# Patient Record
Sex: Male | Born: 1955
Health system: Southern US, Community
[De-identification: ages and names within clinical notes are randomized; demographics above are authoritative.]

## PROBLEM LIST (undated history)

## (undated) DIAGNOSIS — Z95810 Presence of automatic (implantable) cardiac defibrillator: Secondary | ICD-10-CM

## (undated) DIAGNOSIS — I509 Heart failure, unspecified: Secondary | ICD-10-CM

## (undated) DIAGNOSIS — E785 Hyperlipidemia, unspecified: Secondary | ICD-10-CM

## (undated) HISTORY — PX: CARDIAC CATHETERIZATION: SHX172

---

## 2001-02-17 ENCOUNTER — Ambulatory Visit (HOSPITAL_COMMUNITY): Admission: RE | Admit: 2001-02-17 | Discharge: 2001-02-17 | Payer: Self-pay | Admitting: *Deleted

## 2004-06-09 ENCOUNTER — Ambulatory Visit (HOSPITAL_COMMUNITY): Admission: RE | Admit: 2004-06-09 | Discharge: 2004-06-09 | Payer: Self-pay | Admitting: *Deleted

## 2006-03-02 ENCOUNTER — Emergency Department (HOSPITAL_COMMUNITY): Admission: EM | Admit: 2006-03-02 | Discharge: 2006-03-02 | Payer: Self-pay | Admitting: Emergency Medicine

## 2016-06-02 ENCOUNTER — Emergency Department (HOSPITAL_BASED_OUTPATIENT_CLINIC_OR_DEPARTMENT_OTHER)
Admission: EM | Admit: 2016-06-02 | Discharge: 2016-06-02 | Disposition: A | Discharge status: Home / Self Care | Payer: 59 | Attending: Emergency Medicine | Admitting: Emergency Medicine

## 2016-06-02 ENCOUNTER — Encounter (HOSPITAL_BASED_OUTPATIENT_CLINIC_OR_DEPARTMENT_OTHER): Payer: Self-pay | Admitting: *Deleted

## 2016-06-02 ENCOUNTER — Emergency Department (HOSPITAL_BASED_OUTPATIENT_CLINIC_OR_DEPARTMENT_OTHER): Payer: 59

## 2016-06-02 DIAGNOSIS — Y9389 Activity, other specified: Secondary | ICD-10-CM | POA: Insufficient documentation

## 2016-06-02 DIAGNOSIS — W270XXA Contact with workbench tool, initial encounter: Secondary | ICD-10-CM | POA: Insufficient documentation

## 2016-06-02 DIAGNOSIS — Y999 Unspecified external cause status: Secondary | ICD-10-CM | POA: Insufficient documentation

## 2016-06-02 DIAGNOSIS — IMO0002 Reserved for concepts with insufficient information to code with codable children: Secondary | ICD-10-CM

## 2016-06-02 DIAGNOSIS — Z87891 Personal history of nicotine dependence: Secondary | ICD-10-CM | POA: Diagnosis not present

## 2016-06-02 DIAGNOSIS — Y929 Unspecified place or not applicable: Secondary | ICD-10-CM | POA: Insufficient documentation

## 2016-06-02 DIAGNOSIS — S81812A Laceration without foreign body, left lower leg, initial encounter: Secondary | ICD-10-CM | POA: Diagnosis not present

## 2016-06-02 MED ORDER — TETANUS-DIPHTH-ACELL PERTUSSIS 5-2.5-18.5 LF-MCG/0.5 IM SUSP
0.5000 mL | Freq: Once | INTRAMUSCULAR | Status: AC
  Administered 2016-06-02: 0.5 mL via INTRAMUSCULAR
  Filled 2016-06-02: qty 0.5

## 2016-06-02 MED ORDER — "THROMBI-PAD 3""X3"" EX PADS"
MEDICATED_PAD | CUTANEOUS | Status: AC
  Administered 2016-06-02: 1
  Filled 2016-06-02: qty 1

## 2016-06-02 MED ORDER — GELATIN ABSORBABLE 12-7 MM EX MISC
1.0000 | Freq: Once | CUTANEOUS | Status: AC
  Administered 2016-06-02: 1 via TOPICAL

## 2016-06-02 MED ORDER — LIDOCAINE-EPINEPHRINE 2 %-1:100000 IJ SOLN
30.0000 mL | Freq: Once | INTRAMUSCULAR | Status: AC
  Administered 2016-06-02: 30 mL via INTRADERMAL
  Filled 2016-06-02: qty 2

## 2016-06-02 MED ORDER — GELATIN ABSORBABLE 12-7 MM EX MISC
CUTANEOUS | Status: AC
  Administered 2016-06-02: 14:00:00
  Filled 2016-06-02: qty 1

## 2016-06-02 MED ORDER — CEPHALEXIN 500 MG PO CAPS: 500.0000 mg | ORAL_CAPSULE | Freq: Four times a day (QID) | ORAL | Status: DC

## 2016-06-02 NOTE — ED Notes (Signed)
Pt to triage in w/c, bleeding noted on pants, pants removed at triage, active bleeding noted from dressing to left knee. Pt reports "a glancing blow" to his left knee with an ax approx 20 minutes ago, pt unsure of td status. Leg being cleaned, pressure held at triage. Pt denies any other c/o or injuries.

## 2016-06-02 NOTE — ED Notes (Signed)
MD at bedside. 

## 2016-06-02 NOTE — Discharge Instructions (Signed)

## 2016-06-02 NOTE — ED Provider Notes (Signed)
CSN: [id number]     Arrival date & time 06/02/16  1215 History   First MD Initiated Contact with Patient 06/02/16 1255     Chief Complaint  Patient presents with  . Extremity Laceration     (Consider location/radiation/quality/duration/timing/severity/associated sxs/prior Treatment) HPI Kevontae Burgoon is a 60 y.o. male with no significant PMH who presents with laceration to left anterior shin approximately 12 PM today.  Patient was chopping wood with an ax when it slipped and he struck his shin.  No meds PTA.  Tetanus unknown.  Symptom onset sudden, constant.  Bleeding controlled with pressure.  No numbness, weakness, swelling, or color change.   History reviewed. No pertinent past medical history. History reviewed. No pertinent past surgical history. History reviewed. No pertinent family history. Social History  Substance Use Topics  . Smoking status: Former Games developer  . Smokeless tobacco: None  . Alcohol Use: None    Review of Systems All other systems negative unless otherwise stated in HPI    Allergies  Review of patient's allergies indicates no known allergies.  Home Medications   Prior to Admission medications   Medication Sig Start Date End Date Taking? Authorizing Provider  cephALEXin (KEFLEX) 500 MG capsule Take 1 capsule (500 mg total) by mouth 4 (four) times daily. 06/02/16   Raveena Hebdon, PA-C   BP 146/65 mmHg  Pulse 62  Temp(Src) 98.2 F (36.8 C) (Oral)  Resp 18  Ht  (1.803 m)  Wt 99.791 kg  BMI 30.70 kg/m2  SpO2 98% Physical Exam  Constitutional: He is oriented to person, place, and time. He appears well-developed and well-nourished.  HENT:  Head: Normocephalic and atraumatic.  Eyes: Conjunctivae are normal.  Neck: Normal range of motion.  Cardiovascular:  Pulses:      Dorsalis pedis pulses are 2+ on the right side, and 2+ on the left side.  Capillary refill less than 3 seconds distal to injury.   Pulmonary/Chest: Effort normal. No respiratory  distress.  Abdominal: He exhibits no distension.  Musculoskeletal:  3 cm deep laceration to the muscle just below the lateral anterior knee.  Slow pulsatile arterial bleeding that will stop with pressure.  FAROM of LLE.   Neurological: He is alert and oriented to person, place, and time.  Strength and sensation intact distal to injury.  Skin: Skin is warm and dry.   No foreign bodies visualized or palpated in a bloodless field.     ED Course  Procedures (including critical care time)  LACERATION REPAIR Performed by: Cheri Fowler Consent: Verbal consent obtained. Risks and benefits: risks, benefits and alternatives were discussed Patient identity confirmed: provided demographic data Time out performed prior to procedure Prepped and Draped in normal sterile fashion Wound explored Laceration Location: LLE Laceration Length: 3cm No Foreign Bodies seen or palpated Anesthesia: local infiltration Local anesthetic: lidocaine 1% with epinephrine Anesthetic total: 10 ml Irrigation method: syringe Amount of cleaning: standard Skin closure: staple, 5-0 vicryl Number of sutures or staples: 5 staples, 3 deep simple interrupted  Technique: staple Patient tolerance: Patient tolerated the procedure well with no immediate complications.  Labs Review Labs Reviewed - No data to display  Imaging Review Dg Tibia/fibula Left  06/02/2016  CLINICAL DATA:  Patient with injury to the distal left lower extremity with an ax. Initial encounter. EXAM: LEFT TIBIA AND FIBULA - 2 VIEW COMPARISON:  None. FINDINGS: Normal anatomic alignment. No evidence for acute fracture or dislocation. Soft tissue deformity overlying the proximal tibia and fibula. IMPRESSION: No  acute osseous abnormality. Soft tissue deformity overlying the proximal tibia and fibula anteriorly. Electronically Signed   By: Annia Beltrew  Davis M.D.   On: 06/02/2016 15:16   I have personally reviewed and evaluated these images and lab results as part of  my medical decision-making.   EKG Interpretation None      MDM   Final diagnoses:  Laceration  Patient presents with laceration sustained from ax blow to the left lower extremity. Tetanus was updated. Plain films negative for fracture. Neurovascularly intact. Good pulses. Bleeding appeared to be arterial. Wound irrigated.  Patient was seen by Dr. Fredderick PhenixBelfi as well. Dr. Fredderick PhenixBelfi placed 3 deep sutures to control the bleeding. Once bleeding was stopped, 5 staples were placed. No bleeding observed for 10 minutes status post staple placement. Pressure dressing applied. We will discharge home with Keflex. Return in 10-14 days for staple removal. Recommend elevation of lower extremity.  Discussed return precautions including continued bleeding, swelling, pus, fever, chills, or decreased lower extremity sensation.      Cheri FowlerKayla Amor Hyle, PA-C 06/02/16 1707  Rolan BuccoMelanie Belfi, MD 06/03/16 [phone]

## 2019-08-10 ENCOUNTER — Encounter (HOSPITAL_COMMUNITY): Payer: Self-pay

## 2019-08-10 ENCOUNTER — Encounter (HOSPITAL_COMMUNITY): Payer: Self-pay | Admitting: Emergency Medicine

## 2019-08-10 ENCOUNTER — Other Ambulatory Visit: Payer: Self-pay

## 2019-08-10 ENCOUNTER — Inpatient Hospital Stay (HOSPITAL_COMMUNITY)
Admission: EM | Admit: 2019-08-10 | Discharge: 2019-08-17 | DRG: 233 | Disposition: A | Discharge status: Home / Self Care | Payer: Medicare Other | Attending: Thoracic Surgery (Cardiothoracic Vascular Surgery) | Admitting: Thoracic Surgery (Cardiothoracic Vascular Surgery)

## 2019-08-10 ENCOUNTER — Emergency Department (HOSPITAL_COMMUNITY): Payer: Medicare Other

## 2019-08-10 ENCOUNTER — Ambulatory Visit (HOSPITAL_COMMUNITY): Admit: 2019-08-10 | Payer: 59 | Admitting: Internal Medicine

## 2019-08-10 DIAGNOSIS — E118 Type 2 diabetes mellitus with unspecified complications: Secondary | ICD-10-CM | POA: Diagnosis not present

## 2019-08-10 DIAGNOSIS — Z79899 Other long term (current) drug therapy: Secondary | ICD-10-CM

## 2019-08-10 DIAGNOSIS — Z87891 Personal history of nicotine dependence: Secondary | ICD-10-CM

## 2019-08-10 DIAGNOSIS — I110 Hypertensive heart disease with heart failure: Secondary | ICD-10-CM | POA: Diagnosis not present

## 2019-08-10 DIAGNOSIS — E785 Hyperlipidemia, unspecified: Secondary | ICD-10-CM | POA: Diagnosis not present

## 2019-08-10 DIAGNOSIS — D62 Acute posthemorrhagic anemia: Secondary | ICD-10-CM | POA: Diagnosis not present

## 2019-08-10 DIAGNOSIS — I447 Left bundle-branch block, unspecified: Secondary | ICD-10-CM | POA: Diagnosis not present

## 2019-08-10 DIAGNOSIS — E1165 Type 2 diabetes mellitus with hyperglycemia: Secondary | ICD-10-CM | POA: Diagnosis present

## 2019-08-10 DIAGNOSIS — R072 Precordial pain: Secondary | ICD-10-CM

## 2019-08-10 DIAGNOSIS — I5023 Acute on chronic systolic (congestive) heart failure: Secondary | ICD-10-CM | POA: Diagnosis not present

## 2019-08-10 DIAGNOSIS — E7849 Other hyperlipidemia: Secondary | ICD-10-CM

## 2019-08-10 DIAGNOSIS — I5041 Acute combined systolic (congestive) and diastolic (congestive) heart failure: Secondary | ICD-10-CM | POA: Diagnosis not present

## 2019-08-10 DIAGNOSIS — Z01810 Encounter for preprocedural cardiovascular examination: Secondary | ICD-10-CM | POA: Diagnosis not present

## 2019-08-10 DIAGNOSIS — J90 Pleural effusion, not elsewhere classified: Secondary | ICD-10-CM

## 2019-08-10 DIAGNOSIS — E7800 Pure hypercholesterolemia, unspecified: Secondary | ICD-10-CM | POA: Diagnosis present

## 2019-08-10 DIAGNOSIS — I214 Non-ST elevation (NSTEMI) myocardial infarction: Principal | ICD-10-CM | POA: Diagnosis present

## 2019-08-10 DIAGNOSIS — I25110 Atherosclerotic heart disease of native coronary artery with unstable angina pectoris: Secondary | ICD-10-CM | POA: Diagnosis not present

## 2019-08-10 DIAGNOSIS — Z791 Long term (current) use of non-steroidal anti-inflammatories (NSAID): Secondary | ICD-10-CM | POA: Diagnosis not present

## 2019-08-10 DIAGNOSIS — Z951 Presence of aortocoronary bypass graft: Secondary | ICD-10-CM

## 2019-08-10 DIAGNOSIS — I213 ST elevation (STEMI) myocardial infarction of unspecified site: Secondary | ICD-10-CM | POA: Diagnosis not present

## 2019-08-10 DIAGNOSIS — I255 Ischemic cardiomyopathy: Secondary | ICD-10-CM | POA: Diagnosis not present

## 2019-08-10 DIAGNOSIS — Z20828 Contact with and (suspected) exposure to other viral communicable diseases: Secondary | ICD-10-CM | POA: Diagnosis not present

## 2019-08-10 DIAGNOSIS — I25810 Atherosclerosis of coronary artery bypass graft(s) without angina pectoris: Secondary | ICD-10-CM | POA: Diagnosis present

## 2019-08-10 DIAGNOSIS — Z8249 Family history of ischemic heart disease and other diseases of the circulatory system: Secondary | ICD-10-CM

## 2019-08-10 DIAGNOSIS — I10 Essential (primary) hypertension: Secondary | ICD-10-CM | POA: Diagnosis not present

## 2019-08-10 DIAGNOSIS — R9431 Abnormal electrocardiogram [ECG] [EKG]: Secondary | ICD-10-CM

## 2019-08-10 DIAGNOSIS — J9811 Atelectasis: Secondary | ICD-10-CM | POA: Diagnosis not present

## 2019-08-10 DIAGNOSIS — R079 Chest pain, unspecified: Secondary | ICD-10-CM | POA: Diagnosis present

## 2019-08-10 DIAGNOSIS — I2510 Atherosclerotic heart disease of native coronary artery without angina pectoris: Secondary | ICD-10-CM | POA: Diagnosis present

## 2019-08-10 DIAGNOSIS — Z7984 Long term (current) use of oral hypoglycemic drugs: Secondary | ICD-10-CM | POA: Diagnosis not present

## 2019-08-10 DIAGNOSIS — E1169 Type 2 diabetes mellitus with other specified complication: Secondary | ICD-10-CM | POA: Diagnosis not present

## 2019-08-10 HISTORY — DX: Non-ST elevation (NSTEMI) myocardial infarction: I21.4

## 2019-08-10 HISTORY — DX: Hyperlipidemia, unspecified: E78.5

## 2019-08-10 LAB — CBC
HCT: 45.6 % (ref 39.0–52.0)
Hemoglobin: 15.6 g/dL (ref 13.0–17.0)
MCH: 29.7 pg (ref 26.0–34.0)
MCHC: 34.2 g/dL (ref 30.0–36.0)
MCV: 86.9 fL (ref 80.0–100.0)
Platelets: 241 10*3/uL (ref 150–400)
RBC: 5.25 MIL/uL (ref 4.22–5.81)
RDW: 12.4 % (ref 11.5–15.5)
WBC: 8.4 10*3/uL (ref 4.0–10.5)
nRBC: 0 % (ref 0.0–0.2)

## 2019-08-10 LAB — HEMOGLOBIN A1C
Hgb A1c MFr Bld: 8.1 % — ABNORMAL HIGH (ref 4.8–5.6)
Mean Plasma Glucose: 185.77 mg/dL

## 2019-08-10 LAB — BASIC METABOLIC PANEL
Anion gap: 11 (ref 5–15)
BUN: 19 mg/dL (ref 8–23)
CO2: 19 mmol/L — ABNORMAL LOW (ref 22–32)
Calcium: 8.8 mg/dL — ABNORMAL LOW (ref 8.9–10.3)
Chloride: 104 mmol/L (ref 98–111)
Creatinine, Ser: 1 mg/dL (ref 0.61–1.24)
GFR calc Af Amer: >60 mL/min (ref >60)
GFR calc non Af Amer: >60 mL/min (ref >60)
Glucose, Bld: 335 mg/dL — ABNORMAL HIGH (ref 70–99)
Potassium: 5.4 mmol/L — ABNORMAL HIGH (ref 3.5–5.1)
Sodium: 134 mmol/L — ABNORMAL LOW (ref 135–145)

## 2019-08-10 LAB — TROPONIN I (HIGH SENSITIVITY)
Troponin I (High Sensitivity): 1068 ng/L (ref <18)
Troponin I (High Sensitivity): 1161 ng/L (ref <18)
Troponin I (High Sensitivity): 1357 ng/L (ref <18)

## 2019-08-10 LAB — CBG MONITORING, ED: Glucose-Capillary: 157 mg/dL — ABNORMAL HIGH (ref 70–99)

## 2019-08-10 SURGERY — LEFT HEART CATH AND CORONARY ANGIOGRAPHY
Anesthesia: LOCAL

## 2019-08-10 MED ORDER — ASPIRIN 81 MG PO CHEW
81.0000 mg | CHEWABLE_TABLET | ORAL | Status: AC
  Administered 2019-08-11: 07:00:00 81 mg via ORAL
  Filled 2019-08-10: qty 1

## 2019-08-10 MED ORDER — CARVEDILOL 3.125 MG PO TABS
3.1250 mg | ORAL_TABLET | Freq: Two times a day (BID) | ORAL | Status: DC
  Administered 2019-08-10: 3.125 mg via ORAL
  Filled 2019-08-10 (×2): qty 1

## 2019-08-10 MED ORDER — NITROGLYCERIN 0.4 MG SL SUBL: 0.4000 mg | SUBLINGUAL_TABLET | SUBLINGUAL | Status: DC | PRN

## 2019-08-10 MED ORDER — CARVEDILOL 3.125 MG PO TABS
3.1250 mg | ORAL_TABLET | Freq: Two times a day (BID) | ORAL | Status: DC
  Filled 2019-08-10: qty 1

## 2019-08-10 MED ORDER — SODIUM CHLORIDE 0.9 % WEIGHT BASED INFUSION
1.0000 mL/kg/h | INTRAVENOUS | Status: DC
  Administered 2019-08-11: 1 mL/kg/h via INTRAVENOUS

## 2019-08-10 MED ORDER — SODIUM CHLORIDE 0.9% FLUSH
3.0000 mL | Freq: Once | INTRAVENOUS | Status: AC
  Administered 2019-08-10: 16:00:00 3 mL via INTRAVENOUS

## 2019-08-10 MED ORDER — INSULIN ASPART 100 UNIT/ML ~~LOC~~ SOLN: 0.0000 [IU] | Freq: Three times a day (TID) | SUBCUTANEOUS | Status: DC

## 2019-08-10 MED ORDER — HEPARIN (PORCINE) 25000 UT/250ML-% IV SOLN
1700.0000 [IU]/h | INTRAVENOUS | Status: DC
  Administered 2019-08-10: 1300 [IU]/h via INTRAVENOUS
  Filled 2019-08-10 (×2): qty 250

## 2019-08-10 MED ORDER — VERAPAMIL HCL 2.5 MG/ML IV SOLN
INTRAVENOUS | Status: AC
  Filled 2019-08-10: qty 2

## 2019-08-10 MED ORDER — SODIUM CHLORIDE 0.9 % IV SOLN: 250.0000 mL | INTRAVENOUS | Status: DC | PRN

## 2019-08-10 MED ORDER — SODIUM CHLORIDE 0.9% FLUSH: 3.0000 mL | INTRAVENOUS | Status: DC | PRN

## 2019-08-10 MED ORDER — SODIUM CHLORIDE 0.9 % WEIGHT BASED INFUSION
3.0000 mL/kg/h | INTRAVENOUS | Status: DC
  Administered 2019-08-11: 3 mL/kg/h via INTRAVENOUS

## 2019-08-10 MED ORDER — ONDANSETRON HCL 4 MG/2ML IJ SOLN: 4.0000 mg | Freq: Four times a day (QID) | INTRAMUSCULAR | Status: DC | PRN

## 2019-08-10 MED ORDER — ASPIRIN EC 81 MG PO TBEC: 81.0000 mg | DELAYED_RELEASE_TABLET | Freq: Every day | ORAL | Status: DC

## 2019-08-10 MED ORDER — INSULIN ASPART 100 UNIT/ML ~~LOC~~ SOLN
0.0000 [IU] | Freq: Three times a day (TID) | SUBCUTANEOUS | Status: DC
  Administered 2019-08-10: 3 [IU] via SUBCUTANEOUS

## 2019-08-10 MED ORDER — SODIUM CHLORIDE 0.9% FLUSH
3.0000 mL | Freq: Two times a day (BID) | INTRAVENOUS | Status: DC
  Administered 2019-08-10: 22:00:00 3 mL via INTRAVENOUS

## 2019-08-10 MED ORDER — ACETAMINOPHEN 325 MG PO TABS: 650.0000 mg | ORAL_TABLET | ORAL | Status: DC | PRN

## 2019-08-10 MED ORDER — ATORVASTATIN CALCIUM 80 MG PO TABS: 80.0000 mg | ORAL_TABLET | Freq: Every day | ORAL | Status: DC

## 2019-08-10 MED ORDER — HEPARIN (PORCINE) IN NACL 1000-0.9 UT/500ML-% IV SOLN
INTRAVENOUS | Status: AC
  Filled 2019-08-10: qty 1000

## 2019-08-10 MED ORDER — HEPARIN BOLUS VIA INFUSION
4000.0000 [IU] | Freq: Once | INTRAVENOUS | Status: AC
  Administered 2019-08-10: 4000 [IU] via INTRAVENOUS
  Filled 2019-08-10: qty 4000

## 2019-08-10 MED ORDER — LIDOCAINE HCL (PF) 1 % IJ SOLN
INTRAMUSCULAR | Status: AC
  Filled 2019-08-10: qty 30

## 2019-08-10 MED ORDER — ATORVASTATIN CALCIUM 80 MG PO TABS
80.0000 mg | ORAL_TABLET | Freq: Every day | ORAL | Status: DC
  Administered 2019-08-10: 80 mg via ORAL
  Filled 2019-08-10: qty 1

## 2019-08-10 NOTE — Progress Notes (Signed)
Anthony Alvarez arrived to the ED as a STEMI and was down graded. I spoke with [facility] and he said he does not attend church. His wife was on the way to the ED. Samit declined spiritual care at that time.   Wife: [name] Eunice Resident Evelene Croon M.Div. Pager # [phone]

## 2019-08-10 NOTE — ED Triage Notes (Signed)
Pt arrives to ED from Urgent Care with [facility] as a Code STEMI with complaints of on going chest pain for the last couple of months. Patients EKG showed inferior elevation and CODE STEMI was called. On arrival to ED Trauma A patient was chest pain free with no complaints. Patient received 324 Asprin and x1 nitro.

## 2019-08-10 NOTE — Progress Notes (Signed)
ANTICOAGULATION CONSULT NOTE - Initial Consult  Pharmacy Consult for Heparin Indication: chest pain/ACS  No Known Allergies  Patient Measurements: Weight: 225 lb (102.1 kg)   Vital Signs: Temp: 99.6 F (37.6 C) (08/24 1547) Temp Source: Oral (08/24 1547) BP: 136/75 (08/24 1600) Pulse Rate: 91 (08/24 1600)  Labs: Recent Labs    08/10/19 1550  HGB 15.6  HCT 45.6  PLT 241  CREATININE 1.00  TROPONINIHS 1,068*    CrCl cannot be calculated (Unknown ideal weight.).   Medical History: History reviewed. No pertinent past medical history.  Assessment: 63 year old patient with a history of hypercholesterolemia presents for evaluation of chest pain. Initial onset of pain was less than one hour ago.  Pharmacy consulted to start heparin drip.  Patient was not on any anticoagulation PTA.  Goal of Therapy:  Heparin level 0.3-0.7 units/ml Monitor platelets by anticoagulation protocol: Yes   Plan:  Give 4000 units bolus x 1 Start heparin infusion at 1300 units/hr Check anti-Xa level in 6 hours and daily while on heparin Continue to monitor H&H and platelets  Alanda Slim, PharmD, [facility] Clinical Pharmacist Please see AMION for all Pharmacists' Contact Phone Numbers 08/10/2019, 5:15 PM

## 2019-08-10 NOTE — ED Notes (Signed)
MD Ellyn Hack, Cardiologist arrived into room
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
## 2019-08-11 ENCOUNTER — Encounter (HOSPITAL_COMMUNITY): Payer: Self-pay | Admitting: Cardiology

## 2019-08-11 ENCOUNTER — Other Ambulatory Visit: Payer: Self-pay | Admitting: *Deleted

## 2019-08-11 ENCOUNTER — Encounter (HOSPITAL_COMMUNITY)
Admission: EM | Disposition: A | Discharge status: Home / Self Care | Payer: Self-pay | Source: Home / Self Care | Attending: Cardiology

## 2019-08-11 ENCOUNTER — Inpatient Hospital Stay (HOSPITAL_COMMUNITY): Payer: Medicare Other

## 2019-08-11 DIAGNOSIS — I2510 Atherosclerotic heart disease of native coronary artery without angina pectoris: Secondary | ICD-10-CM

## 2019-08-11 DIAGNOSIS — I213 ST elevation (STEMI) myocardial infarction of unspecified site: Secondary | ICD-10-CM

## 2019-08-11 DIAGNOSIS — I25110 Atherosclerotic heart disease of native coronary artery with unstable angina pectoris: Secondary | ICD-10-CM

## 2019-08-11 HISTORY — PX: TRANSTHORACIC ECHOCARDIOGRAM: SHX275

## 2019-08-11 HISTORY — PX: LEFT HEART CATH AND CORONARY ANGIOGRAPHY: CATH118249

## 2019-08-11 LAB — SARS CORONAVIRUS 2 (TAT 6-24 HRS): SARS Coronavirus 2: NEGATIVE

## 2019-08-11 LAB — BASIC METABOLIC PANEL
Anion gap: 7 (ref 5–15)
BUN: 16 mg/dL (ref 8–23)
CO2: 22 mmol/L (ref 22–32)
Calcium: 8.5 mg/dL — ABNORMAL LOW (ref 8.9–10.3)
Chloride: 107 mmol/L (ref 98–111)
Creatinine, Ser: 1.03 mg/dL (ref 0.61–1.24)
GFR calc Af Amer: >60 mL/min (ref >60)
GFR calc non Af Amer: >60 mL/min (ref >60)
Glucose, Bld: 168 mg/dL — ABNORMAL HIGH (ref 70–99)
Potassium: 4 mmol/L (ref 3.5–5.1)
Sodium: 136 mmol/L (ref 135–145)

## 2019-08-11 LAB — CBC
HCT: 44 % (ref 39.0–52.0)
Hemoglobin: 14.9 g/dL (ref 13.0–17.0)
MCH: 30 pg (ref 26.0–34.0)
MCHC: 33.9 g/dL (ref 30.0–36.0)
MCV: 88.5 fL (ref 80.0–100.0)
Platelets: 258 10*3/uL (ref 150–400)
RBC: 4.97 MIL/uL (ref 4.22–5.81)
RDW: 12.5 % (ref 11.5–15.5)
WBC: 10.4 10*3/uL (ref 4.0–10.5)
nRBC: 0 % (ref 0.0–0.2)

## 2019-08-11 LAB — HIV ANTIBODY (ROUTINE TESTING W REFLEX): HIV Screen 4th Generation wRfx: NONREACTIVE

## 2019-08-11 LAB — ECHOCARDIOGRAM COMPLETE
Height: 71 in
Weight: 3601.43 oz

## 2019-08-11 LAB — LIPID PANEL
Cholesterol: 124 mg/dL (ref 0–200)
HDL: 21 mg/dL — ABNORMAL LOW (ref >40)
LDL Cholesterol: 54 mg/dL (ref 0–99)
Total CHOL/HDL Ratio: 5.9 RATIO
Triglycerides: 246 mg/dL — ABNORMAL HIGH (ref <150)
VLDL: 49 mg/dL — ABNORMAL HIGH (ref 0–40)

## 2019-08-11 LAB — HEPARIN LEVEL (UNFRACTIONATED): Heparin Unfractionated: 0.1 IU/mL — ABNORMAL LOW (ref 0.30–0.70)

## 2019-08-11 LAB — GLUCOSE, CAPILLARY
Glucose-Capillary: 161 mg/dL — ABNORMAL HIGH (ref 70–99)
Glucose-Capillary: 132 mg/dL — ABNORMAL HIGH (ref 70–99)
Glucose-Capillary: 142 mg/dL — ABNORMAL HIGH (ref 70–99)
Glucose-Capillary: 159 mg/dL — ABNORMAL HIGH (ref 70–99)
Glucose-Capillary: 120 mg/dL — ABNORMAL HIGH (ref 70–99)

## 2019-08-11 LAB — TROPONIN I (HIGH SENSITIVITY): Troponin I (High Sensitivity): 1365 ng/L (ref <18)

## 2019-08-11 LAB — NOVEL CORONAVIRUS, NAA (HOSP ORDER, SEND-OUT TO REF LAB; TAT 18-24 HRS): SARS-CoV-2, NAA: NOT DETECTED

## 2019-08-11 SURGERY — LEFT HEART CATH AND CORONARY ANGIOGRAPHY
Anesthesia: LOCAL

## 2019-08-11 MED ORDER — SODIUM CHLORIDE 0.9 % WEIGHT BASED INFUSION: 1.0000 mL/kg/h | INTRAVENOUS | Status: AC

## 2019-08-11 MED ORDER — SODIUM CHLORIDE 0.9% FLUSH
3.0000 mL | Freq: Two times a day (BID) | INTRAVENOUS | Status: DC
  Administered 2019-08-12: 22:00:00 3 mL via INTRAVENOUS

## 2019-08-11 MED ORDER — LIVING WELL WITH DIABETES BOOK
Freq: Once | Status: DC
  Filled 2019-08-11: qty 1

## 2019-08-11 MED ORDER — IOHEXOL 350 MG/ML SOLN
INTRAVENOUS | Status: DC | PRN
  Administered 2019-08-11: 08:00:00 90 mL via INTRA_ARTERIAL

## 2019-08-11 MED ORDER — LIDOCAINE HCL (PF) 1 % IJ SOLN
INTRAMUSCULAR | Status: DC | PRN
  Administered 2019-08-11: 2 mL

## 2019-08-11 MED ORDER — HEPARIN (PORCINE) IN NACL 1000-0.9 UT/500ML-% IV SOLN
INTRAVENOUS | Status: AC
  Filled 2019-08-11: qty 1000

## 2019-08-11 MED ORDER — HEPARIN (PORCINE) IN NACL 1000-0.9 UT/500ML-% IV SOLN
INTRAVENOUS | Status: DC | PRN
  Administered 2019-08-11 (×2): 500 mL

## 2019-08-11 MED ORDER — METOPROLOL TARTRATE 12.5 MG HALF TABLET
12.5000 mg | ORAL_TABLET | Freq: Two times a day (BID) | ORAL | Status: DC
  Administered 2019-08-11 – 2019-08-12 (×4): 12.5 mg via ORAL
  Filled 2019-08-11 (×4): qty 1

## 2019-08-11 MED ORDER — VERAPAMIL HCL 2.5 MG/ML IV SOLN
INTRAVENOUS | Status: AC
  Filled 2019-08-11: qty 2

## 2019-08-11 MED ORDER — MIDAZOLAM HCL 2 MG/2ML IJ SOLN
INTRAMUSCULAR | Status: AC
  Filled 2019-08-11: qty 2

## 2019-08-11 MED ORDER — FENTANYL CITRATE (PF) 100 MCG/2ML IJ SOLN
INTRAMUSCULAR | Status: AC
  Filled 2019-08-11: qty 2

## 2019-08-11 MED ORDER — VERAPAMIL HCL 2.5 MG/ML IV SOLN
INTRAVENOUS | Status: DC | PRN
  Administered 2019-08-11: 08:00:00 10 mL via INTRA_ARTERIAL

## 2019-08-11 MED ORDER — SODIUM CHLORIDE 0.9% FLUSH: 3.0000 mL | INTRAVENOUS | Status: DC | PRN

## 2019-08-11 MED ORDER — ACETAMINOPHEN 325 MG PO TABS
650.0000 mg | ORAL_TABLET | ORAL | Status: DC | PRN
  Administered 2019-08-11 – 2019-08-12 (×2): 650 mg via ORAL
  Filled 2019-08-11 (×2): qty 2

## 2019-08-11 MED ORDER — INSULIN ASPART 100 UNIT/ML ~~LOC~~ SOLN
0.0000 [IU] | Freq: Three times a day (TID) | SUBCUTANEOUS | Status: DC
  Administered 2019-08-11: 2 [IU] via SUBCUTANEOUS
  Administered 2019-08-11: 3 [IU] via SUBCUTANEOUS
  Administered 2019-08-12: 2 [IU] via SUBCUTANEOUS

## 2019-08-11 MED ORDER — ONDANSETRON HCL 4 MG/2ML IJ SOLN: 4.0000 mg | Freq: Four times a day (QID) | INTRAMUSCULAR | Status: DC | PRN

## 2019-08-11 MED ORDER — HEPARIN SODIUM (PORCINE) 1000 UNIT/ML IJ SOLN
INTRAMUSCULAR | Status: DC | PRN
  Administered 2019-08-11: 5000 [IU] via INTRAVENOUS

## 2019-08-11 MED ORDER — ATORVASTATIN CALCIUM 80 MG PO TABS
80.0000 mg | ORAL_TABLET | Freq: Every day | ORAL | Status: DC
  Administered 2019-08-11 – 2019-08-16 (×5): 80 mg via ORAL
  Filled 2019-08-11 (×5): qty 1

## 2019-08-11 MED ORDER — ASPIRIN 81 MG PO CHEW
81.0000 mg | CHEWABLE_TABLET | Freq: Every day | ORAL | Status: DC
  Administered 2019-08-12: 81 mg via ORAL
  Filled 2019-08-11: qty 1

## 2019-08-11 MED ORDER — HEPARIN BOLUS VIA INFUSION
3000.0000 [IU] | Freq: Once | INTRAVENOUS | Status: AC
  Administered 2019-08-11: 3000 [IU] via INTRAVENOUS
  Filled 2019-08-11: qty 3000

## 2019-08-11 MED ORDER — LIDOCAINE HCL (PF) 1 % IJ SOLN
INTRAMUSCULAR | Status: AC
  Filled 2019-08-11: qty 30

## 2019-08-11 MED ORDER — HEPARIN (PORCINE) 25000 UT/250ML-% IV SOLN
2000.0000 [IU]/h | INTRAVENOUS | Status: DC
  Administered 2019-08-11: 1800 [IU]/h via INTRAVENOUS
  Administered 2019-08-12 (×2): 2000 [IU]/h via INTRAVENOUS
  Filled 2019-08-11 (×4): qty 250

## 2019-08-11 MED ORDER — FENTANYL CITRATE (PF) 100 MCG/2ML IJ SOLN
INTRAMUSCULAR | Status: DC | PRN
  Administered 2019-08-11 (×2): 25 ug via INTRAVENOUS

## 2019-08-11 MED ORDER — FAMOTIDINE 20 MG PO TABS
20.0000 mg | ORAL_TABLET | Freq: Once | ORAL | Status: AC
  Administered 2019-08-11: 20 mg via ORAL
  Filled 2019-08-11: qty 1

## 2019-08-11 MED ORDER — HEPARIN SODIUM (PORCINE) 1000 UNIT/ML IJ SOLN
INTRAMUSCULAR | Status: AC
  Filled 2019-08-11: qty 1

## 2019-08-11 MED ORDER — INSULIN ASPART 100 UNIT/ML ~~LOC~~ SOLN: 0.0000 [IU] | Freq: Three times a day (TID) | SUBCUTANEOUS | Status: DC

## 2019-08-11 MED ORDER — MIDAZOLAM HCL 2 MG/2ML IJ SOLN
INTRAMUSCULAR | Status: DC | PRN
  Administered 2019-08-11 (×2): 1 mg via INTRAVENOUS

## 2019-08-11 MED ORDER — SODIUM CHLORIDE 0.9 % IV SOLN: 250.0000 mL | INTRAVENOUS | Status: DC | PRN

## 2019-08-11 SURGICAL SUPPLY — 10 items
CATH 5FR JL3.5 JR4 ANG PIG MP (CATHETERS) ×2 IMPLANT
DEVICE RAD COMP TR BAND LRG (VASCULAR PRODUCTS) ×2 IMPLANT
GUIDEWIRE INQWIRE 1.5J.035X260 (WIRE) ×1 IMPLANT
INQWIRE 1.5J .035X260CM (WIRE) ×2
KIT HEART LEFT (KITS) ×2 IMPLANT
PACK CARDIAC CATHETERIZATION (CUSTOM PROCEDURE TRAY) ×2 IMPLANT
SHEATH RAIN RADIAL 21G 6FR (SHEATH) ×2 IMPLANT
SYR MEDRAD MARK 7 150ML (SYRINGE) ×2 IMPLANT
TRANSDUCER W/STOPCOCK (MISCELLANEOUS) ×2 IMPLANT
TUBING CIL FLEX 10 FLL-RA (TUBING) ×2 IMPLANT

## 2019-08-11 NOTE — Progress Notes (Signed)
TCTS consulted for CABG evaluation. °

## 2019-08-11 NOTE — ED Notes (Signed)
Patient denies pain and is resting comfortably.  

## 2019-08-11 NOTE — Consult Note (Signed)
[address]       [place] [id number]             [phone]        [facility] Medical Record #[id number] Date of Birth: [birth year]  Referring: No ref. provider found Primary Care: Daisy Florooss, Charles Alan, MD Primary Cardiologist:No primary care provider on file.  Chief Complaint:    Chief Complaint  Patient presents with  . Code STEMI    History of Present Illness:     63 yo male admitted from the ED with chest pain.  He underwent a LHC which shows 2V CAD.  CTS was consulted to assist with management.  He began noticing symptoms in January of this year.  He has had progressive dyspnea with exertion over the last several months.  His first episode of chest pain was about 3-4 weeks ago.     Past Medical and Surgical History: Previous Chest Surgery: no Previous Chest Radiation: no Diabetes Mellitus: yes.  HbA1C 8.1 Creatinine: 1.03  Past Medical History:  Diagnosis Date  . HLD (hyperlipidemia)     Past Surgical History:  Procedure Laterality Date  . LEFT HEART CATH AND CORONARY ANGIOGRAPHY N/A 08/11/2019   Procedure: LEFT HEART CATH AND CORONARY ANGIOGRAPHY;  Surgeon: SwazilandJordan, [name], MD;  [facility];  Service: Cardiovascular;  Laterality: N/A;    Social History: Support: lives with wife.  Active lifestyle  Social History   Tobacco Use  Smoking Status Former Smoker    Social History   Substance and Sexual Activity  Alcohol Use None     No Known Allergies  Medications: Asprin: yes Statin: yes Beta Blocker: yes Ace Inhibitor: no Anti-Coagulation: no  Current Facility-Administered Medications  Medication Dose Route Frequency Provider Last Rate Last Dose  . 0.9 %  sodium chloride infusion  250 mL Intravenous PRN SwazilandJordan, [name], MD      . 0.9% sodium chloride infusion  1 mL/kg/hr Intravenous Continuous SwazilandJordan, [name], MD      . acetaminophen (TYLENOL) tablet 650 mg  650 mg Oral Q4H PRN SwazilandJordan, [name],  MD      . aspirin chewable tablet 81 mg  81 mg Oral Daily SwazilandJordan, [name], MD      . atorvastatin (LIPITOR) tablet 80 mg  80 mg Oral q1800 SwazilandJordan, [name], MD      . heparin ADULT infusion 100 units/mL (25000 units/23650mL sodium chloride 0.45%)  1,800 Units/hr Intravenous Continuous Marykay LexHarding, David W, MD      . living well with diabetes book MISC   Does not apply Once Marykay LexHarding, David W, MD      . metoprolol tartrate (LOPRESSOR) tablet 12.5 mg  12.5 mg Oral BID SwazilandJordan, [name], MD      . ondansetron [facility](ZOFRAN) injection 4 mg  4 mg Intravenous Q6H PRN SwazilandJordan, [name], MD      . sodium chloride flush (NS) 0.9 % injection 3 mL  3 mL Intravenous Q12H SwazilandJordan, [name], MD      . sodium chloride flush (NS) 0.9 % injection 3 mL  3 mL Intravenous PRN SwazilandJordan, [name], MD        Medications Prior to Admission  Medication Sig Dispense Refill Last Dose  . acetaminophen (TYLENOL) 500 MG tablet Take 500-1,000 mg by mouth every 8 (eight) hours as needed (for pain).    08/10/2019 at am  . ibuprofen (ADVIL) 200 MG tablet  Take 200-400 mg by mouth every 8 (eight) hours as needed (for pain).    08/09/2019 at Unknown time    History reviewed. No pertinent family history.   Review of Systems:   Review of Systems  Constitutional: Positive for malaise/fatigue. Negative for chills, fever and weight loss.  HENT: Negative.   Eyes: Negative.   Respiratory: Positive for shortness of breath. Negative for cough and hemoptysis.   Cardiovascular: Positive for chest pain. Negative for palpitations, orthopnea, claudication, leg swelling and PND.  Gastrointestinal: Positive for diarrhea and heartburn.  Musculoskeletal: Positive for myalgias.  Skin: Negative.   Neurological: Negative.       Physical Exam: BP 126/75 (BP Location: Left Arm)   Pulse 61   Temp 98.5 F (36.9 C) (Oral)   Resp 10   Ht 5\' 11"  (1.803 m)   Wt 102.1 kg   SpO2 98%   BMI 31.39 kg/m  Physical Exam  Constitutional: He is oriented to person,  place, and time and well-developed, well-nourished, and in no distress. No distress.  HENT:  Head: Normocephalic and atraumatic.  Eyes: Conjunctivae and EOM are normal. No scleral icterus.  Neck: Normal range of motion. No tracheal deviation present.  Cardiovascular: Normal rate and regular rhythm.  No murmur heard. Pulmonary/Chest: Effort normal and breath sounds normal. No respiratory distress.  Abdominal: Soft. He exhibits no distension.  Musculoskeletal: Normal range of motion.        General: No edema.  Neurological: He is alert and oriented to person, place, and time.  Skin: Skin is warm and dry. He is not diaphoretic.      Diagnostic Studies & Laboratory data:    Left Heart Catherization:  Diagnostic Dominance: Right   CTO LAD, with LL colaterals Mid RCA and PLV lesion  Echo: pending   I have independently reviewed the above radiologic studies and discussed with the patient   Recent Lab Findings: Lab Results  Component Value Date   WBC 10.4 08/11/2019   HGB 14.9 08/11/2019   HCT 44.0 08/11/2019   PLT 258 08/11/2019   GLUCOSE 168 (H) 08/11/2019   CHOL 124 08/11/2019   TRIG 246 (H) 08/11/2019   HDL 21 (L) 08/11/2019   LDLCALC 54 08/11/2019   NA 136 08/11/2019   K 4.0 08/11/2019   CL 107 08/11/2019   CREATININE 1.03 08/11/2019   BUN 16 08/11/2019   CO2 22 08/11/2019   HGBA1C 8.1 (H) 08/10/2019      Assessment / Plan:   63 yo male with 2V CAD NSTEMI.  EF 40%.  No significant valvular disease  Will plan for surgery on 8/27.  CABG 2-3.  Risks, benefits, and alternatives discussed.       I  spent 30 minutes counseling the patient face to face.   [name] 08/11/2019 12:02 PM

## 2019-08-11 NOTE — ED Notes (Signed)
Pt placed in hospital bed

## 2019-08-11 NOTE — Interval H&P Note (Signed)
History and Physical Interval Note:  08/11/2019 7:56 AM  Anthony Alvarez  has presented today for surgery, with the diagnosis of n stemi.  The various methods of treatment have been discussed with the patient and family. After consideration of risks, benefits and other options for treatment, the patient has consented to  Procedure(s): LEFT HEART CATH AND CORONARY ANGIOGRAPHY (N/A) as a surgical intervention.  The patient's history has been reviewed, patient examined, no change in status, stable for surgery.  I have reviewed the patient's chart and labs.  Questions were answered to the patient's satisfaction.   Cath Lab Visit (complete for each Cath Lab visit)  Clinical Evaluation Leading to the Procedure:   ACS: Yes.    Non-ACS:    Anginal Classification: CCS IV  Anti-ischemic medical therapy: No Therapy  Non-Invasive Test Results: No non-invasive testing performed  Prior CABG: No previous CABG        Saiya Crist [name] MD,FACC 08/11/2019 7:57 AM

## 2019-08-11 NOTE — Progress Notes (Signed)
  Echocardiogram 2D Echocardiogram has been performed.  Anthony Alvarez M 08/11/2019, 12:46 PM

## 2019-08-11 NOTE — Progress Notes (Signed)
ANTICOAGULATION CONSULT NOTE - Follow Up Consult  Pharmacy Consult for heparin Indication: NSTEMI  Labs: Recent Labs    08/10/19 1550 08/10/19 1750 08/10/19 2221 08/11/19 0016 08/11/19 0421  HGB 15.6  --   --   --  14.9  HCT 45.6  --   --   --  44.0  PLT 241  --   --   --  258  HEPARINUNFRC  --   --   --  0.10*  --   CREATININE 1.00  --   --   --  1.03  TROPONINIHS 1,068* 1,161* 1,357* 1,365*  --     Assessment: 63yo male s/p cath to resume heparin 8 hours post sheath removal CABG consult planned  Goal of Therapy:  Heparin level 0.3-0.7 units/ml   Plan:  Heparin at 1800 units / hr starting at 16:30 pm 8 hour heparin  Daily heparin level, CBC  Thank you Anette Guarneri, PharmD [phone] 08/11/2019,9:16 AM

## 2019-08-11 NOTE — Progress Notes (Signed)
ANTICOAGULATION CONSULT NOTE - Follow Up Consult  Pharmacy Consult for heparin Indication: NSTEMI  Labs: Recent Labs    08/10/19 1550 08/10/19 1750 08/10/19 2221 08/11/19 0016  HGB 15.6  --   --   --   HCT 45.6  --   --   --   PLT 241  --   --   --   HEPARINUNFRC  --   --   --  0.10*  CREATININE 1.00  --   --   --   TROPONINIHS 1,068* 1,161* 1,357*  --     Assessment: 63yo male subtherapeutic on heparin with initial dosing for NSTEMI; no gtt issues or signs of bleeding per RN.  Goal of Therapy:  Heparin level 0.3-0.7 units/ml   Plan:  Will rebolus with heparin 3000 units and increase heparin gtt by 4 units/kg/hr to 1700 units/hr and check level in 6 hours.    Wynona Neat, PharmD, BCPS  08/11/2019,12:56 AM

## 2019-08-11 NOTE — ED Notes (Signed)
Paged Dr Ellyn Hack @ 5:35 am

## 2019-08-11 NOTE — ED Notes (Signed)
Pt asked for something for his "heart burn".  Pt states this is the same heart burn he has several times a week.  Nothing ordered for indigestion.  Paged provider to request orders.

## 2019-08-11 NOTE — Progress Notes (Addendum)
Inpatient Diabetes Program Recommendations  AACE/ADA: New Consensus Statement on Inpatient Glycemic Control (2015)  Target Ranges:  Prepandial:   less than 140 mg/dL      Peak postprandial:   less than 180 mg/dL (1-2 hours)      Critically ill patients:  140 - 180 mg/dL   Lab Results  Component Value Date   GLUCAP 161 (H) 08/11/2019   HGBA1C 8.1 (H) 08/10/2019    Review of Glycemic Control  Diabetes history: None, New Diagnosis this admission  Current orders for Inpatient glycemic control: None  Inpatient Diabetes Program Recommendations:    A1c 8.1% meets criteria for New Diabetes Diagnosis. Will speak with patient today. Will likely benefit from SGLT 2 on discharge will need CM for benefits check on what is covered by insurance.  Will speak with patient today.  Consider Novolog 0-9 units tid + Novolog 0-5 units qhs for glucose control prior to surgery.  Addendum 1200 pm  Spoke with patient and wife at bedside regarding New DM diagnosis and A1c level of 8.1%. Discussed A1C results and explained what an A1C is. Discussed basic pathophysiology of DM Type 2, basic home care, importance of checking CBGs and maintaining good CBG control to prevent long-term and short-term complications. Briefly discussed impact of nutrition, exercise, stress, sickness, and medications on diabetes control.   Asked patient to check his glucose 2 times per day (fasting and alternating second check) and to keep a log book of glucose readings and insulin taken. Explained how the doctor he follows up with can use the log book to continue to make medication adjustments if needed.   RNs to provide ongoing basic DM education at bedside with this patient and engage patient to actively check blood glucose after surgery.   Will follow patient and see again closer to discharge.  Thanks,  Tama Headings RN, MSN, BC-ADM Inpatient Diabetes Coordinator Team Pager [phone] (8a-5p)

## 2019-08-12 ENCOUNTER — Inpatient Hospital Stay (HOSPITAL_COMMUNITY): Payer: Medicare Other

## 2019-08-12 DIAGNOSIS — I25110 Atherosclerotic heart disease of native coronary artery with unstable angina pectoris: Secondary | ICD-10-CM

## 2019-08-12 DIAGNOSIS — I255 Ischemic cardiomyopathy: Secondary | ICD-10-CM

## 2019-08-12 DIAGNOSIS — E118 Type 2 diabetes mellitus with unspecified complications: Secondary | ICD-10-CM

## 2019-08-12 DIAGNOSIS — I25810 Atherosclerosis of coronary artery bypass graft(s) without angina pectoris: Secondary | ICD-10-CM

## 2019-08-12 DIAGNOSIS — I5041 Acute combined systolic (congestive) and diastolic (congestive) heart failure: Secondary | ICD-10-CM

## 2019-08-12 DIAGNOSIS — Z01810 Encounter for preprocedural cardiovascular examination: Secondary | ICD-10-CM

## 2019-08-12 DIAGNOSIS — E785 Hyperlipidemia, unspecified: Secondary | ICD-10-CM

## 2019-08-12 DIAGNOSIS — E1169 Type 2 diabetes mellitus with other specified complication: Secondary | ICD-10-CM

## 2019-08-12 DIAGNOSIS — I2510 Atherosclerotic heart disease of native coronary artery without angina pectoris: Secondary | ICD-10-CM | POA: Diagnosis present

## 2019-08-12 HISTORY — DX: Atherosclerosis of coronary artery bypass graft(s) without angina pectoris: I25.810

## 2019-08-12 HISTORY — DX: Atherosclerotic heart disease of native coronary artery with unstable angina pectoris: I25.110

## 2019-08-12 LAB — PULMONARY FUNCTION TEST
DL/VA % pred: 87 %
DL/VA: 3.6 ml/min/mmHg/L
DLCO cor % pred: 73 %
DLCO cor: 19.78 ml/min/mmHg
DLCO unc % pred: 76 %
DLCO unc: 20.36 ml/min/mmHg
FEF 25-75 Post: 4.57 L/sec
FEF 25-75 Pre: 3.09 L/sec
FEF2575-%Change-Post: 48 %
FEF2575-%Pred-Post: 167 %
FEF2575-%Pred-Pre: 113 %
FEV1-%Change-Post: 4 %
FEV1-%Pred-Post: 95 %
FEV1-%Pred-Pre: 90 %
FEV1-Post: 3.27 L
FEV1-Pre: 3.12 L
FEV1FVC-%Change-Post: 6 %
FEV1FVC-%Pred-Pre: 110 %
FEV6-%Change-Post: -1 %
FEV6-%Pred-Post: 84 %
FEV6-%Pred-Pre: 86 %
FEV6-Post: 3.71 L
FEV6-Pre: 3.79 L
FEV6FVC-%Pred-Post: 105 %
FEV6FVC-%Pred-Pre: 105 %
FVC-%Change-Post: -1 %
FVC-%Pred-Post: 80 %
FVC-%Pred-Pre: 82 %
FVC-Post: 3.71 L
FVC-Pre: 3.79 L
Post FEV1/FVC ratio: 88 %
Post FEV6/FVC ratio: 100 %
Pre FEV1/FVC ratio: 82 %
Pre FEV6/FVC Ratio: 100 %
RV % pred: 74 %
RV: 1.74 L
TLC % pred: 82 %
TLC: 5.81 L

## 2019-08-12 LAB — HEPARIN LEVEL (UNFRACTIONATED)
Heparin Unfractionated: 0.26 IU/mL — ABNORMAL LOW (ref 0.30–0.70)
Heparin Unfractionated: 0.51 IU/mL (ref 0.30–0.70)

## 2019-08-12 LAB — CBC
HCT: 44.8 % (ref 39.0–52.0)
Hemoglobin: 15.7 g/dL (ref 13.0–17.0)
MCH: 30.3 pg (ref 26.0–34.0)
MCHC: 35 g/dL (ref 30.0–36.0)
MCV: 86.3 fL (ref 80.0–100.0)
Platelets: 229 10*3/uL (ref 150–400)
RBC: 5.19 MIL/uL (ref 4.22–5.81)
RDW: 12.5 % (ref 11.5–15.5)
WBC: 9.2 10*3/uL (ref 4.0–10.5)
nRBC: 0 % (ref 0.0–0.2)

## 2019-08-12 LAB — ABO/RH: ABO/RH(D): B POS

## 2019-08-12 LAB — PREPARE RBC (CROSSMATCH)

## 2019-08-12 LAB — GLUCOSE, CAPILLARY
Glucose-Capillary: 119 mg/dL — ABNORMAL HIGH (ref 70–99)
Glucose-Capillary: 137 mg/dL — ABNORMAL HIGH (ref 70–99)
Glucose-Capillary: 114 mg/dL — ABNORMAL HIGH (ref 70–99)
Glucose-Capillary: 159 mg/dL — ABNORMAL HIGH (ref 70–99)

## 2019-08-12 MED ORDER — NOREPINEPHRINE 4 MG/250ML-% IV SOLN
0.0000 ug/min | INTRAVENOUS | Status: DC
  Filled 2019-08-12: qty 250

## 2019-08-12 MED ORDER — METOPROLOL TARTRATE 12.5 MG HALF TABLET
12.5000 mg | ORAL_TABLET | Freq: Once | ORAL | Status: AC
  Administered 2019-08-13: 06:00:00 12.5 mg via ORAL
  Filled 2019-08-12: qty 1

## 2019-08-12 MED ORDER — TRANEXAMIC ACID (OHS) PUMP PRIME SOLUTION
2.0000 mg/kg | INTRAVENOUS | Status: DC
  Filled 2019-08-12: qty 2.02

## 2019-08-12 MED ORDER — ALBUTEROL SULFATE (2.5 MG/3ML) 0.083% IN NEBU
2.5000 mg | INHALATION_SOLUTION | Freq: Once | RESPIRATORY_TRACT | Status: AC
  Administered 2019-08-12: 2.5 mg via RESPIRATORY_TRACT

## 2019-08-12 MED ORDER — VANCOMYCIN HCL 10 G IV SOLR
1500.0000 mg | INTRAVENOUS | Status: AC
  Administered 2019-08-13: 1500 mg via INTRAVENOUS
  Filled 2019-08-12: qty 1500

## 2019-08-12 MED ORDER — CHLORHEXIDINE GLUCONATE 0.12 % MT SOLN
15.0000 mL | Freq: Once | OROMUCOSAL | Status: AC
  Administered 2019-08-13: 06:00:00 15 mL via OROMUCOSAL
  Filled 2019-08-12: qty 15

## 2019-08-12 MED ORDER — TRANEXAMIC ACID (OHS) BOLUS VIA INFUSION
15.0000 mg/kg | INTRAVENOUS | Status: DC
  Filled 2019-08-12: qty 1514

## 2019-08-12 MED ORDER — MILRINONE LACTATE IN DEXTROSE 20-5 MG/100ML-% IV SOLN
0.3000 ug/kg/min | INTRAVENOUS | Status: DC
  Filled 2019-08-12: qty 100

## 2019-08-12 MED ORDER — POTASSIUM CHLORIDE 2 MEQ/ML IV SOLN
80.0000 meq | INTRAVENOUS | Status: DC
  Filled 2019-08-12: qty 40

## 2019-08-12 MED ORDER — CHLORHEXIDINE GLUCONATE CLOTH 2 % EX PADS
6.0000 | MEDICATED_PAD | Freq: Once | CUTANEOUS | Status: AC
  Administered 2019-08-12: 6 via TOPICAL

## 2019-08-12 MED ORDER — TEMAZEPAM 15 MG PO CAPS
15.0000 mg | ORAL_CAPSULE | Freq: Once | ORAL | Status: AC | PRN
  Administered 2019-08-12: 22:00:00 15 mg via ORAL
  Filled 2019-08-12: qty 1

## 2019-08-12 MED ORDER — SODIUM CHLORIDE 0.9 % IV SOLN
1.5000 g | INTRAVENOUS | Status: AC
  Administered 2019-08-13: 08:00:00 1.5 g via INTRAVENOUS
  Administered 2019-08-13: 12:00:00 .75 g via INTRAVENOUS
  Filled 2019-08-12: qty 1.5

## 2019-08-12 MED ORDER — EPINEPHRINE HCL 5 MG/250ML IV SOLN IN NS
0.0000 ug/min | INTRAVENOUS | Status: DC
  Filled 2019-08-12: qty 250

## 2019-08-12 MED ORDER — DOPAMINE-DEXTROSE 3.2-5 MG/ML-% IV SOLN
0.0000 ug/kg/min | INTRAVENOUS | Status: DC
  Filled 2019-08-12: qty 250

## 2019-08-12 MED ORDER — CHLORHEXIDINE GLUCONATE CLOTH 2 % EX PADS
6.0000 | MEDICATED_PAD | Freq: Once | CUTANEOUS | Status: AC
  Administered 2019-08-13: 6 via TOPICAL

## 2019-08-12 MED ORDER — TRANEXAMIC ACID 1000 MG/10ML IV SOLN
1.5000 mg/kg/h | INTRAVENOUS | Status: DC
  Filled 2019-08-12: qty 25

## 2019-08-12 MED ORDER — MAGNESIUM SULFATE 50 % IJ SOLN
40.0000 meq | INTRAMUSCULAR | Status: DC
  Filled 2019-08-12: qty 9.85

## 2019-08-12 MED ORDER — BISACODYL 5 MG PO TBEC
5.0000 mg | DELAYED_RELEASE_TABLET | Freq: Once | ORAL | Status: AC
  Administered 2019-08-12: 5 mg via ORAL
  Filled 2019-08-12: qty 1

## 2019-08-12 MED ORDER — DEXMEDETOMIDINE HCL IN NACL 400 MCG/100ML IV SOLN
0.1000 ug/kg/h | INTRAVENOUS | Status: DC
  Filled 2019-08-12: qty 100

## 2019-08-12 MED ORDER — SODIUM CHLORIDE 0.9 % IV SOLN
750.0000 mg | INTRAVENOUS | Status: DC
  Filled 2019-08-12: qty 750

## 2019-08-12 MED ORDER — NITROGLYCERIN IN D5W 200-5 MCG/ML-% IV SOLN
2.0000 ug/min | INTRAVENOUS | Status: AC
  Administered 2019-08-13: 5 ug/min via INTRAVENOUS
  Filled 2019-08-12: qty 250

## 2019-08-12 MED ORDER — PLASMA-LYTE 148 IV SOLN
INTRAVENOUS | Status: AC
  Administered 2019-08-13: 09:00:00 500 mL
  Filled 2019-08-12: qty 2.5

## 2019-08-12 MED ORDER — PHENYLEPHRINE HCL-NACL 20-0.9 MG/250ML-% IV SOLN
30.0000 ug/min | INTRAVENOUS | Status: DC
  Filled 2019-08-12: qty 250

## 2019-08-12 MED ORDER — INSULIN REGULAR(HUMAN) IN NACL 100-0.9 UT/100ML-% IV SOLN
INTRAVENOUS | Status: DC
  Filled 2019-08-12: qty 100

## 2019-08-12 MED ORDER — SODIUM CHLORIDE 0.9 % IV SOLN
INTRAVENOUS | Status: DC
  Filled 2019-08-12: qty 30

## 2019-08-12 NOTE — Progress Notes (Signed)
Bilateral pre CABG Dopplers completed. Preliminary results in CHrt review CV Proc. [place],RVS 08/12/2019, 5:18 PM

## 2019-08-12 NOTE — Progress Notes (Signed)
CARDIAC REHAB PHASE I   Preop education completed with pt and wife. Pt given IS, demonstrates 2250 with ease. Reviewed importance of ambulation and sternal precautions post op. Pt given heart surgery booklet, in-the-tube sheet, and OHS care guide. Pts wife anxious about upcoming surgery. Questions answered, support provided. Will continue to follow pt throughout his stay.  [id number] Rufina Falco, RN BSN 08/12/2019 2:20 PM

## 2019-08-12 NOTE — Progress Notes (Signed)
ANTICOAGULATION CONSULT NOTE - Follow Up Consult  Pharmacy Consult for heparin Indication: CAD awaiting CABG  Labs: Recent Labs    08/10/19 1550 08/10/19 1750 08/10/19 2221 08/11/19 0016 08/11/19 0421 08/12/19 0037  HGB 15.6  --   --   --  14.9 15.7  HCT 45.6  --   --   --  44.0 44.8  PLT 241  --   --   --  258 229  HEPARINUNFRC  --   --   --  0.10*  --  0.26*  CREATININE 1.00  --   --   --  1.03  --   TROPONINIHS 1,068* 1,161* 1,357* 1,365*  --   --     Assessment: 63yo male subtherapeutic on heparin after resumed post-cath; no gtt issues or signs of bleeding per RN.  Goal of Therapy:  Heparin level 0.3-0.7 units/ml   Plan:  Will increase heparin gtt by 2 units/kg/hr to 2000 units/hr and check level in 6 hours.    Wynona Neat, PharmD, BCPS  08/12/2019,1:52 AM

## 2019-08-12 NOTE — Plan of Care (Signed)
  Problem: Education: Goal: Understanding of cardiac disease, CV risk reduction, and recovery process will improve Outcome: Progressing   Problem: Cardiac: Goal: Ability to achieve and maintain adequate cardiovascular perfusion will improve Outcome: Progressing   Problem: Activity: Goal: Ability to tolerate increased activity will improve Outcome: Progressing   Problem: Health Behavior/Discharge Planning: Goal: Ability to safely manage health-related needs after discharge will improve Outcome: Progressing   Problem: Education: Goal: Ability to describe self-care measures that may prevent or decrease complications (Diabetes Survival Skills Education) will improve Outcome: Progressing   Problem: Cardiac: Goal: Ability to maintain an adequate cardiac output will improve Outcome: Progressing
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
## 2019-08-12 NOTE — Progress Notes (Signed)
ANTICOAGULATION CONSULT NOTE - Follow Up Consult  Pharmacy Consult:  Heparin Indication: NSTEMI  No Known Allergies  Patient Measurements: Height: 5\' 11"  (180.3 cm) Weight: 222 lb 8 oz (100.9 kg) IBW/kg (Calculated) : 75.3 Heparin Dosing Weight: 97 kg  Vital Signs: Temp: 97.9 F (36.6 C) (08/26 0640) Temp Source: Oral (08/26 0640) BP: 118/73 (08/26 0640) Pulse Rate: 62 (08/26 0640)  Labs: Recent Labs    08/10/19 1550 08/10/19 1750 08/10/19 2221 08/11/19 0016 08/11/19 0421 08/12/19 0037 08/12/19 0727  HGB 15.6  --   --   --  14.9 15.7  --   HCT 45.6  --   --   --  44.0 44.8  --   PLT 241  --   --   --  258 229  --   HEPARINUNFRC  --   --   --  0.10*  --  0.26* 0.51  CREATININE 1.00  --   --   --  1.03  --   --   TROPONINIHS 1,068* 1,161* 1,357* 1,365*  --   --   --     Estimated Creatinine Clearance: 86.5 mL/min (by C-G formula based on SCr of 1.03 mg/dL).    Assessment: 7 YOM with NSTEMI and cath shows 2vdz.  Pharmacy consulted to dose IV heparin until CABG on 08/13/19.  Heparin level is therapeutic; no bleeding reported.  Goal of Therapy:  Heparin level 0.3-0.7 units/ml Monitor platelets by anticoagulation protocol: Yes   Plan:  Continue heparin gtt at 2000 units/hr Daily heparin level and CBC F/u post CABG  Amaree Loisel D. Mina Marble, PharmD, BCPS, [place] 08/12/2019, 11:12 AM

## 2019-08-13 ENCOUNTER — Inpatient Hospital Stay (HOSPITAL_COMMUNITY): Payer: Medicare Other | Admitting: Certified Registered"

## 2019-08-13 ENCOUNTER — Inpatient Hospital Stay (HOSPITAL_COMMUNITY): Payer: Medicare Other

## 2019-08-13 ENCOUNTER — Inpatient Hospital Stay (HOSPITAL_COMMUNITY)
Admission: EM | Disposition: A | Discharge status: Home / Self Care | Payer: Self-pay | Source: Home / Self Care | Attending: Cardiology

## 2019-08-13 DIAGNOSIS — Z951 Presence of aortocoronary bypass graft: Secondary | ICD-10-CM

## 2019-08-13 DIAGNOSIS — I25110 Atherosclerotic heart disease of native coronary artery with unstable angina pectoris: Secondary | ICD-10-CM

## 2019-08-13 HISTORY — DX: Presence of aortocoronary bypass graft: Z95.1

## 2019-08-13 HISTORY — PX: TEE WITHOUT CARDIOVERSION: SHX5443

## 2019-08-13 HISTORY — PX: CORONARY ARTERY BYPASS GRAFT: SHX141

## 2019-08-13 LAB — POCT I-STAT 7, (LYTES, BLD GAS, ICA,H+H)
Acid-base deficit: 2 mmol/L (ref 0.0–2.0)
Acid-base deficit: 6 mmol/L — ABNORMAL HIGH (ref 0.0–2.0)
Acid-base deficit: 5 mmol/L — ABNORMAL HIGH (ref 0.0–2.0)
Acid-base deficit: 6 mmol/L — ABNORMAL HIGH (ref 0.0–2.0)
Acid-base deficit: 5 mmol/L — ABNORMAL HIGH (ref 0.0–2.0)
Bicarbonate: 23.7 mmol/L (ref 20.0–28.0)
Bicarbonate: 20.5 mmol/L (ref 20.0–28.0)
Bicarbonate: 21 mmol/L (ref 20.0–28.0)
Bicarbonate: 17.2 mmol/L — ABNORMAL LOW (ref 20.0–28.0)
Bicarbonate: 19.4 mmol/L — ABNORMAL LOW (ref 20.0–28.0)
Calcium, Ion: 1.23 mmol/L (ref 1.15–1.40)
Calcium, Ion: 1.26 mmol/L (ref 1.15–1.40)
Calcium, Ion: 1.23 mmol/L (ref 1.15–1.40)
Calcium, Ion: 1.18 mmol/L (ref 1.15–1.40)
Calcium, Ion: 1.2 mmol/L (ref 1.15–1.40)
HCT: 39 % (ref 39.0–52.0)
HCT: 37 % — ABNORMAL LOW (ref 39.0–52.0)
HCT: 35 % — ABNORMAL LOW (ref 39.0–52.0)
HCT: 38 % — ABNORMAL LOW (ref 39.0–52.0)
HCT: 37 % — ABNORMAL LOW (ref 39.0–52.0)
Hemoglobin: 13.3 g/dL (ref 13.0–17.0)
Hemoglobin: 12.6 g/dL — ABNORMAL LOW (ref 13.0–17.0)
Hemoglobin: 11.9 g/dL — ABNORMAL LOW (ref 13.0–17.0)
Hemoglobin: 12.9 g/dL — ABNORMAL LOW (ref 13.0–17.0)
Hemoglobin: 12.6 g/dL — ABNORMAL LOW (ref 13.0–17.0)
O2 Saturation: 100 %
O2 Saturation: 99 %
O2 Saturation: 98 %
O2 Saturation: 97 %
O2 Saturation: 94 %
Patient temperature: 35.7
Patient temperature: 38.1
Patient temperature: 38
Potassium: 4.1 mmol/L (ref 3.5–5.1)
Potassium: 3.6 mmol/L (ref 3.5–5.1)
Potassium: 3.1 mmol/L — ABNORMAL LOW (ref 3.5–5.1)
Potassium: 4.2 mmol/L (ref 3.5–5.1)
Potassium: 3.7 mmol/L (ref 3.5–5.1)
Sodium: 139 mmol/L (ref 135–145)
Sodium: 141 mmol/L (ref 135–145)
Sodium: 142 mmol/L (ref 135–145)
Sodium: 139 mmol/L (ref 135–145)
Sodium: 140 mmol/L (ref 135–145)
TCO2: 25 mmol/L (ref 22–32)
TCO2: 22 mmol/L (ref 22–32)
TCO2: 22 mmol/L (ref 22–32)
TCO2: 18 mmol/L — ABNORMAL LOW (ref 22–32)
TCO2: 20 mmol/L — ABNORMAL LOW (ref 22–32)
pCO2 arterial: 42.3 mmHg (ref 32.0–48.0)
pCO2 arterial: 41.6 mmHg (ref 32.0–48.0)
pCO2 arterial: 39.9 mmHg (ref 32.0–48.0)
pCO2 arterial: 27.9 mmHg — ABNORMAL LOW (ref 32.0–48.0)
pCO2 arterial: 34.7 mmHg (ref 32.0–48.0)
pH, Arterial: 7.356 (ref 7.350–7.450)
pH, Arterial: 7.3 — ABNORMAL LOW (ref 7.350–7.450)
pH, Arterial: 7.323 — ABNORMAL LOW (ref 7.350–7.450)
pH, Arterial: 7.403 (ref 7.350–7.450)
pH, Arterial: 7.361 (ref 7.350–7.450)
pO2, Arterial: 220 mmHg — ABNORMAL HIGH (ref 83.0–108.0)
pO2, Arterial: 182 mmHg — ABNORMAL HIGH (ref 83.0–108.0)
pO2, Arterial: 103 mmHg (ref 83.0–108.0)
pO2, Arterial: 92 mmHg (ref 83.0–108.0)
pO2, Arterial: 75 mmHg — ABNORMAL LOW (ref 83.0–108.0)

## 2019-08-13 LAB — BASIC METABOLIC PANEL
Anion gap: 10 (ref 5–15)
Anion gap: 9 (ref 5–15)
BUN: 15 mg/dL (ref 8–23)
BUN: 14 mg/dL (ref 8–23)
CO2: 23 mmol/L (ref 22–32)
CO2: 20 mmol/L — ABNORMAL LOW (ref 22–32)
Calcium: 8.8 mg/dL — ABNORMAL LOW (ref 8.9–10.3)
Calcium: 8.3 mg/dL — ABNORMAL LOW (ref 8.9–10.3)
Chloride: 105 mmol/L (ref 98–111)
Chloride: 108 mmol/L (ref 98–111)
Creatinine, Ser: 0.96 mg/dL (ref 0.61–1.24)
Creatinine, Ser: 0.86 mg/dL (ref 0.61–1.24)
GFR calc Af Amer: >60 mL/min (ref >60)
GFR calc Af Amer: >60 mL/min (ref >60)
GFR calc non Af Amer: >60 mL/min (ref >60)
GFR calc non Af Amer: >60 mL/min (ref >60)
Glucose, Bld: 142 mg/dL — ABNORMAL HIGH (ref 70–99)
Glucose, Bld: 122 mg/dL — ABNORMAL HIGH (ref 70–99)
Potassium: 3.8 mmol/L (ref 3.5–5.1)
Potassium: 4 mmol/L (ref 3.5–5.1)
Sodium: 138 mmol/L (ref 135–145)
Sodium: 137 mmol/L (ref 135–145)

## 2019-08-13 LAB — BLOOD GAS, ARTERIAL
Acid-base deficit: 2.5 mmol/L — ABNORMAL HIGH (ref 0.0–2.0)
Bicarbonate: 21.2 mmol/L (ref 20.0–28.0)
Drawn by: 270271
FIO2: 21
O2 Saturation: 96.3 %
Patient temperature: 98.6
pCO2 arterial: 32.4 mmHg (ref 32.0–48.0)
pH, Arterial: 7.431 (ref 7.350–7.450)
pO2, Arterial: 79.9 mmHg — ABNORMAL LOW (ref 83.0–108.0)

## 2019-08-13 LAB — POCT I-STAT, CHEM 8
BUN: 14 mg/dL (ref 8–23)
Calcium, Ion: 1.2 mmol/L (ref 1.15–1.40)
Chloride: 107 mmol/L (ref 98–111)
Creatinine, Ser: 0.7 mg/dL (ref 0.61–1.24)
Glucose, Bld: 119 mg/dL — ABNORMAL HIGH (ref 70–99)
HCT: 38 % — ABNORMAL LOW (ref 39.0–52.0)
Hemoglobin: 12.9 g/dL — ABNORMAL LOW (ref 13.0–17.0)
Potassium: 4.1 mmol/L (ref 3.5–5.1)
Sodium: 139 mmol/L (ref 135–145)
TCO2: 19 mmol/L — ABNORMAL LOW (ref 22–32)

## 2019-08-13 LAB — GLUCOSE, CAPILLARY
Glucose-Capillary: 109 mg/dL — ABNORMAL HIGH (ref 70–99)
Glucose-Capillary: 114 mg/dL — ABNORMAL HIGH (ref 70–99)
Glucose-Capillary: 115 mg/dL — ABNORMAL HIGH (ref 70–99)
Glucose-Capillary: 122 mg/dL — ABNORMAL HIGH (ref 70–99)
Glucose-Capillary: 135 mg/dL — ABNORMAL HIGH (ref 70–99)
Glucose-Capillary: 142 mg/dL — ABNORMAL HIGH (ref 70–99)
Glucose-Capillary: 144 mg/dL — ABNORMAL HIGH (ref 70–99)
Glucose-Capillary: 171 mg/dL — ABNORMAL HIGH (ref 70–99)
Glucose-Capillary: 190 mg/dL — ABNORMAL HIGH (ref 70–99)
Glucose-Capillary: 67 mg/dL — ABNORMAL LOW (ref 70–99)
Glucose-Capillary: 98 mg/dL (ref 70–99)

## 2019-08-13 LAB — POCT I-STAT 4, (NA,K, GLUC, HGB,HCT)
Glucose, Bld: 141 mg/dL — ABNORMAL HIGH (ref 70–99)
Glucose, Bld: 150 mg/dL — ABNORMAL HIGH (ref 70–99)
Glucose, Bld: 145 mg/dL — ABNORMAL HIGH (ref 70–99)
Glucose, Bld: 132 mg/dL — ABNORMAL HIGH (ref 70–99)
Glucose, Bld: 149 mg/dL — ABNORMAL HIGH (ref 70–99)
HCT: 40 % (ref 39.0–52.0)
HCT: 38 % — ABNORMAL LOW (ref 39.0–52.0)
HCT: 39 % (ref 39.0–52.0)
HCT: 36 % — ABNORMAL LOW (ref 39.0–52.0)
HCT: 38 % — ABNORMAL LOW (ref 39.0–52.0)
Hemoglobin: 13.6 g/dL (ref 13.0–17.0)
Hemoglobin: 12.9 g/dL — ABNORMAL LOW (ref 13.0–17.0)
Hemoglobin: 13.3 g/dL (ref 13.0–17.0)
Hemoglobin: 12.2 g/dL — ABNORMAL LOW (ref 13.0–17.0)
Hemoglobin: 12.9 g/dL — ABNORMAL LOW (ref 13.0–17.0)
Potassium: 3.6 mmol/L (ref 3.5–5.1)
Potassium: 4.1 mmol/L (ref 3.5–5.1)
Potassium: 3.9 mmol/L (ref 3.5–5.1)
Potassium: 3.6 mmol/L (ref 3.5–5.1)
Potassium: 4 mmol/L (ref 3.5–5.1)
Sodium: 140 mmol/L (ref 135–145)
Sodium: 138 mmol/L (ref 135–145)
Sodium: 140 mmol/L (ref 135–145)
Sodium: 139 mmol/L (ref 135–145)
Sodium: 140 mmol/L (ref 135–145)

## 2019-08-13 LAB — CBC
HCT: 42.7 % (ref 39.0–52.0)
HCT: 37.3 % — ABNORMAL LOW (ref 39.0–52.0)
HCT: 38 % — ABNORMAL LOW (ref 39.0–52.0)
Hemoglobin: 14.4 g/dL (ref 13.0–17.0)
Hemoglobin: 12.6 g/dL — ABNORMAL LOW (ref 13.0–17.0)
Hemoglobin: 13.1 g/dL (ref 13.0–17.0)
MCH: 29.3 pg (ref 26.0–34.0)
MCH: 30 pg (ref 26.0–34.0)
MCH: 29.8 pg (ref 26.0–34.0)
MCHC: 33.7 g/dL (ref 30.0–36.0)
MCHC: 33.8 g/dL (ref 30.0–36.0)
MCHC: 34.5 g/dL (ref 30.0–36.0)
MCV: 86.8 fL (ref 80.0–100.0)
MCV: 88.8 fL (ref 80.0–100.0)
MCV: 86.4 fL (ref 80.0–100.0)
Platelets: 258 10*3/uL (ref 150–400)
Platelets: 232 10*3/uL (ref 150–400)
Platelets: 240 10*3/uL (ref 150–400)
RBC: 4.92 MIL/uL (ref 4.22–5.81)
RBC: 4.2 MIL/uL — ABNORMAL LOW (ref 4.22–5.81)
RBC: 4.4 MIL/uL (ref 4.22–5.81)
RDW: 12.6 % (ref 11.5–15.5)
RDW: 12.8 % (ref 11.5–15.5)
RDW: 12.6 % (ref 11.5–15.5)
WBC: 9 10*3/uL (ref 4.0–10.5)
WBC: 20.9 10*3/uL — ABNORMAL HIGH (ref 4.0–10.5)
WBC: 17.5 10*3/uL — ABNORMAL HIGH (ref 4.0–10.5)
nRBC: 0 % (ref 0.0–0.2)
nRBC: 0 % (ref 0.0–0.2)
nRBC: 0 % (ref 0.0–0.2)

## 2019-08-13 LAB — MAGNESIUM: Magnesium: 2.7 mg/dL — ABNORMAL HIGH (ref 1.7–2.4)

## 2019-08-13 LAB — PROTIME-INR
INR: 1.3 — ABNORMAL HIGH (ref 0.8–1.2)
Prothrombin Time: 16.1 seconds — ABNORMAL HIGH (ref 11.4–15.2)

## 2019-08-13 LAB — APTT: aPTT: 38 seconds — ABNORMAL HIGH (ref 24–36)

## 2019-08-13 LAB — SURGICAL PCR SCREEN
MRSA, PCR: NEGATIVE
Staphylococcus aureus: POSITIVE — AB

## 2019-08-13 SURGERY — OFF PUMP CORONARY ARTERY BYPASS GRAFTING (CABG)
Anesthesia: General | Site: Chest

## 2019-08-13 MED ORDER — SODIUM BICARBONATE 8.4 % IV SOLN
50.0000 meq | Freq: Once | INTRAVENOUS | Status: AC
  Administered 2019-08-13: 50 meq via INTRAVENOUS

## 2019-08-13 MED ORDER — DOCUSATE SODIUM 100 MG PO CAPS
200.0000 mg | ORAL_CAPSULE | Freq: Every day | ORAL | Status: DC
  Administered 2019-08-14 – 2019-08-17 (×4): 200 mg via ORAL
  Filled 2019-08-13 (×4): qty 2

## 2019-08-13 MED ORDER — ORAL CARE MOUTH RINSE
15.0000 mL | OROMUCOSAL | Status: DC
  Administered 2019-08-13: 16:00:00 15 mL via OROMUCOSAL

## 2019-08-13 MED ORDER — FENTANYL CITRATE (PF) 250 MCG/5ML IJ SOLN
INTRAMUSCULAR | Status: AC
  Filled 2019-08-13: qty 5

## 2019-08-13 MED ORDER — BISACODYL 10 MG RE SUPP: 10.0000 mg | Freq: Every day | RECTAL | Status: DC

## 2019-08-13 MED ORDER — PROPOFOL 10 MG/ML IV BOLUS
INTRAVENOUS | Status: DC | PRN
  Administered 2019-08-13: 60 mg via INTRAVENOUS
  Administered 2019-08-13: 40 mg via INTRAVENOUS

## 2019-08-13 MED ORDER — ACETAMINOPHEN 500 MG PO TABS
1000.0000 mg | ORAL_TABLET | Freq: Four times a day (QID) | ORAL | Status: DC
  Administered 2019-08-13 – 2019-08-17 (×13): 1000 mg via ORAL
  Filled 2019-08-13 (×13): qty 2

## 2019-08-13 MED ORDER — LACTATED RINGERS IV SOLN: INTRAVENOUS | Status: DC

## 2019-08-13 MED ORDER — FENTANYL CITRATE (PF) 250 MCG/5ML IJ SOLN
INTRAMUSCULAR | Status: AC
  Filled 2019-08-13: qty 20

## 2019-08-13 MED ORDER — PROTAMINE SULFATE 10 MG/ML IV SOLN
INTRAVENOUS | Status: AC
  Filled 2019-08-13: qty 10

## 2019-08-13 MED ORDER — POTASSIUM CHLORIDE 10 MEQ/50ML IV SOLN
10.0000 meq | INTRAVENOUS | Status: AC
  Administered 2019-08-13 (×3): 10 meq via INTRAVENOUS

## 2019-08-13 MED ORDER — LACTATED RINGERS IV SOLN
INTRAVENOUS | Status: DC
  Administered 2019-08-13: 13:00:00 via INTRAVENOUS

## 2019-08-13 MED ORDER — PHENYLEPHRINE HCL-NACL 20-0.9 MG/250ML-% IV SOLN: 0.0000 ug/min | INTRAVENOUS | Status: DC

## 2019-08-13 MED ORDER — SODIUM CHLORIDE 0.9 % IV SOLN
INTRAVENOUS | Status: DC | PRN
  Administered 2019-08-13: 1.6 [IU]/h via INTRAVENOUS

## 2019-08-13 MED ORDER — LACTATED RINGERS IV SOLN: 500.0000 mL | Freq: Once | INTRAVENOUS | Status: DC | PRN

## 2019-08-13 MED ORDER — 0.9 % SODIUM CHLORIDE (POUR BTL) OPTIME
TOPICAL | Status: DC | PRN
  Administered 2019-08-13: 09:00:00 5000 mL

## 2019-08-13 MED ORDER — CHLORHEXIDINE GLUCONATE CLOTH 2 % EX PADS
6.0000 | MEDICATED_PAD | Freq: Every day | CUTANEOUS | Status: DC
  Administered 2019-08-14 – 2019-08-15 (×2): 6 via TOPICAL

## 2019-08-13 MED ORDER — FAMOTIDINE IN NACL 20-0.9 MG/50ML-% IV SOLN
20.0000 mg | Freq: Two times a day (BID) | INTRAVENOUS | Status: AC
  Administered 2019-08-13: 20 mg via INTRAVENOUS

## 2019-08-13 MED ORDER — SODIUM CHLORIDE 0.9% FLUSH
10.0000 mL | Freq: Two times a day (BID) | INTRAVENOUS | Status: DC
  Administered 2019-08-13 – 2019-08-15 (×4): 10 mL

## 2019-08-13 MED ORDER — MIDAZOLAM HCL (PF) 10 MG/2ML IJ SOLN
INTRAMUSCULAR | Status: AC
  Filled 2019-08-13: qty 2

## 2019-08-13 MED ORDER — FENTANYL CITRATE (PF) 250 MCG/5ML IJ SOLN
INTRAMUSCULAR | Status: DC | PRN
  Administered 2019-08-13: 50 ug via INTRAVENOUS
  Administered 2019-08-13: 150 ug via INTRAVENOUS
  Administered 2019-08-13: 100 ug via INTRAVENOUS
  Administered 2019-08-13 (×2): 250 ug via INTRAVENOUS
  Administered 2019-08-13: 50 ug via INTRAVENOUS
  Administered 2019-08-13: 200 ug via INTRAVENOUS
  Administered 2019-08-13 (×2): 100 ug via INTRAVENOUS

## 2019-08-13 MED ORDER — NITROGLYCERIN IN D5W 200-5 MCG/ML-% IV SOLN: 0.0000 ug/min | INTRAVENOUS | Status: DC

## 2019-08-13 MED ORDER — SODIUM CHLORIDE 0.9 % IV SOLN: 250.0000 mL | INTRAVENOUS | Status: DC

## 2019-08-13 MED ORDER — CHLORHEXIDINE GLUCONATE 0.12 % MT SOLN
15.0000 mL | OROMUCOSAL | Status: AC
  Administered 2019-08-13: 14:00:00 15 mL via OROMUCOSAL

## 2019-08-13 MED ORDER — TRAMADOL HCL 50 MG PO TABS
50.0000 mg | ORAL_TABLET | ORAL | Status: DC | PRN
  Administered 2019-08-14 (×2): 50 mg via ORAL
  Administered 2019-08-17: 100 mg via ORAL
  Filled 2019-08-13 (×2): qty 1
  Filled 2019-08-13: qty 2

## 2019-08-13 MED ORDER — SODIUM CHLORIDE 0.9 % IV SOLN: INTRAVENOUS | Status: DC

## 2019-08-13 MED ORDER — ONDANSETRON HCL 4 MG/2ML IJ SOLN: 4.0000 mg | Freq: Four times a day (QID) | INTRAMUSCULAR | Status: DC | PRN

## 2019-08-13 MED ORDER — HEPARIN SODIUM (PORCINE) 1000 UNIT/ML IJ SOLN
INTRAMUSCULAR | Status: DC | PRN
  Administered 2019-08-13: 3000 [IU] via INTRAVENOUS
  Administered 2019-08-13: 10000 [IU] via INTRAVENOUS

## 2019-08-13 MED ORDER — DEXTROSE 50 % IV SOLN
INTRAVENOUS | Status: AC
  Administered 2019-08-13: 15:00:00 13 mL via INTRAVENOUS
  Filled 2019-08-13: qty 50

## 2019-08-13 MED ORDER — BISACODYL 5 MG PO TBEC
10.0000 mg | DELAYED_RELEASE_TABLET | Freq: Every day | ORAL | Status: DC
  Administered 2019-08-14 – 2019-08-17 (×4): 10 mg via ORAL
  Filled 2019-08-13 (×4): qty 2

## 2019-08-13 MED ORDER — MUPIROCIN 2 % EX OINT
1.0000 "application " | TOPICAL_OINTMENT | Freq: Two times a day (BID) | CUTANEOUS | Status: DC
  Administered 2019-08-13 – 2019-08-17 (×9): 1 via NASAL
  Filled 2019-08-13 (×2): qty 22

## 2019-08-13 MED ORDER — PROTAMINE SULFATE 10 MG/ML IV SOLN
INTRAVENOUS | Status: DC | PRN
  Administered 2019-08-13: 100 mg via INTRAVENOUS

## 2019-08-13 MED ORDER — DEXTROSE 50 % IV SOLN
13.0000 mL | Freq: Once | INTRAVENOUS | Status: AC
  Administered 2019-08-13: 15:00:00 13 mL via INTRAVENOUS

## 2019-08-13 MED ORDER — MAGNESIUM SULFATE 4 GM/100ML IV SOLN
4.0000 g | Freq: Once | INTRAVENOUS | Status: AC
  Administered 2019-08-13: 15:00:00 4 g via INTRAVENOUS
  Filled 2019-08-13: qty 100

## 2019-08-13 MED ORDER — PANTOPRAZOLE SODIUM 40 MG PO TBEC
40.0000 mg | DELAYED_RELEASE_TABLET | Freq: Every day | ORAL | Status: DC
  Administered 2019-08-15 – 2019-08-17 (×3): 40 mg via ORAL
  Filled 2019-08-13 (×3): qty 1

## 2019-08-13 MED ORDER — SODIUM CHLORIDE (PF) 0.9 % IJ SOLN
OROMUCOSAL | Status: DC | PRN
  Administered 2019-08-13 (×3): 4 mL via TOPICAL

## 2019-08-13 MED ORDER — MORPHINE SULFATE (PF) 2 MG/ML IV SOLN
1.0000 mg | INTRAVENOUS | Status: DC | PRN
  Administered 2019-08-13: 2 mg via INTRAVENOUS
  Administered 2019-08-13: 4 mg via INTRAVENOUS
  Administered 2019-08-13 – 2019-08-14 (×6): 2 mg via INTRAVENOUS
  Filled 2019-08-13 (×6): qty 1
  Filled 2019-08-13: qty 2
  Filled 2019-08-13: qty 1
  Filled 2019-08-13: qty 2
  Filled 2019-08-13: qty 1

## 2019-08-13 MED ORDER — ALBUMIN HUMAN 5 % IV SOLN
250.0000 mL | INTRAVENOUS | Status: AC | PRN
  Administered 2019-08-13 (×3): 12.5 g via INTRAVENOUS
  Filled 2019-08-13: qty 250

## 2019-08-13 MED ORDER — SODIUM CHLORIDE 0.9% FLUSH: 3.0000 mL | Freq: Two times a day (BID) | INTRAVENOUS | Status: DC

## 2019-08-13 MED ORDER — SODIUM CHLORIDE 0.9% FLUSH: 10.0000 mL | INTRAVENOUS | Status: DC | PRN

## 2019-08-13 MED ORDER — PROPOFOL 10 MG/ML IV BOLUS
INTRAVENOUS | Status: AC
  Filled 2019-08-13: qty 20

## 2019-08-13 MED ORDER — ACETAMINOPHEN 650 MG RE SUPP: 650.0000 mg | Freq: Once | RECTAL | Status: AC

## 2019-08-13 MED ORDER — DEXMEDETOMIDINE HCL IN NACL 200 MCG/50ML IV SOLN
INTRAVENOUS | Status: DC | PRN
  Administered 2019-08-13: 08:00:00 .2 ug/kg/h via INTRAVENOUS

## 2019-08-13 MED ORDER — INSULIN REGULAR BOLUS VIA INFUSION
0.0000 [IU] | Freq: Three times a day (TID) | INTRAVENOUS | Status: DC
  Filled 2019-08-13: qty 10

## 2019-08-13 MED ORDER — VANCOMYCIN HCL IN DEXTROSE 1-5 GM/200ML-% IV SOLN
1000.0000 mg | Freq: Once | INTRAVENOUS | Status: AC
  Administered 2019-08-13: 20:00:00 1000 mg via INTRAVENOUS
  Filled 2019-08-13: qty 200

## 2019-08-13 MED ORDER — ROCURONIUM BROMIDE 100 MG/10ML IV SOLN
INTRAVENOUS | Status: DC | PRN
  Administered 2019-08-13 (×2): 50 mg via INTRAVENOUS
  Administered 2019-08-13: 100 mg via INTRAVENOUS
  Administered 2019-08-13: 50 mg via INTRAVENOUS

## 2019-08-13 MED ORDER — LACTATED RINGERS IV SOLN
INTRAVENOUS | Status: DC | PRN
  Administered 2019-08-13 (×4): via INTRAVENOUS

## 2019-08-13 MED ORDER — CHLORHEXIDINE GLUCONATE CLOTH 2 % EX PADS: 6.0000 | MEDICATED_PAD | Freq: Every day | CUTANEOUS | Status: DC

## 2019-08-13 MED ORDER — PHENYLEPHRINE 40 MCG/ML (10ML) SYRINGE FOR IV PUSH (FOR BLOOD PRESSURE SUPPORT)
PREFILLED_SYRINGE | INTRAVENOUS | Status: AC
  Filled 2019-08-13: qty 20

## 2019-08-13 MED ORDER — OXYCODONE HCL 5 MG PO TABS
5.0000 mg | ORAL_TABLET | ORAL | Status: DC | PRN
  Administered 2019-08-13 – 2019-08-16 (×8): 10 mg via ORAL
  Filled 2019-08-13 (×8): qty 2

## 2019-08-13 MED ORDER — HEPARIN SODIUM (PORCINE) 1000 UNIT/ML IJ SOLN
INTRAMUSCULAR | Status: AC
  Filled 2019-08-13: qty 1

## 2019-08-13 MED ORDER — SODIUM CHLORIDE 0.9% FLUSH: 3.0000 mL | INTRAVENOUS | Status: DC | PRN

## 2019-08-13 MED ORDER — ASPIRIN EC 325 MG PO TBEC
325.0000 mg | DELAYED_RELEASE_TABLET | Freq: Every day | ORAL | Status: DC
  Administered 2019-08-14 – 2019-08-17 (×4): 325 mg via ORAL
  Filled 2019-08-13 (×4): qty 1

## 2019-08-13 MED ORDER — ROCURONIUM BROMIDE 10 MG/ML (PF) SYRINGE
PREFILLED_SYRINGE | INTRAVENOUS | Status: AC
  Filled 2019-08-13: qty 30

## 2019-08-13 MED ORDER — ACETAMINOPHEN 160 MG/5ML PO SOLN: 1000.0000 mg | Freq: Four times a day (QID) | ORAL | Status: DC

## 2019-08-13 MED ORDER — LIDOCAINE 2% (20 MG/ML) 5 ML SYRINGE
INTRAMUSCULAR | Status: AC
  Filled 2019-08-13: qty 5

## 2019-08-13 MED ORDER — MIDAZOLAM HCL 5 MG/5ML IJ SOLN
INTRAMUSCULAR | Status: DC | PRN
  Administered 2019-08-13: 2 mg via INTRAVENOUS
  Administered 2019-08-13 (×3): 1 mg via INTRAVENOUS
  Administered 2019-08-13: 2 mg via INTRAVENOUS
  Administered 2019-08-13: 1 mg via INTRAVENOUS

## 2019-08-13 MED ORDER — LIDOCAINE HCL (CARDIAC) PF 100 MG/5ML IV SOSY
PREFILLED_SYRINGE | INTRAVENOUS | Status: DC | PRN
  Administered 2019-08-13: 80 mg via INTRAVENOUS

## 2019-08-13 MED ORDER — EPHEDRINE SULFATE 50 MG/ML IJ SOLN
INTRAMUSCULAR | Status: DC | PRN
  Administered 2019-08-13 (×2): 5 mg via INTRAVENOUS

## 2019-08-13 MED ORDER — METOPROLOL TARTRATE 12.5 MG HALF TABLET
12.5000 mg | ORAL_TABLET | Freq: Two times a day (BID) | ORAL | Status: DC
  Filled 2019-08-13: qty 1

## 2019-08-13 MED ORDER — METOPROLOL TARTRATE 5 MG/5ML IV SOLN
2.5000 mg | INTRAVENOUS | Status: DC | PRN
  Administered 2019-08-13 – 2019-08-14 (×3): 5 mg via INTRAVENOUS
  Filled 2019-08-13 (×4): qty 5

## 2019-08-13 MED ORDER — METOPROLOL TARTRATE 25 MG/10 ML ORAL SUSPENSION: 12.5000 mg | Freq: Two times a day (BID) | ORAL | Status: DC

## 2019-08-13 MED ORDER — ASPIRIN 81 MG PO CHEW: 324.0000 mg | CHEWABLE_TABLET | Freq: Every day | ORAL | Status: DC

## 2019-08-13 MED ORDER — DEXMEDETOMIDINE HCL IN NACL 200 MCG/50ML IV SOLN: 0.0000 ug/kg/h | INTRAVENOUS | Status: DC

## 2019-08-13 MED ORDER — PHENYLEPHRINE HCL (PRESSORS) 10 MG/ML IV SOLN
INTRAVENOUS | Status: DC | PRN
  Administered 2019-08-13: 80 ug via INTRAVENOUS
  Administered 2019-08-13: 40 ug via INTRAVENOUS
  Administered 2019-08-13: 120 ug via INTRAVENOUS
  Administered 2019-08-13: 80 ug via INTRAVENOUS
  Administered 2019-08-13: 40 ug via INTRAVENOUS

## 2019-08-13 MED ORDER — SODIUM CHLORIDE 0.9 % IV SOLN
1.5000 g | Freq: Two times a day (BID) | INTRAVENOUS | Status: AC
  Administered 2019-08-13 – 2019-08-15 (×4): 1.5 g via INTRAVENOUS
  Filled 2019-08-13 (×4): qty 1.5

## 2019-08-13 MED ORDER — SUCCINYLCHOLINE CHLORIDE 200 MG/10ML IV SOSY
PREFILLED_SYRINGE | INTRAVENOUS | Status: AC
  Filled 2019-08-13: qty 10

## 2019-08-13 MED ORDER — MUPIROCIN 2 % EX OINT
TOPICAL_OINTMENT | CUTANEOUS | Status: AC
  Administered 2019-08-13: 1 via NASAL
  Filled 2019-08-13: qty 22

## 2019-08-13 MED ORDER — INSULIN REGULAR(HUMAN) IN NACL 100-0.9 UT/100ML-% IV SOLN: INTRAVENOUS | Status: DC

## 2019-08-13 MED ORDER — METOCLOPRAMIDE HCL 5 MG/ML IJ SOLN
10.0000 mg | Freq: Four times a day (QID) | INTRAMUSCULAR | Status: AC
  Administered 2019-08-13 – 2019-08-14 (×4): 10 mg via INTRAVENOUS
  Filled 2019-08-13 (×3): qty 2

## 2019-08-13 MED ORDER — ACETAMINOPHEN 160 MG/5ML PO SOLN
650.0000 mg | Freq: Once | ORAL | Status: AC
  Administered 2019-08-13: 650 mg

## 2019-08-13 MED ORDER — SODIUM CHLORIDE 0.9 % IV SOLN
INTRAVENOUS | Status: DC | PRN
  Administered 2019-08-13: 08:00:00 30 ug/min via INTRAVENOUS

## 2019-08-13 MED ORDER — CHLORHEXIDINE GLUCONATE 0.12% ORAL RINSE (MEDLINE KIT)
15.0000 mL | Freq: Two times a day (BID) | OROMUCOSAL | Status: DC
  Administered 2019-08-13 – 2019-08-15 (×3): 15 mL via OROMUCOSAL

## 2019-08-13 MED ORDER — MIDAZOLAM HCL 2 MG/2ML IJ SOLN: 2.0000 mg | INTRAMUSCULAR | Status: DC | PRN

## 2019-08-13 MED ORDER — SODIUM CHLORIDE 0.45 % IV SOLN
INTRAVENOUS | Status: DC | PRN
  Administered 2019-08-13: 13:00:00 via INTRAVENOUS

## 2019-08-13 SURGICAL SUPPLY — 112 items
BAG DECANTER FOR FLEXI CONT (MISCELLANEOUS) ×6 IMPLANT
BASKET HEART  (ORDER IN 25'S) (MISCELLANEOUS)
BASKET HEART (ORDER IN 25'S) (MISCELLANEOUS)
BASKET HEART (ORDER IN 25S) (MISCELLANEOUS) ×2 IMPLANT
BLADE CLIPPER SURG (BLADE) ×4 IMPLANT
BLADE STERNUM SYSTEM 6 (BLADE) ×4 IMPLANT
BLADE SURG 11 STRL SS (BLADE) ×2 IMPLANT
BLOWER MISTER CAL-MED (MISCELLANEOUS) ×4 IMPLANT
BNDG ELASTIC 4X5.8 VLCR STR LF (GAUZE/BANDAGES/DRESSINGS) ×4 IMPLANT
BNDG ELASTIC 6X5.8 VLCR STR LF (GAUZE/BANDAGES/DRESSINGS) ×4 IMPLANT
BNDG GAUZE ELAST 4 BULKY (GAUZE/BANDAGES/DRESSINGS) ×4 IMPLANT
CABLE SURGICAL S-101-97-12 (CABLE) ×2 IMPLANT
CANISTER SUCT 3000ML PPV (MISCELLANEOUS) ×6 IMPLANT
CANNULA EZ GLIDE AORTIC 21FR (CANNULA) ×4 IMPLANT
CANNULA VESSEL 3MM BLUNT TIP (CANNULA) ×2 IMPLANT
CATH CPB KIT HENDRICKSON (MISCELLANEOUS) ×2 IMPLANT
CLIP RETRACTION 3.0MM CORONARY (MISCELLANEOUS) ×2 IMPLANT
CLIP VESOCCLUDE MED 24/CT (CLIP) IMPLANT
CLIP VESOCCLUDE SM WIDE 24/CT (CLIP) IMPLANT
CONNECTOR BLAKE 2:1 CARIO BLK (MISCELLANEOUS) ×2 IMPLANT
COVER WAND RF STERILE (DRAPES) ×2 IMPLANT
DEFOGGER ANTIFOG KIT (MISCELLANEOUS) ×2 IMPLANT
DERMABOND ADVANCED (GAUZE/BANDAGES/DRESSINGS) ×2
DERMABOND ADVANCED .7 DNX12 (GAUZE/BANDAGES/DRESSINGS) IMPLANT
DRAIN CHANNEL 19F RND (DRAIN) ×6 IMPLANT
DRAIN CONNECTOR BLAKE 1:1 (MISCELLANEOUS) ×2 IMPLANT
DRAPE CARDIOVASCULAR INCISE (DRAPES) ×2
DRAPE INCISE IOBAN 66X45 STRL (DRAPES) ×2 IMPLANT
DRAPE SLUSH/WARMER DISC (DRAPES) ×4 IMPLANT
DRAPE SRG 135X102X78XABS (DRAPES) ×2 IMPLANT
DRSG COVADERM 4X14 (GAUZE/BANDAGES/DRESSINGS) ×4 IMPLANT
ELECT REM PT RETURN 9FT ADLT (ELECTROSURGICAL) ×8
ELECTRODE REM PT RTRN 9FT ADLT (ELECTROSURGICAL) ×4 IMPLANT
EVACUATOR SILICONE 100CC (DRAIN) ×4 IMPLANT
FELT TEFLON 1X6 (MISCELLANEOUS) ×6 IMPLANT
GAUZE SPONGE 4X4 12PLY STRL (GAUZE/BANDAGES/DRESSINGS) ×8 IMPLANT
GAUZE SPONGE 4X4 16PLY XRAY LF (GAUZE/BANDAGES/DRESSINGS) ×4 IMPLANT
GLOVE BIO SURGEON STRL SZ 6 (GLOVE) ×6 IMPLANT
GLOVE BIO SURGEON STRL SZ 6.5 (GLOVE) ×4 IMPLANT
GLOVE BIO SURGEON STRL SZ7 (GLOVE) ×8 IMPLANT
GLOVE BIO SURGEONS STRL SZ 6.5 (GLOVE) ×4
GLOVE BIOGEL PI IND STRL 6 (GLOVE) IMPLANT
GLOVE BIOGEL PI IND STRL 6.5 (GLOVE) IMPLANT
GLOVE BIOGEL PI IND STRL 7.5 (GLOVE) ×4 IMPLANT
GLOVE BIOGEL PI IND STRL 9 (GLOVE) IMPLANT
GLOVE BIOGEL PI INDICATOR 6 (GLOVE) ×6
GLOVE BIOGEL PI INDICATOR 6.5 (GLOVE) ×10
GLOVE BIOGEL PI INDICATOR 7.5 (GLOVE) ×2
GLOVE BIOGEL PI INDICATOR 9 (GLOVE) ×2
GOWN STRL REUS W/ TWL LRG LVL3 (GOWN DISPOSABLE) ×8 IMPLANT
GOWN STRL REUS W/ TWL XL LVL3 (GOWN DISPOSABLE) ×4 IMPLANT
GOWN STRL REUS W/TWL LRG LVL3 (GOWN DISPOSABLE) ×14
GOWN STRL REUS W/TWL XL LVL3 (GOWN DISPOSABLE) ×4
HEMOSTAT POWDER SURGIFOAM 1G (HEMOSTASIS) ×12 IMPLANT
KIT BASIN OR (CUSTOM PROCEDURE TRAY) ×4 IMPLANT
KIT SUCTION CATH 14FR (SUCTIONS) ×6 IMPLANT
KIT TURNOVER KIT B (KITS) ×4 IMPLANT
KIT VASOVIEW HEMOPRO 2 VH 4000 (KITS) ×4 IMPLANT
LEAD PACING MYOCARDI (MISCELLANEOUS) ×2 IMPLANT
MARKER GRAFT CORONARY BYPASS (MISCELLANEOUS) ×10 IMPLANT
NS IRRIG 1000ML POUR BTL (IV SOLUTION) ×20 IMPLANT
OFFPUMP STABILIZER SUV (MISCELLANEOUS) ×4 IMPLANT
PACK E OPEN HEART (SUTURE) ×4 IMPLANT
PACK OPEN HEART (CUSTOM PROCEDURE TRAY) ×4 IMPLANT
PAD ARMBOARD 7.5X6 YLW CONV (MISCELLANEOUS) ×4 IMPLANT
PAD ELECT DEFIB RADIOL ZOLL (MISCELLANEOUS) ×4 IMPLANT
PENCIL BUTTON HOLSTER BLD 10FT (ELECTRODE) ×4 IMPLANT
POSITIONER ACROBAT-I OFFPUMP (MISCELLANEOUS) ×4 IMPLANT
POSITIONER HEAD DONUT 9IN (MISCELLANEOUS) ×4 IMPLANT
PUNCH AORTIC ROTATE 4.0MM (MISCELLANEOUS) ×2 IMPLANT
PUNCH AORTIC ROTATE 4.5MM 8IN (MISCELLANEOUS) IMPLANT
PUNCH AORTIC ROTATE 5MM 8IN (MISCELLANEOUS) IMPLANT
SHUNT FLO COIL 1.50MM (MISCELLANEOUS) ×2 IMPLANT
SPONGE LAP 18X18 RF (DISPOSABLE) IMPLANT
SPONGE LAP 4X18 RFD (DISPOSABLE) IMPLANT
SUT BONE WAX W31G (SUTURE) ×4 IMPLANT
SUT ETHIBOND X763 2 0 SH 1 (SUTURE) ×4 IMPLANT
SUT MNCRL AB 3-0 PS2 18 (SUTURE) ×8 IMPLANT
SUT MNCRL AB 4-0 PS2 18 (SUTURE) ×2 IMPLANT
SUT PDS AB 1 CTX 36 (SUTURE) ×8 IMPLANT
SUT PROLENE 2 0 SH DA (SUTURE) IMPLANT
SUT PROLENE 3 0 SH DA (SUTURE) ×4 IMPLANT
SUT PROLENE 3 0 SH1 36 (SUTURE) ×2 IMPLANT
SUT PROLENE 4 0 RB 1 (SUTURE)
SUT PROLENE 4 0 SH DA (SUTURE) IMPLANT
SUT PROLENE 4-0 RB1 .5 CRCL 36 (SUTURE) IMPLANT
SUT PROLENE 5 0 C 1 36 (SUTURE) ×16 IMPLANT
SUT PROLENE 6 0 C 1 30 (SUTURE) IMPLANT
SUT PROLENE 7 0 BV 1 (SUTURE) ×2 IMPLANT
SUT PROLENE 7 0 BV1 MDA (SUTURE) ×2 IMPLANT
SUT PROLENE 8 0 BV175 6 (SUTURE) IMPLANT
SUT PROLENE BLUE 7 0 (SUTURE) ×4 IMPLANT
SUT PROLENE POLY MONO (SUTURE) IMPLANT
SUT SILK  1 MH (SUTURE) ×2
SUT SILK 1 MH (SUTURE) IMPLANT
SUT STEEL 6MS V (SUTURE) ×8 IMPLANT
SUT VIC AB 2-0 CT1 27 (SUTURE) ×2
SUT VIC AB 2-0 CT1 TAPERPNT 27 (SUTURE) IMPLANT
SUT VIC AB 2-0 CTX 27 (SUTURE) ×4 IMPLANT
SYSTEM HEARTSTRING SEAL 3.8 (VASCULAR PRODUCTS) ×2 IMPLANT
SYSTEM HEARTSTRING SEAL 3.8MM (VASCULAR PRODUCTS)
SYSTEM HEARTSTRING SEAL 4.3 (VASCULAR PRODUCTS) ×2 IMPLANT
SYSTEM HEARTSTRING SEAL 4.3MM (VASCULAR PRODUCTS)
SYSTEM SAHARA CHEST DRAIN ATS (WOUND CARE) ×4 IMPLANT
TAPE CLOTH SURG 4X10 WHT LF (GAUZE/BANDAGES/DRESSINGS) ×4 IMPLANT
TAPES RETRACTO (MISCELLANEOUS) ×6 IMPLANT
TOWEL GREEN STERILE (TOWEL DISPOSABLE) ×4 IMPLANT
TOWEL GREEN STERILE FF (TOWEL DISPOSABLE) ×2 IMPLANT
TRAY FOLEY SLVR 16FR TEMP STAT (SET/KITS/TRAYS/PACK) ×4 IMPLANT
TUBING LAP HI FLOW INSUFFLATIO (TUBING) ×4 IMPLANT
UNDERPAD 30X30 (UNDERPADS AND DIAPERS) ×4 IMPLANT
WATER STERILE IRR 1000ML POUR (IV SOLUTION) ×8 IMPLANT

## 2019-08-13 NOTE — Progress Notes (Signed)
Pt watched both surgical education videos. All questions answered. Education given on diabetes as well as incentive spirometer. Hoover Brunette, RN

## 2019-08-13 NOTE — Discharge Summary (Signed)
Physician Discharge Summary  Patient ID: Anthony Alvarez MRN: [id number] DOB/AGE: January 23, 1956 63 y.o.  Admit date: 08/10/2019 Discharge date: 08/17/2019  Admission Diagnoses:  Patient Active Problem List   Diagnosis Date Noted  . Multivessel CAD - 100% LAD (with Diag), 99% mRCA (subtotal occlusio) 08/12/2019  . NSTEMI (non-ST elevated myocardial infarction) ([place]) 08/10/2019   Discharge Diagnoses:   Patient Active Problem List   Diagnosis Date Noted  . S/P Off Pump CABG x 3 08/13/2019  . Multivessel CAD - 100% LAD (with Diag), 99% mRCA (subtotal occlusio) 08/12/2019  . NSTEMI (non-ST elevated myocardial infarction) ([place]) 08/10/2019   Discharged Condition: good  History of Present Illness:  Anthony Alvarez is a 63 yo white male with history of DM and Hyperlipidemia.  He presented to the Emergency Department with complaints of chest pain.  He states he experiencing intermittent substernal chest pain with episodes lasting 30 minutes.  This has been occurring for 4-6 weeks.  This was associated with shortness of breath, and diaphoresis.  He was also able to feel pain in his shoulder.  These episodes primarily occurred with activity.  The patient presented to his PCP for evaluation.  He developed chest pain that morning while taking his trash out.  EKG in the doctors off showed a left bundle branch block with possible inferior block.  EMS was called and after review of previous EKGs, the bundle branch block was new.  They initiated Code STEMI enroute to the emergency department.  He was pain free in the ED after administration of ASA and sub-linqual NTG.  He was evaluated by Cardiology who canceled code STEMI.  Troponin was elevated.  He was admitted for further care.  Hospital Course:   The patient was taken to the catheterization lab on 08/11/2019.  This showed critical 2 vessel CAD and a reduced EF of 35-45%.  It was felt coronary bypass grafting would be indicated and TCTS consult was obtained.  He  was evaluated by Dr. Kipp Brood who was in agreement the patient would benefit from coronary bypass grafting.  The risks and benefits of the procedure were explained to the patient and he was agreeable to proceed.  He was taken to the operating room on 08/13/2019.  He underwent off pump coronary bypass grafting x 3 utilizing LIMA to LAD, RSVG to PDA, and RSVG to PLVB.  He also underwent endoscopic harvest of greater saphenous vein from his right leg.  He tolerated the procedure without difficulty and was taken to the SICU in stable condition.  The patient was extubated the evening of surgery without difficulty. He remained afebrile and hemodynamically stable. Anthony Alvarez, a line, chest tubes, and foley were removed early in the post operative course. Coreg was started and titrated accordingly. He was volume over loaded and diuresed. He had ABL anemia. He did not require a post op transfusion. Last H and H was 12.7 and 36.5. He was weaned off the insulin drip.  He was started on Metformin, which was titrated accordingly. The patient's glucose remained well controlled.The patient's HGA1C pre op was 8.1 . He will need close medical follow up after discharge for further management of diabetes and surveillance of HGA1C. The patient was felt surgically stable for transfer from the ICU to PCTU for further convalescence on 08/29. He was started on Losartan for better BP control. He continues to progress with cardiac rehab. He was ambulating on room air. He has been tolerating a diet and has had a bowel movement. The  patient is felt surgically stable for discharge today.      Significant Diagnostic Studies: angiography:    RV Branch lesion is 95% stenosed.  RPAV lesion is 95% stenosed.  Mid RCA lesion is 95% stenosed.  Prox LAD to Mid LAD lesion is 100% stenosed.  There is moderate left ventricular systolic dysfunction.  LV end diastolic pressure is moderately elevated.  The left ventricular ejection fraction  is 35-45% by visual estimate.   1. Critical 2 vessel obstructive CAD    - CTO of the proximal LAD. LAD fills late by left to left collaterals    - 95% thrombotic lesion in the mid RCA with distal embolization into the PL branch 2. Moderate LV dysfunction. EF 40% with severe inferior wall HK 3. Moderately elevated LVEDP 24 mm Hg.  Treatments: surgery:   Discharge Exam: Blood pressure 133/66, pulse 80, temperature 98.2 F (36.8 C), temperature source Oral, resp. rate (!) 22, height '5\' 11"'$  (1.803 m), weight 103.1 kg, SpO2 96 %.   Cardiovascular: RRR Pulmonary: Slightly diminished bibasilar breath sounds Abdomen: Soft, non tender, bowel sounds present. Extremities: Mild bilateral lower extremity edema. Wound: Clean and dry.  No erythema or signs of infection.  Disposition: Discharge disposition: 01-Home or Self Care     Stable and discharged to home  Discharge Medications:  Discharge Instructions    Amb Referral to Cardiac Rehabilitation   Complete by: As directed    [email or web address]   Diagnosis: CABG   CABG X ___: 3   After initial evaluation and assessments completed: Virtual Based Care may be provided alone or in conjunction with Phase 2 Cardiac Rehab based on patient barriers.: Yes     Allergies as of 08/17/2019   No Known Allergies     Medication List    STOP taking these medications   ibuprofen 200 MG tablet Commonly known as: ADVIL     TAKE these medications   acetaminophen 500 MG tablet Commonly known as: TYLENOL Take 500-1,000 mg by mouth every 8 (eight) hours as needed (for pain).   aspirin 325 MG EC tablet Take 1 tablet (325 mg total) by mouth daily.   atorvastatin 80 MG tablet Commonly known as: LIPITOR Take 1 tablet (80 mg total) by mouth daily at 6 PM.   blood glucose meter kit and supplies Kit Dispense based on patient and insurance preference. Use up to four times daily as directed. (FOR ICD-9 250.00, 250.01).   carvedilol 6.25 MG  tablet Commonly known as: COREG Take 1 tablet (6.25 mg total) by mouth 2 (two) times daily with a meal.   furosemide 40 MG tablet Commonly known as: LASIX Take 1 tablet (40 mg total) by mouth daily. For one week then stop.   glipiZIDE 5 MG tablet Commonly known as: GLUCOTROL Take 1 tablet (5 mg total) by mouth 2 (two) times daily.   losartan 25 MG tablet Commonly known as: COZAAR Take 1 tablet (25 mg total) by mouth daily. Start taking on: August 18, 2019   metFORMIN 850 MG tablet Commonly known as: GLUCOPHAGE Take 1 tablet (850 mg total) by mouth 2 (two) times daily with a meal.   oxyCODONE 5 MG immediate release tablet Commonly known as: Oxy IR/ROXICODONE Take 1 tablet (5 mg total) by mouth every 4 (four) hours as needed for severe pain.   potassium chloride SA 20 MEQ tablet Commonly known as: K-DUR Take 1 tablet (20 mEq total) by mouth daily. For one week then stop  The patient has been discharged on:   1.Beta Blocker:  Yes [ x  ]                              No   [   ]                              If No, reason:  2.Ace Inhibitor/ARB: Yes [  x ]                                     No  [    ]                                     If No, reason:  3.Statin:   Yes [ x  ]                  No  [   ]                  If No, reason:  4.Ecasa:  Yes  [  x ]                  No   [   ]                  If No, reason:  Follow-up Information    [name], MD. Go on 08/21/2019.   Specialty: Thoracic Surgery Why: Appointment time is at 2:00 pm Contact information: [place] [address] [phone]        Lendon Colonel, NP. Go on 08/31/2019.   Specialties: Nurse Practitioner, Radiology, Cardiology Why: Appointment time is at 9:45 am Contact information: [place] [address] [phone]        Lawerance Cruel, MD. Call.   Specialty: Family Medicine Why: for a follow up appointment  regarding further diabetes management and surveillance of HGA1C 8.1 Contact information: [place] [address] [phone]           Signed: Nani Skillern PA-C 08/17/2019, 11:00 AM

## 2019-08-13 NOTE — Discharge Instructions (Signed)

## 2019-08-13 NOTE — Anesthesia Procedure Notes (Signed)
Central Venous Catheter Insertion Performed by: Suzette Battiest, MD, anesthesiologist Start/End8/27/2020 7:10 AM, 08/13/2019 7:25 AM Patient location: Pre-op. Preanesthetic checklist: patient identified, IV checked, site marked, risks and benefits discussed, surgical consent, monitors and equipment checked, pre-op evaluation, timeout performed and anesthesia consent Position: Trendelenburg Lidocaine 1% used for infiltration and patient sedated Hand hygiene performed , maximum sterile barriers used  and Seldinger technique used Catheter size: 8.5 Fr Total catheter length 10. Central line and PA cath was placed.MAC introducer Swan type:thermodilution PA Cath depth:50 Procedure performed using ultrasound guided technique. Ultrasound Notes:anatomy identified, needle tip was noted to be adjacent to the nerve/plexus identified, no ultrasound evidence of intravascular and/or intraneural injection and image(s) printed for medical record Attempts: 2 Following insertion, line sutured, dressing applied and Biopatch. Post procedure assessment: blood return through all ports, free fluid flow and no air  Patient tolerated the procedure well with no immediate complications.

## 2019-08-13 NOTE — Progress Notes (Signed)
Patient ID: Anthony Alvarez, male   DOB: 12/30/1955, 63 y.o.   MRN: [id number]  TCTS Evening Rounds:   Hemodynamically stable  CI = 2.5  Has started to wake up on vent.  Urine output good  CT output low  CBC    Component Value Date/Time   WBC 20.9 (H) 08/13/2019 1343   RBC 4.20 (L) 08/13/2019 1343   HGB 12.6 (L) 08/13/2019 1343   HCT 37.3 (L) 08/13/2019 1343   PLT 232 08/13/2019 1343   MCV 88.8 08/13/2019 1343   MCH 30.0 08/13/2019 1343   MCHC 33.8 08/13/2019 1343   RDW 12.8 08/13/2019 1343     BMET    Component Value Date/Time   NA 141 08/13/2019 1223   K 3.6 08/13/2019 1223   CL 105 08/13/2019 0502   CO2 23 08/13/2019 0502   GLUCOSE 132 (H) 08/13/2019 1217   BUN 15 08/13/2019 0502   CREATININE 0.96 08/13/2019 0502   CALCIUM 8.8 (L) 08/13/2019 0502   GFRNONAA >60 08/13/2019 0502   GFRAA >60 08/13/2019 0502     A/P:  Stable postop course. Continue current plans. Wean vent to extubate when ready.

## 2019-08-13 NOTE — Progress Notes (Signed)
     [address]       [place] [address]             [phone]       No events overnight  Vitals:   08/12/19 2057 08/13/19 0443  BP: (!) 143/68 115/61  Pulse: 68 (!) 57  Resp: 20 18  Temp: 98.1 F (36.7 C) 98.2 F (36.8 C)  SpO2: 98% 100%   Alert NAD Sinus brady EWOB  Labs reviewed  CAD OR today for CABG 2-3

## 2019-08-13 NOTE — Anesthesia Procedure Notes (Signed)
Procedure Name: Intubation Date/Time: 08/13/2019 8:09 AM Performed by: Gaylene Brooks, CRNA Pre-anesthesia Checklist: Patient identified, Emergency Drugs available, Suction available and Patient being monitored Patient Re-evaluated:Patient Re-evaluated prior to induction Oxygen Delivery Method: Circle System Utilized Preoxygenation: Pre-oxygenation with 100% oxygen Induction Type: IV induction Ventilation: Mask ventilation without difficulty Laryngoscope Size: Miller and 2 Grade View: Grade II Tube type: Oral Tube size: 8.0 mm Number of attempts: 1 Airway Equipment and Method: Stylet and Oral airway Placement Confirmation: ETT inserted through vocal cords under direct vision,  positive ETCO2 and breath sounds checked- equal and bilateral Secured at: 22 cm Tube secured with: Tape Dental Injury: Teeth and Oropharynx as per pre-operative assessment  Comments: DL with Miller 2. Grade 2 view. ETT advanced but bounced off into esophagus. Quickly discovered and removed. DL again with similar view. ETT advanced through cords. +ETCO2, BBS=.  OGT placed to decompress stomach.

## 2019-08-13 NOTE — Anesthesia Preprocedure Evaluation (Signed)
Anesthesia Evaluation  Patient identified by MRN, date of birth, ID band Patient awake    Reviewed: Allergy & Precautions, NPO status , Patient's Chart, lab work & pertinent test results  History of Anesthesia Complications Negative for: history of anesthetic complications  Airway Mallampati: III  TM Distance: >3 FB Neck ROM: Full    Dental  (+) Dental Advisory Given   Pulmonary neg COPD, neg recent URI, former smoker,    breath sounds clear to auscultation       Cardiovascular + angina + CAD and + Past MI   Rhythm:Regular     Neuro/Psych negative neurological ROS  negative psych ROS   GI/Hepatic negative GI ROS, Neg liver ROS,   Endo/Other  negative endocrine ROS  Renal/GU negative Renal ROS     Musculoskeletal negative musculoskeletal ROS (+)   Abdominal   Peds  Hematology negative hematology ROS (+)   Anesthesia Other Findings   Reproductive/Obstetrics                             Anesthesia Physical Anesthesia Plan  ASA: IV  Anesthesia Plan: General   Post-op Pain Management:    Induction: Intravenous  PONV Risk Score and Plan: 2 and Ondansetron and Dexamethasone  Airway Management Planned: Oral ETT  Additional Equipment: Arterial line, CVP, PA Cath, TEE and Ultrasound Guidance Line Placement  Intra-op Plan:   Post-operative Plan: Possible Post-op intubation/ventilation  Informed Consent: I have reviewed the patients History and Physical, chart, labs and discussed the procedure including the risks, benefits and alternatives for the proposed anesthesia with the patient or authorized representative who has indicated his/her understanding and acceptance.     Dental advisory given  Plan Discussed with: CRNA and Surgeon  Anesthesia Plan Comments:         Anesthesia Quick Evaluation

## 2019-08-13 NOTE — Anesthesia Procedure Notes (Signed)
Arterial Line Insertion Start/End8/27/2020 6:55 AM, 08/13/2019 7:05 AM Performed by: Gaylene Brooks, CRNA  Patient location: Pre-op. Preanesthetic checklist: patient identified, IV checked, site marked, risks and benefits discussed, surgical consent, monitors and equipment checked, pre-op evaluation, timeout performed and anesthesia consent Lidocaine 1% used for infiltration and patient sedated Left, radial was placed Catheter size: 20 G Hand hygiene performed  and maximum sterile barriers used   Attempts: 1 Procedure performed without using ultrasound guided technique. Following insertion, dressing applied and Biopatch. Post procedure assessment: normal  Patient tolerated the procedure well with no immediate complications.

## 2019-08-13 NOTE — Procedures (Signed)
Extubation Procedure Note  Patient Details:   Name: Anthony Alvarez DOB: December 14, 1956 MRN: [id number]   Airway Documentation:    Vent end date: 08/13/19 Vent end time: 1730   Evaluation  O2 sats: stable throughout Complications: No apparent complications Patient did tolerate procedure well. Bilateral Breath Sounds: Clear, Diminished   Yes.  Pt extubated per physician order. Pt NIF -30 and vital capacity 1013ml. Pt with good cough, no stridor and able to speak name. Pt placed on 2L nasal cannula and IS started. RT will continue to monitor.   Sharla Kidney 08/13/2019, 5:45 PM

## 2019-08-13 NOTE — Anesthesia Procedure Notes (Signed)
Central Venous Catheter Insertion Performed by: Suzette Battiest, MD, anesthesiologist Start/End8/27/2020 7:10 AM, 08/13/2019 7:25 AM Patient location: Pre-op. Preanesthetic checklist: patient identified, IV checked, site marked, risks and benefits discussed, surgical consent, monitors and equipment checked, pre-op evaluation, timeout performed and anesthesia consent Hand hygiene performed  and maximum sterile barriers used  PA cath was placed.Swan type:thermodilution PA Cath depth:50 Procedure performed without using ultrasound guided technique. Attempts: 1 Patient tolerated the procedure well with no immediate complications.

## 2019-08-13 NOTE — Transfer of Care (Signed)
Immediate Anesthesia Transfer of Care Note  Patient: Anthony Alvarez  Procedure(s) Performed: OFF PUMP CORONARY ARTERY BYPASS GRAFTING (CABG) x 2 WITH ENDOSCOPIC HARVESTING OF RIGHT GREATER SAPHENOUS VEIN (N/A Chest) TRANSESOPHAGEAL ECHOCARDIOGRAM (TEE) (N/A )  Patient Location: ICU  Anesthesia Type:General  Level of Consciousness: sedated and Patient remains intubated per anesthesia plan  Airway & Oxygen Therapy: Patient remains intubated per anesthesia plan and Patient placed on Ventilator (see vital sign flow sheet for setting)  Post-op Assessment: Report given to RN and Post -op Vital signs reviewed and stable  Post vital signs: Reviewed and stable  Last Vitals:  Vitals Value Taken Time  BP    Temp    Pulse    Resp    SpO2      Last Pain:  Vitals:   08/13/19 0443  TempSrc: Oral  PainSc:       Patients Stated Pain Goal: 0 (66/06/30 1601)  Complications: No apparent anesthesia complications

## 2019-08-13 NOTE — Brief Op Note (Signed)
08/10/2019 - 08/13/2019  1:44 PM  PATIENT:  Anthony Alvarez  63 y.o. male  PRE-OPERATIVE DIAGNOSIS:  CAD  POST-OPERATIVE DIAGNOSIS:  CAD  PROCEDURE:  Procedure(s):  OFF PUMP CORONARY ARTERY BYPASS GRAFTING x 3 -LIMA to LAD -RSVG to PDA -RSVG to PLVB  ENDOSCOPIC HARVEST GREATER SAPHENOUS VEIN -Right Leg  TRANSESOPHAGEAL ECHOCARDIOGRAM (TEE) (N/A)  SURGEON:  Surgeon(s) and Role:    * Lightfoot, Lucile Crater, MD - Primary  PHYSICIAN ASSISTANT: Ellwood Handler PA-C  ANESTHESIA:   general  EBL:  350 mL   BLOOD ADMINISTERED: CELLSAVER  DRAINS: Left Pleural Chest Tube, Mediastinal Chest Drains   LOCAL MEDICATIONS USED:  NONE  SPECIMEN:  No Specimen  DISPOSITION OF SPECIMEN:  N/A  COUNTS:  YES  TOURNIQUET:  * No tourniquets in log *  DICTATION: .Dragon Dictation  PLAN OF CARE: Admit to inpatient   PATIENT DISPOSITION:  ICU - intubated and hemodynamically stable.   Delay start of Pharmacological VTE agent (>24hrs) due to surgical blood loss or risk of bleeding: yes

## 2019-08-13 NOTE — Op Note (Signed)
[address]       [place], [address]             [phone]                                         08/13/2019    Patient:  Anthony Alvarez Pre-Op Dx: 2V CAD, NSTEMI   Post-op Dx:  same Procedure: Off pump CABG X 3.  LIMA LAD, RSVG PDA, PLV   Endoscopic greater saphenous vein harvest on the right Intra-operative Transesophageal Echocardiogram  Surgeon and Role:      * Dilara Navarrete, Lucile Crater, MD - Primary    * E. Barrett, PA-C - assisting   Anesthesia  general EBL:  500 ml Blood Administration: none  Drains: 20 F blake drain:  R, L, mediastinal  Wires: none Counts: correct   Indications: 63 yo male admitted from the ED with chest pain.  He underwent a LHC which shows 2V CAD.  CTS was consulted to assist with management.  He began noticing symptoms in January of this year.  He has had progressive dyspnea with exertion over the last several months.  His first episode of chest pain was about 3-4 weeks ago.  Findings: Intra-myocardial LAD.  Very small.  Good LIMA conduit.  Good vein.  Good PDA target, calcified PLV artery, descent target.  Improved function post bypass.    Operative Technique: After the risks, benefits and alternatives were thoroughly discussed, the patient was brought to the operative theatre.  All invasive lines were placed in pre-op holding.  Anesthesia was induced, and the patient was prepped and draped in normal sterile fashion.  An appropriate surgical pause was performed, and pre-operative antibiotics were dosed accordingly.  We began with simultaneous incisions were made along the right leg for harvesting of the greater saphenous vein and the chest for the sternotomy.  In regards to the sternotomy, this was carried down with bovie cautery, and the sternum was divided with a reciprocating saw.  Meticulous hemostasis was obtained.  The left internal thoracic artery was exposed and harvested in in pedicled fashion.  The patient was  systemically heparinized, and the artery was divided distally, and placed in a papaverine sponge.    The sternal elevator was removed, and a retractor was placed.  The pericardium was divided in the midline and fashioned into a cradle with pericardial stitches.   We exposed the anterior wall of the heart.  The LAD was intramyocardial, and small.  After checking in multiple locations, we found a small target distally.  The heart was stabilized, and an arteriotomy was created.  We fashioned an end to side anastomosis between it and the LITA.  We next exposed the posterior wall and identified a good target on PDA, and PLV.  An end to side anastomosis between it and reverse saphenous veins.  Meticulous hemostasis was obtained.    A partial occludding clamp was then placed on the ascending aorta, and we created an end to side anastomosis between it and the proximal vein grafts.  The proximal sites were marked with rings.  Hemostasis was obtained, and the heparin was reversed with protamine.  Chest tubes and wires were placed, and the sternum was re-approximated with with sternal wires.  The soft tissue and skin were re-approximated wth absorbable suture.    The patient tolerated the procedure without  any immediate complications, and was transferred to the ICU in guarded condition.  Anthony Alvarez Anthony Alvarez

## 2019-08-14 ENCOUNTER — Inpatient Hospital Stay (HOSPITAL_COMMUNITY): Payer: Medicare Other

## 2019-08-14 ENCOUNTER — Encounter (HOSPITAL_COMMUNITY): Payer: Self-pay | Admitting: Thoracic Surgery (Cardiothoracic Vascular Surgery)

## 2019-08-14 DIAGNOSIS — Z951 Presence of aortocoronary bypass graft: Secondary | ICD-10-CM

## 2019-08-14 DIAGNOSIS — I10 Essential (primary) hypertension: Secondary | ICD-10-CM

## 2019-08-14 LAB — GLUCOSE, CAPILLARY
Glucose-Capillary: 106 mg/dL — ABNORMAL HIGH (ref 70–99)
Glucose-Capillary: 107 mg/dL — ABNORMAL HIGH (ref 70–99)
Glucose-Capillary: 109 mg/dL — ABNORMAL HIGH (ref 70–99)
Glucose-Capillary: 112 mg/dL — ABNORMAL HIGH (ref 70–99)
Glucose-Capillary: 120 mg/dL — ABNORMAL HIGH (ref 70–99)
Glucose-Capillary: 144 mg/dL — ABNORMAL HIGH (ref 70–99)
Glucose-Capillary: 148 mg/dL — ABNORMAL HIGH (ref 70–99)
Glucose-Capillary: 151 mg/dL — ABNORMAL HIGH (ref 70–99)
Glucose-Capillary: 158 mg/dL — ABNORMAL HIGH (ref 70–99)
Glucose-Capillary: 161 mg/dL — ABNORMAL HIGH (ref 70–99)
Glucose-Capillary: 166 mg/dL — ABNORMAL HIGH (ref 70–99)
Glucose-Capillary: 180 mg/dL — ABNORMAL HIGH (ref 70–99)
Glucose-Capillary: 95 mg/dL (ref 70–99)
Glucose-Capillary: 96 mg/dL (ref 70–99)
Glucose-Capillary: 98 mg/dL (ref 70–99)

## 2019-08-14 LAB — CBC
HCT: 37.4 % — ABNORMAL LOW (ref 39.0–52.0)
HCT: 37.8 % — ABNORMAL LOW (ref 39.0–52.0)
Hemoglobin: 12.5 g/dL — ABNORMAL LOW (ref 13.0–17.0)
Hemoglobin: 12.7 g/dL — ABNORMAL LOW (ref 13.0–17.0)
MCH: 29.6 pg (ref 26.0–34.0)
MCH: 29.7 pg (ref 26.0–34.0)
MCHC: 33.4 g/dL (ref 30.0–36.0)
MCHC: 33.6 g/dL (ref 30.0–36.0)
MCV: 88.4 fL (ref 80.0–100.0)
MCV: 88.5 fL (ref 80.0–100.0)
Platelets: 255 10*3/uL (ref 150–400)
Platelets: 212 10*3/uL (ref 150–400)
RBC: 4.23 MIL/uL (ref 4.22–5.81)
RBC: 4.27 MIL/uL (ref 4.22–5.81)
RDW: 12.8 % (ref 11.5–15.5)
RDW: 12.9 % (ref 11.5–15.5)
WBC: 16.6 10*3/uL — ABNORMAL HIGH (ref 4.0–10.5)
WBC: 17.3 10*3/uL — ABNORMAL HIGH (ref 4.0–10.5)
nRBC: 0 % (ref 0.0–0.2)
nRBC: 0 % (ref 0.0–0.2)

## 2019-08-14 LAB — BASIC METABOLIC PANEL
Anion gap: 8 (ref 5–15)
Anion gap: 11 (ref 5–15)
BUN: 12 mg/dL (ref 8–23)
BUN: 12 mg/dL (ref 8–23)
CO2: 20 mmol/L — ABNORMAL LOW (ref 22–32)
CO2: 19 mmol/L — ABNORMAL LOW (ref 22–32)
Calcium: 8.2 mg/dL — ABNORMAL LOW (ref 8.9–10.3)
Calcium: 8.3 mg/dL — ABNORMAL LOW (ref 8.9–10.3)
Chloride: 108 mmol/L (ref 98–111)
Chloride: 103 mmol/L (ref 98–111)
Creatinine, Ser: 0.95 mg/dL (ref 0.61–1.24)
Creatinine, Ser: 0.9 mg/dL (ref 0.61–1.24)
GFR calc Af Amer: >60 mL/min (ref >60)
GFR calc Af Amer: >60 mL/min (ref >60)
GFR calc non Af Amer: >60 mL/min (ref >60)
GFR calc non Af Amer: >60 mL/min (ref >60)
Glucose, Bld: 109 mg/dL — ABNORMAL HIGH (ref 70–99)
Glucose, Bld: 150 mg/dL — ABNORMAL HIGH (ref 70–99)
Potassium: 4.2 mmol/L (ref 3.5–5.1)
Potassium: 3.6 mmol/L (ref 3.5–5.1)
Sodium: 136 mmol/L (ref 135–145)
Sodium: 133 mmol/L — ABNORMAL LOW (ref 135–145)

## 2019-08-14 LAB — POCT I-STAT 4, (NA,K, GLUC, HGB,HCT)
Glucose, Bld: 101 mg/dL — ABNORMAL HIGH (ref 70–99)
HCT: 35 % — ABNORMAL LOW (ref 39.0–52.0)
Hemoglobin: 11.9 g/dL — ABNORMAL LOW (ref 13.0–17.0)
Potassium: 3.1 mmol/L — ABNORMAL LOW (ref 3.5–5.1)
Sodium: 141 mmol/L (ref 135–145)

## 2019-08-14 LAB — MAGNESIUM
Magnesium: 2.4 mg/dL (ref 1.7–2.4)
Magnesium: 2.2 mg/dL (ref 1.7–2.4)

## 2019-08-14 MED ORDER — CARVEDILOL 3.125 MG PO TABS
3.1250 mg | ORAL_TABLET | Freq: Two times a day (BID) | ORAL | Status: DC
  Administered 2019-08-14 – 2019-08-15 (×2): 3.125 mg via ORAL
  Filled 2019-08-14 (×2): qty 1

## 2019-08-14 MED ORDER — POTASSIUM CHLORIDE 10 MEQ/50ML IV SOLN
10.0000 meq | INTRAVENOUS | Status: AC
  Administered 2019-08-14 (×3): 10 meq via INTRAVENOUS
  Filled 2019-08-14 (×3): qty 50

## 2019-08-14 MED ORDER — POTASSIUM CHLORIDE 10 MEQ/100ML IV SOLN: 10.0000 meq | INTRAVENOUS | Status: DC

## 2019-08-14 MED ORDER — POTASSIUM CHLORIDE 10 MEQ/50ML IV SOLN: 10.0000 meq | INTRAVENOUS | Status: DC

## 2019-08-14 MED ORDER — LOSARTAN POTASSIUM 25 MG PO TABS
25.0000 mg | ORAL_TABLET | Freq: Every day | ORAL | Status: DC
  Administered 2019-08-14 – 2019-08-17 (×4): 25 mg via ORAL
  Filled 2019-08-14 (×4): qty 1

## 2019-08-14 MED ORDER — INSULIN ASPART 100 UNIT/ML ~~LOC~~ SOLN
0.0000 [IU] | SUBCUTANEOUS | Status: DC
  Administered 2019-08-14 (×2): 2 [IU] via SUBCUTANEOUS
  Administered 2019-08-15: 8 [IU] via SUBCUTANEOUS
  Administered 2019-08-15: 05:00:00 2 [IU] via SUBCUTANEOUS

## 2019-08-14 MED FILL — Potassium Chloride Inj 2 mEq/ML: INTRAVENOUS | Qty: 40 | Status: AC

## 2019-08-14 MED FILL — Dexmedetomidine HCl in NaCl 0.9% IV Soln 400 MCG/100ML: INTRAVENOUS | Qty: 100 | Status: AC

## 2019-08-14 MED FILL — Heparin Sodium (Porcine) Inj 1000 Unit/ML: INTRAMUSCULAR | Qty: 30 | Status: AC

## 2019-08-14 MED FILL — Magnesium Sulfate Inj 50%: INTRAMUSCULAR | Qty: 10 | Status: AC

## 2019-08-14 NOTE — Progress Notes (Signed)
Completing rounds and offered prayer.

## 2019-08-14 NOTE — Progress Notes (Signed)
Progress Note  Patient Name: Anthony Alvarez Date of Encounter: 08/14/2019  Primary Cardiologist: Glenetta Hew, MD   Subjective   Postop day #1:   Off pump CABG X 3. LIMA LAD, RSVG PDA, PLV    Endoscopic greater saphenous vein harvest on the right   Intra-operative Transesophageal Echocardiogram   Somewhat groggy, but arousable.  Musculoskeletal chest wall pain, no anginal pain.  No dyspnea.  Inpatient Medications    Scheduled Meds: . acetaminophen  1,000 mg Oral Q6H   Or  . acetaminophen (TYLENOL) oral liquid 160 mg/5 mL  1,000 mg Per Tube Q6H  . aspirin EC  325 mg Oral Daily   Or  . aspirin  324 mg Per Tube Daily  . atorvastatin  80 mg Oral q1800  . bisacodyl  10 mg Oral Daily   Or  . bisacodyl  10 mg Rectal Daily  . carvedilol  3.125 mg Oral BID WC  . chlorhexidine gluconate (MEDLINE KIT)  15 mL Mouth Rinse BID  . Chlorhexidine Gluconate Cloth  6 each Topical Daily  . docusate sodium  200 mg Oral Daily  . insulin regular  0-10 Units Intravenous TID WC  . losartan  25 mg Oral Daily  . metoCLOPramide (REGLAN) injection  10 mg Intravenous Q6H  . mupirocin ointment  1 application Nasal BID  . [START ON 08/15/2019] pantoprazole  40 mg Oral Daily  . sodium chloride flush  10-40 mL Intracatheter Q12H   Continuous Infusions: . sodium chloride 20 mL/hr at 08/14/19 0700  . sodium chloride    . sodium chloride 20 mL/hr at 08/13/19 2300  . albumin human 12.5 g (08/13/19 1815)  . cefUROXime (ZINACEF)  IV Stopped (08/14/19 0444)  . dexmedetomidine (PRECEDEX) IV infusion Stopped (08/13/19 1525)  . famotidine (PEPCID) IV Stopped (08/13/19 1358)  . insulin 1.2 mL/hr at 08/14/19 0700  . lactated ringers    . lactated ringers    . lactated ringers 20 mL/hr at 08/14/19 0700  . nitroGLYCERIN 50 mcg/min (08/14/19 0700)  . phenylephrine (NEO-SYNEPHRINE) Adult infusion Stopped (08/13/19 1409)   PRN Meds: sodium chloride, albumin human, lactated ringers, metoprolol tartrate,  midazolam, morphine injection, ondansetron (ZOFRAN) IV, oxyCODONE, sodium chloride flush, traMADol   Vital Signs    Vitals:   08/14/19 0615 08/14/19 0630 08/14/19 0645 08/14/19 0800  BP:    (!) 95/55  Pulse: 87 84 80 80  Resp:      Temp: (!) 101.1 F (38.4 C) (!) 100.9 F (38.3 C) (!) 100.6 F (38.1 C) 99.5 F (37.5 C)  TempSrc:      SpO2: 91% 91% 92% 93%  Weight:      Height:        Intake/Output Summary (Last 24 hours) at 08/14/2019 0919 Last data filed at 08/14/2019 0700 Gross per 24 hour  Intake 4927.94 ml  Output 1995 ml  Net 2932.94 ml   Last 3 Weights 08/14/2019 08/13/2019 08/12/2019  Weight (lbs) 228 lb 9.9 oz 220 lb 9.6 oz 222 lb 8 oz  Weight (kg) 103.7 kg 100.064 kg 100.925 kg      Telemetry    SR at 70s-80s - Personally Reviewed  ECG    Sinus rhythm heart rate 70 bpm.  IVCD noted with poor R wave progression suggestive of prior anterior MI.  Also Q waves suggestive of evolutionary inferior STEMI.  Personally Reviewed  Physical Exam   GEN: No acute distress.  Somewhat groggy but arousable. Neck: No JVD visible.  Right IJ central  line in place. Cardiac: RRR, no murmurs, or gallops.  Distant S1-S2.  Soft 2 part rub postop Respiratory: Clear to auscultation bilaterally, with diminished breath sounds in both bases. GI:  Soft, but mildly protuberant.  Not really distended, but somewhat tympanitic MS: No edema; No deformity. Neuro:  Nonfocal  Psych: Normal mood and affect   Labs    High Sensitivity Troponin:   Recent Labs  Lab 08/10/19 1550 08/10/19 1750 08/10/19 2221 08/11/19 0016  TROPONINIHS 1,068* 1,161* 1,357* 1,365*      Chemistry Recent Labs  Lab 08/13/19 0502  08/13/19 1947 08/13/19 1959 08/14/19 0407  NA 138   < > 137 139 136  K 3.8   < > 4.0 4.1 4.2  CL 105  --  108 107 108  CO2 23  --  20*  --  20*  GLUCOSE 142*   < > 122* 119* 109*  BUN 15  --  '14 14 12  '$ CREATININE 0.96  --  0.86 0.70 0.95  CALCIUM 8.8*  --  8.3*  --  8.2*   GFRNONAA >60  --  >60  --  >60  GFRAA >60  --  >60  --  >60  ANIONGAP 10  --  9  --  8   < > = values in this interval not displayed.     Hematology Recent Labs  Lab 08/13/19 1343  08/13/19 1943 08/13/19 1959 08/14/19 0407  WBC 20.9*  --  17.5*  --  16.6*  RBC 4.20*  --  4.40  --  4.23  HGB 12.6*   < > 13.1 12.9* 12.5*  HCT 37.3*   < > 38.0* 38.0* 37.4*  MCV 88.8  --  86.4  --  88.4  MCH 30.0  --  29.8  --  29.6  MCHC 33.8  --  34.5  --  33.4  RDW 12.8  --  12.6  --  12.8  PLT 232  --  240  --  255   < > = values in this interval not displayed.    Radiology    Chest x-ray today: NG tube out, bilateral chest tubes stable.  Swan-Ganz in right main pulmonary artery.  Sternal wires noted.  No pulmonary venous congestion.  Bilateral atelectasis.  Cardiac Studies    Echo 08/11/2019: EF 35%.  GRII DD.  Mid to apical septal akinesis as well as true apical akinesis.  Mid to apical inferior akinesis and akinesis of the apical lateral wall.  Normal RV.  Mild aortic root dilation (42 mm).  Otherwise normal valves.   LEFT HEART CATH AND CORONARY ANGIOGRAPHY 08/11/2019     Mid RCA lesion is 95% stenosed.RV Branch lesion is 95% stenosed.RPAV lesion is 95% stenosed.  Prox LAD to Mid LAD lesion is 100% stenosed.  There is moderate left ventricular systolic dysfunction.  EF 35-45% by visual estimate.  LV end diastolic pressure is moderately elevated.  1. Critical 2 vessel obstructive CAD    - CTO of the proximal LAD. LAD fills late by left to left collaterals    - 95% thrombotic lesion in the mid RCA with distal embolization into the PL branch 2. Moderate LV dysfunction. EF 40% with severe inferior wall HK 3. Moderately elevated LVEDP 24 mm Hg.  Plan: resume IV heparin. Beta blocker, statin, ASA. Recommend CT surgery consultation for consideration of CABG.    Off-pump CABG x3 08/13/2019: . LIMA LAD, RSVG PDA, PLV    Patient Profile  63 y.o. male with hx of  HLD admitted  with NSTEMI and found to have 2 V CAD. -->  Current presentation probably more consistent with post infarct angina/ACS with likely index event 1 week preceding his admission which is probably more consistent with when his RCA occlusion..  Now somewhat recanalized. Underwent three-vessel CABG on 08/13/2019  Assessment & Plan    1. NSTEMI -- Cath showed multivessel CAD as above.  Peak troponin was at 1400.  Underwent CABG x3 08/13/2019 - Continue ASA, statin, BB -> would convert from Lopressor to carvedilol given reduced EF.  Start carvedilol 3.125 mg twice daily today along with 25 mg losartan --Consider restarting Plavix prior to discharge (as presentation was ACS) -> in this case would reduce aspirin to 81 mg daily.  2. ICM -- Echo showed LVEF of 35%, grade 2 DD and WM abnormality.euvolemic on exam.  No active signs of heart failure, but with new diagnosis would have to consider to be new/acute combined systolic and diastolic heart failure  - Appears euvolemic -diuresis per surgery - Continue BB -convert to carvedilol -3.125 mg twice daily today and titrate as blood pressure and heart rate tolerate - Add losartan 25 mg a day, depending on blood pressure, may convert to [place] prior to discharge  3. New dx of DM - SSI while here.  -Consider SPGL2 inhibitor on discharge  4. HLD: - 08/11/2019: Cholesterol 124; HDL 21; LDL Cholesterol 54; Triglycerides 246; VLDL 49  - Continue high intensity statin - LFTs and Lipid panel in 6 weeks   5.  Essential hypertension: Blood pressures quite high.  Currently on nitroglycerin drip.  Will convert beta-blocker to carvedilol 3.125 mg twice daily and titrate as tolerated, also start low-dose losartan 25 mg daily.  Consider either titrating up or converting to [facility] prior to discharge.  Cardiology will continue to follow along given his cardiomyopathy.   For questions or updates, please contact [place] Please consult [email or web address] for contact info  under        Signed, Glenetta Hew, MD , M.S. Interventional Cardiologist  08/14/2019, 9:19 AM

## 2019-08-14 NOTE — Anesthesia Postprocedure Evaluation (Signed)
Anesthesia Post Note  Patient: Anthony Alvarez  Procedure(s) Performed: OFF PUMP CORONARY ARTERY BYPASS GRAFTING (CABG) x 2 WITH ENDOSCOPIC HARVESTING OF RIGHT GREATER SAPHENOUS VEIN (N/A Chest) TRANSESOPHAGEAL ECHOCARDIOGRAM (TEE) (N/A )     Patient location during evaluation: SICU Anesthesia Type: General Level of consciousness: sedated Pain management: pain level controlled Vital Signs Assessment: post-procedure vital signs reviewed and stable Respiratory status: patient remains intubated per anesthesia plan Cardiovascular status: stable Postop Assessment: no apparent nausea or vomiting Anesthetic complications: no    Last Vitals:  Vitals:   08/14/19 0630 08/14/19 0645  BP:    Pulse: 84 80  Resp:    Temp: (!) 38.3 C (!) 38.1 C  SpO2: 91% 92%    Last Pain:  Vitals:   08/14/19 0550  TempSrc:   PainSc: 2                  Keilen Kahl

## 2019-08-14 NOTE — Progress Notes (Signed)
      [address]       [place] [address]             [phone]                 1 Day Post-Op Procedure(s) (LRB): OFF PUMP CORONARY ARTERY BYPASS GRAFTING (CABG) x 2 WITH ENDOSCOPIC HARVESTING OF RIGHT GREATER SAPHENOUS VEIN (N/A) TRANSESOPHAGEAL ECHOCARDIOGRAM (TEE) (N/A)   Events: Doing well.  Extubated yesterday evening _______________________________________________________________ Vitals: BP 109/65   Pulse 79   Temp 99.5 F (37.5 C)   Resp (!) 32   Ht 5\' 11"  (1.803 m)   Wt 103.7 kg   SpO2 90%   BMI 31.89 kg/m   - Neuro: alert NAD  - Cardiovascular: sinus  Drips: none.  PAP: (19-57)/(9-30) 29/13 CO:  [5.1 L/min-5.9 L/min] 5.1 L/min CI:  [2.3 L/min/m2-2.7 L/min/m2] 2.3 L/min/m2  - Pulm: EWOB.  SS CT output  ABG    Component Value Date/Time   PHART 7.361 08/13/2019 1848   PCO2ART 34.7 08/13/2019 1848   PO2ART 75.0 (L) 08/13/2019 1848   HCO3 19.4 (L) 08/13/2019 1848   TCO2 19 (L) 08/13/2019 1959   ACIDBASEDEF 5.0 (H) 08/13/2019 1848   O2SAT 94.0 08/13/2019 1848    - Abd: soft, NG - Extremity: trace edema  .Intake/Output      08/27 0701 - 08/28 0700 08/28 0701 - 08/29 0700   P.O. 120 120   I.V. (mL/kg) 3937.9 (38) 265.2 (2.6)   Blood 175    IV Piggyback 695    Total Intake(mL/kg) 4927.9 (47.5) 385.2 (3.7)   Urine (mL/kg/hr) 1390 (0.6) 275 (0.3)   Blood 350    Chest Tube 530 20   Total Output 2270 295   Net +2657.9 +90.2           _______________________________________________________________ Labs: CBC Latest Ref Rng & Units 08/14/2019 08/13/2019 08/13/2019  WBC 4.0 - 10.5 K/uL 16.6(H) - 17.5(H)  Hemoglobin 13.0 - 17.0 g/dL 12.5(L) 12.9(L) 13.1  Hematocrit 39.0 - 52.0 % 37.4(L) 38.0(L) 38.0(L)  Platelets 150 - 400 K/uL 255 - 240   CMP Latest Ref Rng & Units 08/14/2019 08/13/2019 08/13/2019  Glucose 70 - 99 mg/dL 109(H) 119(H) 122(H)  BUN 8 - 23 mg/dL 12 14 14   Creatinine 0.61 - 1.24 mg/dL 0.95 0.70 0.86  Sodium 135 - 145  mmol/L 136 139 137  Potassium 3.5 - 5.1 mmol/L 4.2 4.1 4.0  Chloride 98 - 111 mmol/L 108 107 108  CO2 22 - 32 mmol/L 20(L) - 20(L)  Calcium 8.9 - 10.3 mg/dL 8.2(L) - 8.3(L)    CXR: clear  _______________________________________________________________  Assessment and Plan: POD 1 s/p CABG 3.    Neuro: pain controlled CV: on asp, statin, BB.  Started on losartan for BP.  Pre-op EF 35%.  Heart failure on board Pulm: pulm toilet, ambulation Renal: will diurese tomorro GI: advance diet Heme: stable ID: [email or web address] Endo: SSI Dispo: floor tomorrow  Melodie Bouillon, MD 08/14/2019 3:11 PM

## 2019-08-14 NOTE — Progress Notes (Signed)
      [address]       [place] [address]             [phone]      POD # 1 off pump CABG  Comfortable BP 109/62 (BP Location: Right Arm)   Pulse 83   Temp 98.2 F (36.8 C) (Oral)   Resp (!) 32   Ht 5\' 11"  (1.803 m)   Wt 103.7 kg   SpO2 95%   BMI 31.89 kg/m   Intake/Output Summary (Last 24 hours) at 08/14/2019 1732 Last data filed at 08/14/2019 1459 Gross per 24 hour  Intake 2138.1 ml  Output 1275 ml  Net 863.1 ml   CBG well controlled  Remo Lipps C. Roxan Hockey, MD [facility] [phone]

## 2019-08-15 ENCOUNTER — Inpatient Hospital Stay (HOSPITAL_COMMUNITY): Payer: Medicare Other

## 2019-08-15 DIAGNOSIS — I5023 Acute on chronic systolic (congestive) heart failure: Secondary | ICD-10-CM

## 2019-08-15 LAB — CBC
HCT: 37 % — ABNORMAL LOW (ref 39.0–52.0)
Hemoglobin: 12.5 g/dL — ABNORMAL LOW (ref 13.0–17.0)
MCH: 29.9 pg (ref 26.0–34.0)
MCHC: 33.8 g/dL (ref 30.0–36.0)
MCV: 88.5 fL (ref 80.0–100.0)
Platelets: 204 10*3/uL (ref 150–400)
RBC: 4.18 MIL/uL — ABNORMAL LOW (ref 4.22–5.81)
RDW: 12.9 % (ref 11.5–15.5)
WBC: 15 10*3/uL — ABNORMAL HIGH (ref 4.0–10.5)
nRBC: 0 % (ref 0.0–0.2)

## 2019-08-15 LAB — BASIC METABOLIC PANEL
Anion gap: 9 (ref 5–15)
BUN: 11 mg/dL (ref 8–23)
CO2: 20 mmol/L — ABNORMAL LOW (ref 22–32)
Calcium: 8.2 mg/dL — ABNORMAL LOW (ref 8.9–10.3)
Chloride: 105 mmol/L (ref 98–111)
Creatinine, Ser: 0.67 mg/dL (ref 0.61–1.24)
GFR calc Af Amer: >60 mL/min (ref >60)
GFR calc non Af Amer: >60 mL/min (ref >60)
Glucose, Bld: 164 mg/dL — ABNORMAL HIGH (ref 70–99)
Potassium: 3.9 mmol/L (ref 3.5–5.1)
Sodium: 134 mmol/L — ABNORMAL LOW (ref 135–145)

## 2019-08-15 LAB — GLUCOSE, CAPILLARY
Glucose-Capillary: 159 mg/dL — ABNORMAL HIGH (ref 70–99)
Glucose-Capillary: 202 mg/dL — ABNORMAL HIGH (ref 70–99)
Glucose-Capillary: 205 mg/dL — ABNORMAL HIGH (ref 70–99)
Glucose-Capillary: 188 mg/dL — ABNORMAL HIGH (ref 70–99)
Glucose-Capillary: 201 mg/dL — ABNORMAL HIGH (ref 70–99)

## 2019-08-15 MED ORDER — INSULIN ASPART 100 UNIT/ML ~~LOC~~ SOLN
0.0000 [IU] | Freq: Three times a day (TID) | SUBCUTANEOUS | Status: DC
  Administered 2019-08-15: 17:00:00 3 [IU] via SUBCUTANEOUS
  Administered 2019-08-15 – 2019-08-16 (×2): 5 [IU] via SUBCUTANEOUS
  Administered 2019-08-16: 17:00:00 3 [IU] via SUBCUTANEOUS
  Administered 2019-08-16 – 2019-08-17 (×2): 2 [IU] via SUBCUTANEOUS

## 2019-08-15 MED ORDER — MOVING RIGHT ALONG BOOK
Freq: Once | Status: AC
  Administered 2019-08-15: 12:00:00
  Filled 2019-08-15: qty 1

## 2019-08-15 MED ORDER — SODIUM CHLORIDE 0.9% FLUSH: 3.0000 mL | INTRAVENOUS | Status: DC | PRN

## 2019-08-15 MED ORDER — ZOLPIDEM TARTRATE 5 MG PO TABS: 5.0000 mg | ORAL_TABLET | Freq: Every evening | ORAL | Status: DC | PRN

## 2019-08-15 MED ORDER — METFORMIN HCL 500 MG PO TABS
500.0000 mg | ORAL_TABLET | Freq: Two times a day (BID) | ORAL | Status: DC
  Administered 2019-08-15 – 2019-08-16 (×2): 500 mg via ORAL
  Filled 2019-08-15 (×2): qty 1

## 2019-08-15 MED ORDER — SODIUM CHLORIDE 0.9% FLUSH
3.0000 mL | Freq: Two times a day (BID) | INTRAVENOUS | Status: DC
  Administered 2019-08-16 – 2019-08-17 (×3): 3 mL via INTRAVENOUS

## 2019-08-15 MED ORDER — ALUM & MAG HYDROXIDE-SIMETH 200-200-20 MG/5ML PO SUSP: 15.0000 mL | Freq: Four times a day (QID) | ORAL | Status: DC | PRN

## 2019-08-15 MED ORDER — MAGNESIUM HYDROXIDE 400 MG/5ML PO SUSP
30.0000 mL | Freq: Every day | ORAL | Status: DC | PRN
  Administered 2019-08-15: 30 mL via ORAL
  Filled 2019-08-15: qty 30

## 2019-08-15 MED ORDER — SODIUM CHLORIDE 0.9 % IV SOLN: 250.0000 mL | INTRAVENOUS | Status: DC | PRN

## 2019-08-15 MED ORDER — CARVEDILOL 6.25 MG PO TABS
6.2500 mg | ORAL_TABLET | Freq: Two times a day (BID) | ORAL | Status: DC
  Administered 2019-08-15 – 2019-08-17 (×4): 6.25 mg via ORAL
  Filled 2019-08-15 (×4): qty 1

## 2019-08-15 MED ORDER — INSULIN ASPART 100 UNIT/ML ~~LOC~~ SOLN
0.0000 [IU] | Freq: Every day | SUBCUTANEOUS | Status: DC
  Administered 2019-08-15: 23:00:00 2 [IU] via SUBCUTANEOUS

## 2019-08-15 NOTE — Progress Notes (Signed)
Spoke with RN re d/c CVC order.  RN aware. 

## 2019-08-15 NOTE — Progress Notes (Signed)
Progress Note   Subjective   Doing well today.  Denies CP.  Breathing is "a little better".  No new concerns  Inpatient Medications    Scheduled Meds: . acetaminophen  1,000 mg Oral Q6H   Or  . acetaminophen (TYLENOL) oral liquid 160 mg/5 mL  1,000 mg Per Tube Q6H  . aspirin EC  325 mg Oral Daily   Or  . aspirin  324 mg Per Tube Daily  . atorvastatin  80 mg Oral q1800  . bisacodyl  10 mg Oral Daily   Or  . bisacodyl  10 mg Rectal Daily  . carvedilol  6.25 mg Oral BID WC  . chlorhexidine gluconate (MEDLINE KIT)  15 mL Mouth Rinse BID  . docusate sodium  200 mg Oral Daily  . insulin aspart  0-15 Units Subcutaneous TID WC  . insulin aspart  0-5 Units Subcutaneous QHS  . losartan  25 mg Oral Daily  . metFORMIN  500 mg Oral BID WC  . moving right along book   Does not apply Once  . mupirocin ointment  1 application Nasal BID  . pantoprazole  40 mg Oral Daily  . sodium chloride flush  3 mL Intravenous Q12H   Continuous Infusions: . sodium chloride     PRN Meds: sodium chloride, alum & mag hydroxide-simeth, magnesium hydroxide, metoprolol tartrate, ondansetron (ZOFRAN) IV, oxyCODONE, sodium chloride flush, traMADol, zolpidem   Vital Signs    Vitals:   08/15/19 0800 08/15/19 0900 08/15/19 1000 08/15/19 1100  BP: 122/71 113/68 123/74 114/70  Pulse: (!) 107 (!) 105 (!) 111 (!) 105  Resp:      Temp: 98.3 F (36.8 C)     TempSrc: Oral     SpO2: 94% 93% 93% 93%  Weight:      Height:        Intake/Output Summary (Last 24 hours) at 08/15/2019 1204 Last data filed at 08/15/2019 0600 Gross per 24 hour  Intake 816.29 ml  Output 560 ml  Net 256.29 ml   Filed Weights   08/13/19 0443 08/14/19 0600 08/15/19 0500  Weight: 100.1 kg 103.7 kg 103.6 kg    Telemetry    sinus - Personally Reviewed  Physical Exam   GEN- The patient is ill appearing, alert and oriented x 3 today.   Head- normocephalic, atraumatic Eyes-  Sclera clear, conjunctiva pink Ears- hearing intact  Oropharynx- clear Neck- supple, Lungs-  normal work of breathing Heart- Regular rate and rhythm  GI- soft, NT, ND, + BS Extremities- + dependant MS- no significant deformity or atrophy Skin- no rash or lesion Psych- euthymic mood, full affect Neuro- strength and sensation are intact   Labs    Chemistry Recent Labs  Lab 08/14/19 0407 08/14/19 1748 08/15/19 0448  NA 136 133* 134*  K 4.2 3.6 3.9  CL 108 103 105  CO2 20* 19* 20*  GLUCOSE 109* 150* 164*  BUN '12 12 11  '$ CREATININE 0.95 0.90 0.67  CALCIUM 8.2* 8.3* 8.2*  GFRNONAA >60 >60 >60  GFRAA >60 >60 >60  ANIONGAP '8 11 9     '$ Hematology Recent Labs  Lab 08/14/19 0407 08/14/19 1748 08/15/19 0448  WBC 16.6* 17.3* 15.0*  RBC 4.23 4.27 4.18*  HGB 12.5* 12.7* 12.5*  HCT 37.4* 37.8* 37.0*  MCV 88.4 88.5 88.5  MCH 29.6 29.7 29.9  MCHC 33.4 33.6 33.8  RDW 12.8 12.9 12.9  PLT 255 212 204     Patient Profile  63 y.o. male with hx of  HLDadmitted with NSTEMI and found to have 2 V CAD. Underwent three-vessel CABG on 08/13/2019   Assessment & Plan    1.  Ischemic CM/ acute on chronic systolic dysfunction S/p revascularization this admit On beta blocker/ ARB Consider entresto prior to discharge Would follow Is and Os closely Keep negative if able  2. NSTEMI/ multivessel CAD S/p CABG this admission On high intensity statin Per surgical team  3. New dx of DM - SSI while here.  -Consider SPGL2 inhibitor on discharge  4. HTN Stable No change required today   Thompson Grayer MD, [facility] 08/15/2019 12:04 PM

## 2019-08-15 NOTE — Progress Notes (Signed)
2 Days Post-Op Procedure(s) (LRB): OFF PUMP CORONARY ARTERY BYPASS GRAFTING (CABG) x 2 WITH ENDOSCOPIC HARVESTING OF RIGHT GREATER SAPHENOUS VEIN (N/A) TRANSESOPHAGEAL ECHOCARDIOGRAM (TEE) (N/A) Subjective: Some incisional pain  Objective: Vital signs in last 24 hours: Temp:  [97.6 F (36.4 C)-98.6 F (37 C)] 98.3 F (36.8 C) (08/29 0800) Pulse Rate:  [79-107] 107 (08/29 0800) Cardiac Rhythm: Sinus tachycardia;Normal sinus rhythm (08/29 0800) BP: (107-146)/(56-75) 122/71 (08/29 0800) SpO2:  [89 %-96 %] 94 % (08/29 0800) Weight:  [103.6 kg] 103.6 kg (08/29 0500)  Hemodynamic parameters for last 24 hours:    Intake/Output from previous day: 08/28 0701 - 08/29 0700 In: 1027.2 [P.O.:360; I.V.:317.2; IV Piggyback:350] Out: 770 [Urine:750; Chest Tube:20] Intake/Output this shift: No intake/output data recorded.  General appearance: alert, cooperative and no distress Neurologic: intact Heart: regular rate and rhythm Lungs: clear to auscultation bilaterally Abdomen: normal findings: soft, non-tender  Lab Results: Recent Labs    08/14/19 1748 08/15/19 0448  WBC 17.3* 15.0*  HGB 12.7* 12.5*  HCT 37.8* 37.0*  PLT 212 204   BMET:  Recent Labs    08/14/19 1748 08/15/19 0448  NA 133* 134*  K 3.6 3.9  CL 103 105  CO2 19* 20*  GLUCOSE 150* 164*  BUN 12 11  CREATININE 0.90 0.67  CALCIUM 8.3* 8.2*    PT/INR:  Recent Labs    08/13/19 1343  LABPROT 16.1*  INR 1.3*   ABG    Component Value Date/Time   PHART 7.361 08/13/2019 1848   HCO3 19.4 (L) 08/13/2019 1848   TCO2 19 (L) 08/13/2019 1959   ACIDBASEDEF 5.0 (H) 08/13/2019 1848   O2SAT 94.0 08/13/2019 1848   CBG (last 3)  Recent Labs    08/14/19 2332 08/15/19 0422 08/15/19 0758  GLUCAP 151* 159* 202*    Assessment/Plan: S/P Procedure(s) (LRB): OFF PUMP CORONARY ARTERY BYPASS GRAFTING (CABG) x 2 WITH ENDOSCOPIC HARVESTING OF RIGHT GREATER SAPHENOUS VEIN (N/A) TRANSESOPHAGEAL ECHOCARDIOGRAM (TEE)  (N/A) Plan for transfer to step-down: see transfer orders  Doing well CV- mild ST- will increase coreg RESP- continue IS for basilar atelectasis RENAL- creatinine and lytes OK ENDO- CBG elevated, will start metformin PO Continue cardiac rehab transfer to 4E   LOS: 5 days    Melrose Nakayama 08/15/2019

## 2019-08-16 ENCOUNTER — Inpatient Hospital Stay (HOSPITAL_COMMUNITY): Payer: Medicare Other

## 2019-08-16 LAB — TYPE AND SCREEN
ABO/RH(D): B POS
Antibody Screen: NEGATIVE
Unit division: 0
Unit division: 0

## 2019-08-16 LAB — BPAM RBC
Blood Product Expiration Date: 202009162359
Blood Product Expiration Date: 202009162359
Unit Type and Rh: 7300
Unit Type and Rh: 7300

## 2019-08-16 LAB — BASIC METABOLIC PANEL
Anion gap: 10 (ref 5–15)
BUN: 15 mg/dL (ref 8–23)
CO2: 24 mmol/L (ref 22–32)
Calcium: 8.3 mg/dL — ABNORMAL LOW (ref 8.9–10.3)
Chloride: 102 mmol/L (ref 98–111)
Creatinine, Ser: 0.92 mg/dL (ref 0.61–1.24)
GFR calc Af Amer: >60 mL/min (ref >60)
GFR calc non Af Amer: >60 mL/min (ref >60)
Glucose, Bld: 159 mg/dL — ABNORMAL HIGH (ref 70–99)
Potassium: 3.8 mmol/L (ref 3.5–5.1)
Sodium: 136 mmol/L (ref 135–145)

## 2019-08-16 LAB — CBC
HCT: 36.5 % — ABNORMAL LOW (ref 39.0–52.0)
Hemoglobin: 12.7 g/dL — ABNORMAL LOW (ref 13.0–17.0)
MCH: 30 pg (ref 26.0–34.0)
MCHC: 34.8 g/dL (ref 30.0–36.0)
MCV: 86.3 fL (ref 80.0–100.0)
Platelets: 211 10*3/uL (ref 150–400)
RBC: 4.23 MIL/uL (ref 4.22–5.81)
RDW: 12.6 % (ref 11.5–15.5)
WBC: 15.2 10*3/uL — ABNORMAL HIGH (ref 4.0–10.5)
nRBC: 0 % (ref 0.0–0.2)

## 2019-08-16 LAB — GLUCOSE, CAPILLARY
Glucose-Capillary: 145 mg/dL — ABNORMAL HIGH (ref 70–99)
Glucose-Capillary: 211 mg/dL — ABNORMAL HIGH (ref 70–99)
Glucose-Capillary: 171 mg/dL — ABNORMAL HIGH (ref 70–99)
Glucose-Capillary: 143 mg/dL — ABNORMAL HIGH (ref 70–99)

## 2019-08-16 MED ORDER — METFORMIN HCL 850 MG PO TABS
850.0000 mg | ORAL_TABLET | Freq: Two times a day (BID) | ORAL | Status: DC
  Administered 2019-08-16 – 2019-08-17 (×2): 850 mg via ORAL
  Filled 2019-08-16 (×3): qty 1

## 2019-08-16 NOTE — Progress Notes (Signed)
Progress Note   Subjective   Doing well today, the patient denies CP.  SOB continues to improve.  No new concerns  Inpatient Medications    Scheduled Meds: . acetaminophen  1,000 mg Oral Q6H   Or  . acetaminophen (TYLENOL) oral liquid 160 mg/5 mL  1,000 mg Per Tube Q6H  . aspirin EC  325 mg Oral Daily   Or  . aspirin  324 mg Per Tube Daily  . atorvastatin  80 mg Oral q1800  . bisacodyl  10 mg Oral Daily   Or  . bisacodyl  10 mg Rectal Daily  . carvedilol  6.25 mg Oral BID WC  . chlorhexidine gluconate (MEDLINE KIT)  15 mL Mouth Rinse BID  . docusate sodium  200 mg Oral Daily  . insulin aspart  0-15 Units Subcutaneous TID WC  . insulin aspart  0-5 Units Subcutaneous QHS  . losartan  25 mg Oral Daily  . metFORMIN  850 mg Oral BID WC  . mupirocin ointment  1 application Nasal BID  . pantoprazole  40 mg Oral Daily  . sodium chloride flush  3 mL Intravenous Q12H   Continuous Infusions: . sodium chloride     PRN Meds: sodium chloride, alum & mag hydroxide-simeth, magnesium hydroxide, metoprolol tartrate, ondansetron (ZOFRAN) IV, oxyCODONE, sodium chloride flush, traMADol, zolpidem   Vital Signs    Vitals:   08/15/19 2305 08/16/19 0318 08/16/19 0818 08/16/19 1156  BP: 126/76 108/62 131/65 121/62  Pulse: 100 87 87 90  Resp: (!) 26 (!) 21 (!) 21 (!) 25  Temp: 98 F (36.7 C) 97.9 F (36.6 C) 98.4 F (36.9 C) 99.3 F (37.4 C)  TempSrc: Oral Oral Oral Oral  SpO2: 95% 94% 96% 95%  Weight:  103.4 kg    Height:        Intake/Output Summary (Last 24 hours) at 08/16/2019 1325 Last data filed at 08/15/2019 2031 Gross per 24 hour  Intake 480 ml  Output 300 ml  Net 180 ml   Filed Weights   08/14/19 0600 08/15/19 0500 08/16/19 0318  Weight: 103.7 kg 103.6 kg 103.4 kg    Telemetry    Sinus with frequent ectopy - Personally Reviewed  Physical Exam   GEN- The patient is well appearing, alert and oriented x 3 today.   Head- normocephalic, atraumatic Eyes-  Sclera  clear, conjunctiva pink Ears- hearing intact Oropharynx- clear Neck- supple, Lungs-  normal work of breathing Heart- Regular rate and rhythm  GI- soft, NT, ND, + BS Extremities- no clubbing, cyanosis, + dependant edema  MS- no significant deformity or atrophy Skin- no rash or lesion Psych- euthymic mood, full affect Neuro- strength and sensation are intact   Labs    Chemistry Recent Labs  Lab 08/14/19 1748 08/15/19 0448 08/16/19 0314  NA 133* 134* 136  K 3.6 3.9 3.8  CL 103 105 102  CO2 19* 20* 24  GLUCOSE 150* 164* 159*  BUN '12 11 15  '$ CREATININE 0.90 0.67 0.92  CALCIUM 8.3* 8.2* 8.3*  GFRNONAA >60 >60 >60  GFRAA >60 >60 >60  ANIONGAP '11 9 10     '$ Hematology Recent Labs  Lab 08/14/19 1748 08/15/19 0448 08/16/19 0314  WBC 17.3* 15.0* 15.2*  RBC 4.27 4.18* 4.23  HGB 12.7* 12.5* 12.7*  HCT 37.8* 37.0* 36.5*  MCV 88.5 88.5 86.3  MCH 29.7 29.9 30.0  MCHC 33.6 33.8 34.8  RDW 12.9 12.9 12.6  PLT 212 204 211  Patient Profile   63 y.o.malewith hx of HLDadmitted with NSTEMI and found to have 2 V CAD. Underwent three-vessel CABG on 08/13/2019   Assessment & Plan    1.  Ischemic CM/ acute on chronic systolic dysfunction Continue medical management Consider entresto prior to discharge Remains volume overloaded, would keep slightly negative.  Hopefully will continue to improve as he becomes more ambulatory  2. NSTEMI/ CAD S/p CABG this admit On high intensity statin  3. HTN Stable No change required today  4. New dx of DM SSI while here. Consider SPGL2 inhibitor on discharge  Thompson Grayer MD, [facility] 08/16/2019 1:25 PM

## 2019-08-16 NOTE — Progress Notes (Signed)
Pt ambulated x 470 feet around the unit will continue to monitor

## 2019-08-16 NOTE — Progress Notes (Signed)
Pt ambulated around unit x 470 feet independently will continue to monitor  

## 2019-08-16 NOTE — Progress Notes (Signed)
3 Days Post-Op Procedure(s) (LRB): OFF PUMP CORONARY ARTERY BYPASS GRAFTING (CABG) x 2 WITH ENDOSCOPIC HARVESTING OF RIGHT GREATER SAPHENOUS VEIN (N/A) TRANSESOPHAGEAL ECHOCARDIOGRAM (TEE) (N/A) Subjective: No complaints  Objective: Vital signs in last 24 hours: Temp:  [97.6 F (36.4 C)-98.4 F (36.9 C)] 98.4 F (36.9 C) (08/30 0818) Pulse Rate:  [83-105] 87 (08/30 0818) Cardiac Rhythm: Normal sinus rhythm (08/30 0700) Resp:  [19-26] 21 (08/30 0818) BP: (108-142)/(62-98) 131/65 (08/30 0818) SpO2:  [92 %-96 %] 96 % (08/30 0818) Weight:  [103.4 kg] 103.4 kg (08/30 0318)  Hemodynamic parameters for last 24 hours:    Intake/Output from previous day: 08/29 0701 - 08/30 0700 In: 960 [P.O.:960] Out: 400 [Urine:400] Intake/Output this shift: No intake/output data recorded.  General appearance: alert, cooperative and no distress Neurologic: intact Heart: regular rate and rhythm Lungs: diminished breath sounds bibasilar Wound: clean and dry  Lab Results: Recent Labs    08/15/19 0448 08/16/19 0314  WBC 15.0* 15.2*  HGB 12.5* 12.7*  HCT 37.0* 36.5*  PLT 204 211   BMET:  Recent Labs    08/15/19 0448 08/16/19 0314  NA 134* 136  K 3.9 3.8  CL 105 102  CO2 20* 24  GLUCOSE 164* 159*  BUN 11 15  CREATININE 0.67 0.92  CALCIUM 8.2* 8.3*    PT/INR:  Recent Labs    08/13/19 1343  LABPROT 16.1*  INR 1.3*   ABG    Component Value Date/Time   PHART 7.361 08/13/2019 1848   HCO3 19.4 (L) 08/13/2019 1848   TCO2 19 (L) 08/13/2019 1959   ACIDBASEDEF 5.0 (H) 08/13/2019 1848   O2SAT 94.0 08/13/2019 1848   CBG (last 3)  Recent Labs    08/15/19 1556 08/15/19 2132 08/16/19 0610  GLUCAP 188* 201* 145*    Assessment/Plan: S/P Procedure(s) (LRB): OFF PUMP CORONARY ARTERY BYPASS GRAFTING (CABG) x 2 WITH ENDOSCOPIC HARVESTING OF RIGHT GREATER SAPHENOUS VEIN (N/A) TRANSESOPHAGEAL ECHOCARDIOGRAM (TEE) (N/A) -Overall doing well Continue cardiac rehab CBG elevated- will  increase metformin Possibly home in AM   LOS: 6 days    Melrose Nakayama 08/16/2019

## 2019-08-17 LAB — BASIC METABOLIC PANEL
Anion gap: 8 (ref 5–15)
BUN: 19 mg/dL (ref 8–23)
CO2: 23 mmol/L (ref 22–32)
Calcium: 8.1 mg/dL — ABNORMAL LOW (ref 8.9–10.3)
Chloride: 105 mmol/L (ref 98–111)
Creatinine, Ser: 0.88 mg/dL (ref 0.61–1.24)
GFR calc Af Amer: >60 mL/min (ref >60)
GFR calc non Af Amer: >60 mL/min (ref >60)
Glucose, Bld: 147 mg/dL — ABNORMAL HIGH (ref 70–99)
Potassium: 3.5 mmol/L (ref 3.5–5.1)
Sodium: 136 mmol/L (ref 135–145)

## 2019-08-17 LAB — GLUCOSE, CAPILLARY: Glucose-Capillary: 145 mg/dL — ABNORMAL HIGH (ref 70–99)

## 2019-08-17 MED ORDER — LOSARTAN POTASSIUM 25 MG PO TABS: 25.0000 mg | ORAL_TABLET | Freq: Every day | ORAL | 1 refills | Status: DC

## 2019-08-17 MED ORDER — POTASSIUM CHLORIDE CRYS ER 20 MEQ PO TBCR
40.0000 meq | EXTENDED_RELEASE_TABLET | Freq: Once | ORAL | Status: AC
  Administered 2019-08-17: 40 meq via ORAL
  Filled 2019-08-17: qty 2

## 2019-08-17 MED ORDER — METFORMIN HCL 850 MG PO TABS: 850.0000 mg | ORAL_TABLET | Freq: Two times a day (BID) | ORAL | 1 refills | Status: DC

## 2019-08-17 MED ORDER — POTASSIUM CHLORIDE CRYS ER 20 MEQ PO TBCR: 20.0000 meq | EXTENDED_RELEASE_TABLET | Freq: Every day | ORAL | 0 refills | Status: DC

## 2019-08-17 MED ORDER — ATORVASTATIN CALCIUM 80 MG PO TABS: 80.0000 mg | ORAL_TABLET | Freq: Every day | ORAL | 1 refills | Status: DC

## 2019-08-17 MED ORDER — POTASSIUM CHLORIDE CRYS ER 20 MEQ PO TBCR: 20.0000 meq | EXTENDED_RELEASE_TABLET | Freq: Every day | ORAL | Status: DC

## 2019-08-17 MED ORDER — CARVEDILOL 6.25 MG PO TABS: 6.2500 mg | ORAL_TABLET | Freq: Two times a day (BID) | ORAL | 1 refills | Status: DC

## 2019-08-17 MED ORDER — POTASSIUM CHLORIDE CRYS ER 20 MEQ PO TBCR
20.0000 meq | EXTENDED_RELEASE_TABLET | Freq: Every day | ORAL | Status: DC
  Administered 2019-08-17: 20 meq via ORAL
  Filled 2019-08-17: qty 1

## 2019-08-17 MED ORDER — OXYCODONE HCL 5 MG PO TABS: 5.0000 mg | ORAL_TABLET | ORAL | 0 refills | Status: DC | PRN

## 2019-08-17 MED ORDER — ASPIRIN 325 MG PO TBEC: 325.0000 mg | DELAYED_RELEASE_TABLET | Freq: Every day | ORAL | 0 refills | Status: DC

## 2019-08-17 MED ORDER — BLOOD GLUCOSE MONITOR KIT: PACK | 0 refills | Status: DC

## 2019-08-17 MED ORDER — FUROSEMIDE 40 MG PO TABS
40.0000 mg | ORAL_TABLET | Freq: Every day | ORAL | Status: DC
  Administered 2019-08-17: 40 mg via ORAL
  Filled 2019-08-17: qty 1

## 2019-08-17 MED ORDER — GLIPIZIDE 5 MG PO TABS: 5.0000 mg | ORAL_TABLET | Freq: Two times a day (BID) | ORAL | 11 refills | Status: DC

## 2019-08-17 MED ORDER — FUROSEMIDE 40 MG PO TABS: 40.0000 mg | ORAL_TABLET | Freq: Every day | ORAL | 0 refills | Status: DC

## 2019-08-17 MED FILL — oxyCODONE HCL 5 MG TABS: 5 | 5 days supply | Qty: 30 | Fill #0

## 2019-08-17 MED FILL — metFORMIN HCL 850 MG TABS: 850 | 30 days supply | Qty: 60 | Fill #0

## 2019-08-17 MED FILL — ATORVASTATIN CALCIUM 80 MG: 80 | 30 days supply | Qty: 30 | Fill #0

## 2019-08-17 MED FILL — glipiZIDE 5 MG TABS: 5 | 30 days supply | Qty: 60 | Fill #0

## 2019-08-17 MED FILL — CARVEDILOL 6.25 MG TABLET: 6.25 | 30 days supply | Qty: 60 | Fill #0

## 2019-08-17 MED FILL — POTASSIUM CL ER 20 MEQ TABL: 20 | 7 days supply | Qty: 7 | Fill #0

## 2019-08-17 MED FILL — LOSARTAN POTASSIUM 25 MG TA: 25 | 30 days supply | Qty: 30 | Fill #0

## 2019-08-17 MED FILL — FUROSEMIDE 40 MG TABLET: 40 | 7 days supply | Qty: 7 | Fill #0

## 2019-08-17 NOTE — Care Management Important Message (Signed)
Important Message  Patient Details  Name: Anthony Alvarez MRN: [id number] Date of Birth: 1956-09-19   Medicare Important Message Given:  Yes     Shelda Altes 08/17/2019, 1:39 PM

## 2019-08-17 NOTE — Progress Notes (Signed)
Progress Note  Patient Name: Anthony Alvarez Date of Encounter: 08/17/2019  Primary Cardiologist: Glenetta Hew, MD   Subjective   Postop day #4 CABG x3 by Dr. Kipp Brood in the setting of non-STEMI.  Patient apparently ready for discharge home today.  He feels well, ambulating without symptoms, denies chest pain or shortness of breath.  Inpatient Medications    Scheduled Meds: . acetaminophen  1,000 mg Oral Q6H   Or  . acetaminophen (TYLENOL) oral liquid 160 mg/5 mL  1,000 mg Per Tube Q6H  . aspirin EC  325 mg Oral Daily   Or  . aspirin  324 mg Per Tube Daily  . atorvastatin  80 mg Oral q1800  . bisacodyl  10 mg Oral Daily   Or  . bisacodyl  10 mg Rectal Daily  . carvedilol  6.25 mg Oral BID WC  . chlorhexidine gluconate (MEDLINE KIT)  15 mL Mouth Rinse BID  . docusate sodium  200 mg Oral Daily  . furosemide  40 mg Oral Daily  . insulin aspart  0-15 Units Subcutaneous TID WC  . insulin aspart  0-5 Units Subcutaneous QHS  . losartan  25 mg Oral Daily  . metFORMIN  850 mg Oral BID WC  . mupirocin ointment  1 application Nasal BID  . pantoprazole  40 mg Oral Daily  . potassium chloride  20 mEq Oral Daily  . sodium chloride flush  3 mL Intravenous Q12H   Continuous Infusions: . sodium chloride     PRN Meds: sodium chloride, alum & mag hydroxide-simeth, magnesium hydroxide, metoprolol tartrate, ondansetron (ZOFRAN) IV, oxyCODONE, sodium chloride flush, traMADol, zolpidem   Vital Signs    Vitals:   08/16/19 2255 08/17/19 0425 08/17/19 0428 08/17/19 0813  BP: 136/70 126/72 126/72 133/66  Pulse: 88 (!) 111 82 80  Resp: '15 20 20 '$ (!) 22  Temp: 99.2 F (37.3 C) 98.2 F (36.8 C)  98.2 F (36.8 C)  TempSrc: Oral Oral  Oral  SpO2: 97% 95% 94% 96%  Weight:   103.1 kg   Height:        Intake/Output Summary (Last 24 hours) at 08/17/2019 1023 Last data filed at 08/16/2019 1845 Gross per 24 hour  Intake 200 ml  Output -  Net 200 ml   Last 3 Weights 08/17/2019 08/16/2019  08/15/2019  Weight (lbs) 227 lb 4.7 oz 227 lb 15.3 oz 228 lb 6.3 oz  Weight (kg) 103.1 kg 103.4 kg 103.6 kg      Telemetry    Sinus rhythm with occasional PACs and PVCs- Personally Reviewed  ECG    Not performed today- Personally Reviewed  Physical Exam   GEN: No acute distress.   Neck: No JVD Cardiac: RRR, no murmurs, rubs, or gallops.  Respiratory: Clear to auscultation bilaterally. GI: Soft, nontender, non-distended  MS: No edema; No deformity. Neuro:  Nonfocal  Psych: Normal affect   Labs    High Sensitivity Troponin:   Recent Labs  Lab 08/10/19 1550 08/10/19 1750 08/10/19 2221 08/11/19 0016  TROPONINIHS 1,068* 1,161* 1,357* 1,365*      Chemistry Recent Labs  Lab 08/15/19 0448 08/16/19 0314 08/17/19 0351  NA 134* 136 136  K 3.9 3.8 3.5  CL 105 102 105  CO2 20* 24 23  GLUCOSE 164* 159* 147*  BUN '11 15 19  '$ CREATININE 0.67 0.92 0.88  CALCIUM 8.2* 8.3* 8.1*  GFRNONAA >60 >60 >60  GFRAA >60 >60 >60  ANIONGAP 9 10 8  Hematology Recent Labs  Lab 08/14/19 1748 08/15/19 0448 08/16/19 0314  WBC 17.3* 15.0* 15.2*  RBC 4.27 4.18* 4.23  HGB 12.7* 12.5* 12.7*  HCT 37.8* 37.0* 36.5*  MCV 88.5 88.5 86.3  MCH 29.7 29.9 30.0  MCHC 33.6 33.8 34.8  RDW 12.9 12.9 12.6  PLT 212 204 211    BNPNo results for input(s): BNP, PROBNP in the last 168 hours.   DDimer No results for input(s): DDIMER in the last 168 hours.   Radiology    Dg Chest 2 View  Result Date: 08/16/2019 CLINICAL DATA:  Atelectasis. EXAM: CHEST - 2 VIEW COMPARISON:  Chest x-ray from yesterday. FINDINGS: Interval removal of the right internal jugular sheath. Stable mild cardiomegaly status post CABG. Normal pulmonary vascularity. Persistent bibasilar atelectasis, slightly improved on the right. Unchanged trace left pleural effusion. No right pleural effusion. No pneumothorax. No acute osseous abnormality. IMPRESSION: 1. Persistent bibasilar atelectasis, slightly improved on the right. 2.  Unchanged trace left pleural effusion. Electronically Signed   By: Titus Dubin M.D.   On: 08/16/2019 09:11    Cardiac Studies   2D echocardiogram (08/11/2019)  IMPRESSIONS    1. The left ventricle has a visually estimated ejection fraction of 35%. The cavity size was normal. There is mildly increased left ventricular wall thickness. Left ventricular diastolic Doppler parameters are consistent with pseudonormalization. Mid to  apical septal akinesis, akinesis of the true apex, mid to apical inferior akinesis, akinesis of the apical lateral wall.  2. The right ventricle has normal systolic function. The cavity was normal. There is no increase in right ventricular wall thickness.  3. No evidence of mitral valve stenosis. Trivial mitral regurgitation.  4. The aortic valve is tricuspid. Aortic valve regurgitation is trivial by color flow Doppler. No stenosis of the aortic valve.  5. There is mild dilatation of the aortic root measuring 42 mm.  6. The IVC was not visualized. No complete TR doppler jet so unable to estimate PA systolic pressure.  Cardiac catheterization (08/11/2019)  Conclusion    RV Branch lesion is 95% stenosed.  RPAV lesion is 95% stenosed.  Mid RCA lesion is 95% stenosed.  Prox LAD to Mid LAD lesion is 100% stenosed.  There is moderate left ventricular systolic dysfunction.  LV end diastolic pressure is moderately elevated.  The left ventricular ejection fraction is 35-45% by visual estimate.   1. Critical 2 vessel obstructive CAD    - CTO of the proximal LAD. LAD fills late by left to left collaterals    - 95% thrombotic lesion in the mid RCA with distal embolization into the PL branch 2. Moderate LV dysfunction. EF 40% with severe inferior wall HK 3. Moderately elevated LVEDP 24 mm Hg.    Intervention     Patient Profile     63 y.o. married Caucasian male admitted on 08/10/2019 with chest pain and non-STEMI.  Apparently he had an index event a  week before historically.  He has essential hypertension and hyperlipidemia.  Underwent cardiac catheterization by Dr. [name] revealing two-vessel disease and ultimately underwent CABG x3 by Dr. Kipp Brood the LIMA to the LAD and vein to the PDA and PLA.  His EF is in the 35% range.  He has had normal progression and is now anticipated to be discharged.  Assessment & Plan    1: Non-STEMI- catheterization performed Dr. [name] revealed a LAD CTO with high-grade RCA disease, right to left collaterals now postop day 4 CABG x3.  2: Ischemic cardiomyopathy-  on appropriate medications including beta-blocker, ARB and diuretics.  Can probably change to Spironolactone as an outpatient.  Will need 2D echo in 3 months to assess improvement LV function.  3: Hyperlipidemia-on statin therapy  4: Essential hypertension- blood pressure controlled on current medications  5: Non-insulin-requiring diabetes.  New diagnosis is admission on SSI.  May benefit from SGLT2 medications  Discharge per T CTS.  We will arrange outpatient follow-up with Dr. Ellyn Hack as well.  For questions or updates, please contact [place] Please consult [email or web address] for contact info under        Signed, Quay Burow, MD  08/17/2019, 10:23 AM

## 2019-08-17 NOTE — Progress Notes (Signed)
Pt ambulated around unit independently x 470 feet

## 2019-08-17 NOTE — Progress Notes (Signed)
CARDIAC REHAB PHASE I   Pt ambulatating independently without any difficulty. D/c education completed with pt and wife. Pt educated on importance of showers and monitoring incisions daily. Encouraged continued IS use, walks, and sternal precautions. Pt given in-the-tube sheet along with heart healthy and diabetic diets. Reviewed restrictions and exercise guidelines. Pt and wife deny any further questions. Will refer to CRP II [place]. Pt interested in Virtual Cardiac Rehab.  Pt is interested in participating in Virtual Cardiac and Pulmonary Rehab. Pt advised that Virtual Cardiac and Pulmonary Rehab is provided at no cost to the patient.  Checklist:  1. Pt has smart device  ie smartphone and/or ipad for downloading an app  Yes 2. Reliable internet/wifi service    Yes 3. Understands how to use their smartphone and navigate within an app.  Yes  Pt verbalized understanding and is in agreement.   [id number] Rufina Falco, RN BSN 08/17/2019 10:48 AM

## 2019-08-17 NOTE — Progress Notes (Signed)
Discharge AVS meds take and those due reviewed with pt. Follow up appointments and when to call MD reviewed. All questions and concerns addressed. No further questions at this time. D/c IV and TELE, CCMD notified. D/C home per orders. Pt awaiting meds from [facility] pharm.

## 2019-08-17 NOTE — Progress Notes (Addendum)
      [address]       [place] [address]             [phone]        4 Days Post-Op Procedure(s) (LRB): OFF PUMP CORONARY ARTERY BYPASS GRAFTING (CABG) x 2 WITH ENDOSCOPIC HARVESTING OF RIGHT GREATER SAPHENOUS VEIN (N/A) TRANSESOPHAGEAL ECHOCARDIOGRAM (TEE) (N/A)  Subjective: Patient sitting in chair this am. He has no complaints and wants to go home.  Objective: Vital signs in last 24 hours: Temp:  [97.8 F (36.6 C)-99.3 F (37.4 C)] 98.2 F (36.8 C) (08/31 0425) Pulse Rate:  [81-111] 82 (08/31 0428) Cardiac Rhythm: Normal sinus rhythm (08/31 0425) Resp:  [15-25] 20 (08/31 0428) BP: (117-136)/(62-72) 126/72 (08/31 0428) SpO2:  [94 %-99 %] 94 % (08/31 0428) Weight:  [103.1 kg] 103.1 kg (08/31 0428)  Pre op weight 100.2 kg Current Weight  08/17/19 103.1 kg      Intake/Output from previous day: 08/30 0701 - 08/31 0700 In: 200 [P.O.:200] Out: -    Physical Exam:  Cardiovascular: RRR Pulmonary: Slightly diminished bibasilar breath sounds Abdomen: Soft, non tender, bowel sounds present. Extremities: Mild bilateral lower extremity edema. Wound: Clean and dry.  No erythema or signs of infection.  Lab Results: CBC: Recent Labs    08/15/19 0448 08/16/19 0314  WBC 15.0* 15.2*  HGB 12.5* 12.7*  HCT 37.0* 36.5*  PLT 204 211   BMET:  Recent Labs    08/16/19 0314 08/17/19 0351  NA 136 136  K 3.8 3.5  CL 102 105  CO2 24 23  GLUCOSE 159* 147*  BUN 15 19  CREATININE 0.92 0.88  CALCIUM 8.3* 8.1*    PT/INR:  Lab Results  Component Value Date   INR 1.3 (H) 08/13/2019   ABG:  INR: Will add last result for INR, ABG once components are confirmed Will add last 4 CBG results once components are confirmed  Assessment/Plan:  1. CV - SR in the 80's this am. On Coreg 6.25 mg bid and Losartan 25 mg daily.  2.  Pulmonary - On room air. Encourage incentive spirometer 3. Volume Overload - Will give Lasix this am 4.  Acute blood loss  anemia - Last H and H 12.7 and 36.5 5. DM-CBGs 171/143/145. On Metformin 850 mg bid. Pre op HGA1C 8.1. Will need closed medical follow up after discharge. 6. Supplement potassium 7. Will discharge later today, after seen by cardiology  Lucah Petta M ZimmermanPA-C 08/17/2019,7:46 AM [phone]

## 2019-08-18 ENCOUNTER — Telehealth (HOSPITAL_COMMUNITY): Payer: Self-pay

## 2019-08-18 ENCOUNTER — Other Ambulatory Visit: Payer: Self-pay | Admitting: *Deleted

## 2019-08-18 DIAGNOSIS — E119 Type 2 diabetes mellitus without complications: Secondary | ICD-10-CM

## 2019-08-18 MED ORDER — ONETOUCH ULTRASOFT LANCETS MISC: 1 refills | Status: DC

## 2019-08-18 MED ORDER — GLUCOSE BLOOD VI STRP: ORAL_STRIP | 1 refills | Status: AC

## 2019-08-18 MED ORDER — BLOOD GLUCOSE MONITOR KIT: PACK | 0 refills | Status: DC

## 2019-08-18 MED ORDER — ONETOUCH ULTRASOFT LANCETS MISC: 1 refills | Status: AC

## 2019-08-18 MED FILL — Sodium Chloride IV Soln 0.9%: INTRAVENOUS | Qty: 3000 | Status: AC

## 2019-08-18 NOTE — Telephone Encounter (Signed)
Attempted to contact pt in regards to CR, LMTCB. °

## 2019-08-18 NOTE — Telephone Encounter (Signed)
Pt insurance is active and benefits verified through Medicare A/B. Co-pay $0.00, DED $198.00/$73.29 met, out of pocket $0.00/$0.00 met, co-insurance 20%. No pre-authorization required. Passport, 08/18/2019 @ 9:29AM, ZTA#[id number]  Will contact patient to see if he is interested in the Cardiac Rehab Program. If interested, patient will need to complete follow up appt. Once completed, patient will be contacted for scheduling upon review by the RN Navigator.

## 2019-08-21 ENCOUNTER — Other Ambulatory Visit: Payer: Self-pay | Admitting: Thoracic Surgery (Cardiothoracic Vascular Surgery)

## 2019-08-21 ENCOUNTER — Ambulatory Visit
Admission: RE | Admit: 2019-08-21 | Discharge: 2019-08-21 | Disposition: A | Discharge status: Home / Self Care | Payer: Medicare Other | Source: Ambulatory Visit | Attending: Thoracic Surgery (Cardiothoracic Vascular Surgery) | Admitting: Thoracic Surgery (Cardiothoracic Vascular Surgery)

## 2019-08-21 ENCOUNTER — Ambulatory Visit (INDEPENDENT_AMBULATORY_CARE_PROVIDER_SITE_OTHER): Payer: Self-pay | Admitting: Thoracic Surgery (Cardiothoracic Vascular Surgery)

## 2019-08-21 ENCOUNTER — Encounter: Payer: Self-pay | Admitting: Thoracic Surgery (Cardiothoracic Vascular Surgery)

## 2019-08-21 ENCOUNTER — Other Ambulatory Visit: Payer: Self-pay

## 2019-08-21 VITALS — BP 110/74 | HR 84 | Temp 96.8°F | Resp 16 | Ht 71.0 in | Wt 218.7 lb

## 2019-08-21 DIAGNOSIS — I2510 Atherosclerotic heart disease of native coronary artery without angina pectoris: Secondary | ICD-10-CM

## 2019-08-21 DIAGNOSIS — Z951 Presence of aortocoronary bypass graft: Secondary | ICD-10-CM

## 2019-08-21 MED ORDER — PANTOPRAZOLE SODIUM 40 MG PO TBEC: 40.0000 mg | DELAYED_RELEASE_TABLET | Freq: Every day | ORAL | 0 refills | Status: DC

## 2019-08-21 MED ORDER — OXYCODONE HCL 5 MG PO TABS: 5.0000 mg | ORAL_TABLET | ORAL | 0 refills | Status: AC | PRN

## 2019-08-21 NOTE — Progress Notes (Signed)
      [address]       [place] [address]             [phone]        Seith [place] Medical Record #[id number] Date of Birth: Sep 03, 1956  Referring: [name], MD Primary Care: Lawerance Cruel, MD Primary Cardiologist:David Ellyn Hack, MD  Reason for visit:   follow-up  History of Present Illness:     63 year old male presents for his first follow-up appointment after undergoing three-vessel CABG.  The most part he is done well, he has had one episode junction was checked today.  He describes nausea pain prior to this episode.  A few days ago he was one episode of diarrhea.  Ambulating in hall has been tolerating a diet but has had subsequent sepsis.  Physical Exam: BP 110/74 (BP Location: Right Arm, Patient Position: Sitting, Cuff Size: Normal)   Pulse 84   Temp (!) 96.8 F (36 C)   Resp 16   Ht 5\' 11"  (1.803 m)   Wt 218 lb 11.2 oz (99.2 kg)   SpO2 95% Comment: RA  BMI 30.50 kg/m   Alert NAD RRR, no murmur.  Incision clean.  Sternum stable Abdomen soft, NT/ND No peripheral edema   Diagnostic Studies & Laboratory data: CXR: Trace bilateral effusions.     Assessment / Plan:   Post three-vessel CABG.  Doing well Prescription for Protonix and refill his pain medication given today. Follow-up with his primary care physician next week. Return to clinic in 1 month   [name] 08/21/2019 3:12 PM

## 2019-08-27 NOTE — Progress Notes (Signed)
Cardiology Office Note   Date:  08/31/2019   ID:  Anthony Alvarez, DOB [birth year], MRN [id number]  PCP:  Lawerance Cruel, MD  Cardiologist: Dr. Beckie Salts Follow Up s/P CABG    History of Present Illness: Anthony Alvarez is a 63 y.o. male who presents for post hospital follow up after undergoing 3 vessel CABG in the setting of multivessel CAD found on cardiac cath on 08/11/2019.  Cath revealed critical lesions of the RV branch 95%, RPAV lesion 95%, Mid RCA 95% stenosis, and proximal LAD to mid LAD 100%  LVEF of 35%- 45%  Anthony Alvarez had CABG with LIMA to LAD, RSVG to PDA, and RSVG to PLVB with endoscopic harvest of greater saphenous vein from right leg, by Dr. Kipp Brood on 08/13/2019. Post operatively, he was treated with IV lasix due to fluid overload, started on Coreg, Losartan, high dose atorvastatin, and continued on oral diabetic managetn.   Anthony Alvarez has no complaints today.  He is adhering to heart healthy diet, he is walking every day, he does have some mild soreness in his chest postoperatively which is not keeping him from moving forward in his low-level exercise regimen.  He is medically compliant.  Denies any fluid retention or dyspnea on exertion.  Past Medical History:  Diagnosis Date  . HLD (hyperlipidemia)     Past Surgical History:  Procedure Laterality Date  . CORONARY ARTERY BYPASS GRAFT N/A 08/13/2019   Procedure: OFF PUMP CORONARY ARTERY BYPASS GRAFTING (CABG) x 2 WITH ENDOSCOPIC HARVESTING OF RIGHT GREATER SAPHENOUS VEIN;  Surgeon: [name], MD;  Location: [facility];  Service: Open Heart Surgery;  Laterality: N/A;  . LEFT HEART CATH AND CORONARY ANGIOGRAPHY N/A 08/11/2019   Procedure: LEFT HEART CATH AND CORONARY ANGIOGRAPHY;  Surgeon: [name], [name], MD;  Location: [facility];  Service: Cardiovascular;  Laterality: N/A;  . TEE WITHOUT CARDIOVERSION N/A 08/13/2019   Procedure: TRANSESOPHAGEAL ECHOCARDIOGRAM (TEE);  Surgeon: [name],  MD;  Location: [facility];  Service: Open Heart Surgery;  Laterality: N/A;     Current Outpatient Medications  Medication Sig Dispense Refill  . acetaminophen (TYLENOL) 500 MG tablet Take 500-1,000 mg by mouth every 8 (eight) hours as needed (for pain).     [name] aspirin EC 325 MG EC tablet Take 1 tablet (325 mg total) by mouth daily. 30 tablet 0  . atorvastatin (LIPITOR) 80 MG tablet Take 1 tablet (80 mg total) by mouth daily at 6 PM. 30 tablet 1  . blood glucose meter kit and supplies KIT Dispense based on patient and insurance preference. Use up to four times daily as directed. (ICD-10 E11.9) ONE TOUCH ULTRA # 2 KIT 1 each 0  . carvedilol (COREG) 6.25 MG tablet Take 1 tablet (6.25 mg total) by mouth 2 (two) times daily with a meal. 180 tablet 3  . dapagliflozin propanediol (FARXIGA) 5 MG TABS tablet Take 5 mg by mouth daily.    [name] glipiZIDE (GLUCOTROL) 5 MG tablet Take 5 mg by mouth as needed.    [name] glucose blood test strip TEST FOUR TIMES DAILY OR AS DIRECTED 100 each 1  . Lancets (ONETOUCH ULTRASOFT) lancets Use as instructed 100 each 1  . losartan (COZAAR) 25 MG tablet Take 1 tablet (25 mg total) by mouth daily. 90 tablet 3  . metFORMIN (GLUCOPHAGE) 850 MG tablet Take 1 tablet (850 mg total) by mouth 2 (two) times daily with a meal. 60 tablet 1  . pantoprazole (PROTONIX) 40  MG tablet Take 1 tablet (40 mg total) by mouth daily. 30 tablet 0   No current facility-administered medications for this visit.     Allergies:   Patient has no allergy information on record.    Social History:  The patient  reports that he has quit smoking. His smoking use included cigarettes. He has never used smokeless tobacco.   Family History:  The patient's family history is not on file.    ROS: All other systems are reviewed and negative. Unless otherwise mentioned in H&P    PHYSICAL EXAM: VS:  BP 92/68   Pulse 79   Ht _0  (1.803 m)   Wt 216 lb 12.8 oz (98.3 kg)   BMI 30.24 kg/m  , BMI Body mass index  is 30.24 kg/m. GEN: Well nourished, well developed, in no acute distress HEENT: normal Neck: no JVD, carotid bruits, or masses Cardiac: RRR distant heart sounds; no murmurs, rubs, or gallops,no edema  Respiratory:  Clear to auscultation bilaterally, normal work of breathing GI: soft, nontender, nondistended, + BS MS: no deformity or atrophy. Well healed sternotomy. No evidence of infection. Right vein harvest site is well healed.  Skin: warm and dry, no rash Neuro:  Strength and sensation are intact Psych: euthymic mood, full affect   EKG: Normal sinus rhythm, evidence of inferior anterior infarct age undetermined, possible left atrial enlargement.  Heart rate of 79 bpm.  Recent Labs: 08/14/2019: Magnesium 2.2 08/16/2019: Hemoglobin 12.7; Platelets 211 08/17/2019: BUN 19; Creatinine, Ser 0.88; Potassium 3.5; Sodium 136    Lipid Panel    Component Value Date/Time   CHOL 124 08/11/2019 0421   TRIG 246 (H) 08/11/2019 0421   HDL 21 (L) 08/11/2019 0421   CHOLHDL 5.9 08/11/2019 0421   VLDL 49 (H) 08/11/2019 0421   LDLCALC 54 08/11/2019 0421      Wt Readings from Last 3 Encounters:  08/31/19 216 lb 12.8 oz (98.3 kg)  08/21/19 218 lb 11.2 oz (99.2 kg)  08/17/19 227 lb 4.7 oz (103.1 kg)      Other studies Reviewed: Left Heart Cath 08/11/2019  RV Branch lesion is 95% stenosed.  RPAV lesion is 95% stenosed.  Mid RCA lesion is 95% stenosed.  Prox LAD to Mid LAD lesion is 100% stenosed.  There is moderate left ventricular systolic dysfunction.  LV end diastolic pressure is moderately elevated.  The left ventricular ejection fraction is 35-45% by visual estimate.   1. Critical 2 vessel obstructive CAD    - CTO of the proximal LAD. LAD fills late by left to left collaterals    - 95% thrombotic lesion in the mid RCA with distal embolization into the PL branch 2. Moderate LV dysfunction. EF 40% with severe inferior wall HK 3. Moderately elevated LVEDP 24 mm Hg.   Echocardiogram 08/11/2019 1. The left ventricle has a visually estimated ejection fraction of 35%. The cavity size was normal. There is mildly increased left ventricular wall thickness. Left ventricular diastolic Doppler parameters are consistent with pseudonormalization. Mid to  apical septal akinesis, akinesis of the true apex, mid to apical inferior akinesis, akinesis of the apical lateral wall.  2. The right ventricle has normal systolic function. The cavity was normal. There is no increase in right ventricular wall thickness.  3. No evidence of mitral valve stenosis. Trivial mitral regurgitation.  4. The aortic valve is tricuspid. Aortic valve regurgitation is trivial by color flow Doppler. No stenosis of the aortic valve.  5. There is mild dilatation  of the aortic root measuring 42 mm.  6. The IVC was not visualized. No complete TR doppler jet so unable to estimate PA systolic pressure.  ASSESSMENT AND PLAN:  1. CAD: Status post CABG.  Although still sore he is healing well, he continues to move forward.  He has been seen by surgeon and is not yet been released to return to work or drive yet.  Recommend cardiac rehab for continued evaluation and help with returning to physical exercise.  He continues on carvedilol 6.25 mg twice daily and losartan 25 mg daily.  Multiple questions are answered. He is given low carbohydrate, heat healthy diet information.  2.  Hypotension: I have rechecked the blood pressure in the office and found to be 112/68.  The patient is reducing his caloric intake and also his carbohydrate intake.  The patient has lost approximately 10 to 12 pounds.  I am concerned that with continued weight loss he may have a lower blood pressure.  Would recommend decreasing losartan to 12.5 mg daily if blood pressure remains less then 699 systolic at home.  3.  Hypercholesterolemia: LDL should be less than 70, during recent hospitalization LDL was 54.  4.  Non-insulin-dependent  diabetes: The patient was taken off of glipizide by his PCP and started on Farxiga 5 mg.  The patient has not started taking this yet because it was very expensive.  He was given samples by PCP, we will provide him with samples if we have some along with drug assistance forms.  My concern with this medication is it may also cause his pressure to be a little lower with diuresis.  I have again asked him to watch his blood pressure closely at home.  If his blood pressure remains less than 967 systolic, we will decrease losartan to 12.5, and possibly decrease carvedilol as well.  He will be seen again in 3 months unless symptomatic.   Current medicines are reviewed at length with the patient today.    Labs/ tests ordered today include:  Phill Myron. West Pugh, ANP, [facility]   08/31/2019 11:49 AM    [address] Office [phone] Fax [phone]

## 2019-08-28 ENCOUNTER — Ambulatory Visit: Payer: Medicare Other | Admitting: Thoracic Surgery (Cardiothoracic Vascular Surgery)

## 2019-08-31 ENCOUNTER — Other Ambulatory Visit: Payer: Self-pay

## 2019-08-31 ENCOUNTER — Encounter: Payer: Self-pay | Admitting: Adult Health

## 2019-08-31 ENCOUNTER — Ambulatory Visit (INDEPENDENT_AMBULATORY_CARE_PROVIDER_SITE_OTHER): Payer: Medicare Other | Admitting: Adult Health

## 2019-08-31 VITALS — BP 92/68 | HR 79 | Ht 71.0 in | Wt 216.8 lb

## 2019-08-31 DIAGNOSIS — Z794 Long term (current) use of insulin: Secondary | ICD-10-CM

## 2019-08-31 DIAGNOSIS — I2510 Atherosclerotic heart disease of native coronary artery without angina pectoris: Secondary | ICD-10-CM

## 2019-08-31 DIAGNOSIS — Z951 Presence of aortocoronary bypass graft: Secondary | ICD-10-CM

## 2019-08-31 DIAGNOSIS — E119 Type 2 diabetes mellitus without complications: Secondary | ICD-10-CM | POA: Diagnosis not present

## 2019-08-31 DIAGNOSIS — E7800 Pure hypercholesterolemia, unspecified: Secondary | ICD-10-CM

## 2019-08-31 DIAGNOSIS — IMO0001 Reserved for inherently not codable concepts without codable children: Secondary | ICD-10-CM

## 2019-08-31 MED ORDER — LOSARTAN POTASSIUM 25 MG PO TABS: 25.0000 mg | ORAL_TABLET | Freq: Every day | ORAL | 3 refills | Status: DC

## 2019-08-31 MED ORDER — CARVEDILOL 6.25 MG PO TABS: 6.2500 mg | ORAL_TABLET | Freq: Two times a day (BID) | ORAL | 3 refills | Status: DC

## 2019-08-31 NOTE — Patient Instructions (Signed)
Medication Instructions:  Continue current medications  If you need a refill on your cardiac medications before your next appointment, please call your pharmacy.  Labwork: None Ordered   Testing/Procedures: None Ordered  Follow-Up: You will need a follow up appointment in 3 months.  Please call our office 2 months in advance to schedule this appointment.  You may see Glenetta Hew, MD or one of the following Advanced Practice Providers on your designated Care Team:   Rosaria Ferries, PA-C . Jory Sims, DNP, ANP     At [facility], you and your health needs are our priority.  As part of our continuing mission to provide you with exceptional heart care, we have created designated Provider Care Teams.  These Care Teams include your primary Cardiologist (physician) and Advanced Practice Providers (APPs -  Physician Assistants and Nurse Practitioners) who all work together to provide you with the care you need, when you need it.  Thank you for choosing [facility] at [facility]!!

## 2019-09-01 ENCOUNTER — Telehealth (HOSPITAL_COMMUNITY): Payer: Self-pay | Admitting: *Deleted

## 2019-09-01 NOTE — Telephone Encounter (Signed)
-----   Message from [name], MD sent at 08/31/2019  6:13 PM EDT ----- Regarding: RE: Ok to particpate in group exercise with High Covid Risk Score I definitely feel that he would be a good candidate.  He will benefit from exercise regimen but also from the diet and lifestyle modification education.  I am always a supporter of patients going to cardiac rehab  Glenetta Hew, MD   ----- Message ----- From: Rowe Pavy, RN Sent: 08/31/2019   2:23 PM EDT To: [name], MD Subject: Madaline Brilliant to particpate in group exercise with High#  Greetings Dr. Ellyn Hack,  We are seeing patients on-site for cardiac rehab . A great deal of planning with advisement from our Medical Director - Dr. Radford Pax, CV Service line leadership, Infection Disease Control, Facilities, security, recommendations from American Association of Cardiac and Pulmonary Rehab (AACVPR) with the goal for optimal patient safety. Patients will have strict guidelines and criteria they must adhere to and follow. Patients will wear a mask during exercise and practice social distancing. Patients will have to complete screening prior to entry into gym area.   Your patient expressed great interest in participating in facility cardiac rehab. Patient has a COVID-19 risk score of 7 Do you feel this patient is appropriate to resume exercise in facility cardiac rehab? Any additional restrictions you feel are appropriate for this patient?  Pt has completed both his follow up with Dr. Kipp Brood and Jory Sims PA on today.    Thank you and we appreciate your input Maurice Small RN, BSN Cardiac and Pulmonary Rehab Nurse Navigator    Cardiac Rehab Staff

## 2019-09-02 ENCOUNTER — Encounter (HOSPITAL_COMMUNITY): Payer: Self-pay

## 2019-09-02 ENCOUNTER — Other Ambulatory Visit (INDEPENDENT_AMBULATORY_CARE_PROVIDER_SITE_OTHER): Payer: Medicare Other

## 2019-09-02 DIAGNOSIS — I2510 Atherosclerotic heart disease of native coronary artery without angina pectoris: Secondary | ICD-10-CM

## 2019-09-16 LAB — ECHO INTRAOPERATIVE TEE
AV Mean grad: 3 mmHg — NL
Ao-asc: 3.3 cm — NL
Height: 71 in — NL
LVOT diameter: 2.1 mm — NL
MV Vena cont: 0.3 cm — NL
Mean grad: 1 mmHg — NL
STJ: 2.9 cm — NL
Sinus: 3.8 cm — NL
Weight: 3529.6 oz — NL

## 2019-09-18 ENCOUNTER — Encounter: Payer: Self-pay | Admitting: Thoracic Surgery (Cardiothoracic Vascular Surgery)

## 2019-09-18 ENCOUNTER — Other Ambulatory Visit: Payer: Self-pay

## 2019-09-18 ENCOUNTER — Ambulatory Visit (INDEPENDENT_AMBULATORY_CARE_PROVIDER_SITE_OTHER): Payer: Self-pay | Admitting: Thoracic Surgery (Cardiothoracic Vascular Surgery)

## 2019-09-18 VITALS — BP 136/85 | HR 88 | Temp 97.7°F | Resp 20 | Ht 71.0 in | Wt 217.0 lb

## 2019-09-18 DIAGNOSIS — Z951 Presence of aortocoronary bypass graft: Secondary | ICD-10-CM

## 2019-09-18 DIAGNOSIS — E119 Type 2 diabetes mellitus without complications: Secondary | ICD-10-CM

## 2019-09-18 DIAGNOSIS — I2510 Atherosclerotic heart disease of native coronary artery without angina pectoris: Secondary | ICD-10-CM

## 2019-09-18 NOTE — Progress Notes (Signed)
      [address]       [place] [address]             [phone]        Anthony Alvarez [place] Medical Record #[id number] Date of Birth: [birth year]  Referring: [name], MD Primary Care: Lawerance Cruel, MD Primary Cardiologist:David Ellyn Hack, MD  Reason for visit:   follow-up  History of Present Illness:     Mr. Cravens comes in for his 2nd appointment.  He has done well.  He has no complaints today  Physical Exam: BP 136/85   Pulse 88   Temp 97.7 F (36.5 C) (Skin)   Resp 20   Ht 5\' 11"  (1.803 m)   Wt 217 lb (98.4 kg)   SpO2 95% Comment: RA  BMI 30.27 kg/m   Alert NAD RRR, no murmur.  Incision clean.  Sternum stable Abdomen soft, NT/ND no peripheral edema   Diagnostic Studies & Laboratory data:     Assessment / Plan:   S/P CABG doing well  Clear for cardiac rehab RTC PRN   [name] 09/18/2019 12:30 PM

## 2019-11-30 ENCOUNTER — Encounter: Payer: Self-pay | Admitting: Cardiology

## 2019-11-30 ENCOUNTER — Other Ambulatory Visit: Payer: Self-pay

## 2019-11-30 ENCOUNTER — Ambulatory Visit: Payer: Medicare Other | Admitting: Cardiology

## 2019-11-30 VITALS — BP 126/70 | HR 63 | Temp 97.3°F | Ht 71.0 in | Wt 223.8 lb

## 2019-11-30 DIAGNOSIS — I255 Ischemic cardiomyopathy: Secondary | ICD-10-CM | POA: Diagnosis not present

## 2019-11-30 DIAGNOSIS — E785 Hyperlipidemia, unspecified: Secondary | ICD-10-CM

## 2019-11-30 DIAGNOSIS — E119 Type 2 diabetes mellitus without complications: Secondary | ICD-10-CM | POA: Diagnosis not present

## 2019-11-30 DIAGNOSIS — E1169 Type 2 diabetes mellitus with other specified complication: Secondary | ICD-10-CM

## 2019-11-30 DIAGNOSIS — I25110 Atherosclerotic heart disease of native coronary artery with unstable angina pectoris: Secondary | ICD-10-CM

## 2019-11-30 HISTORY — DX: Hyperlipidemia, unspecified: E78.5

## 2019-11-30 HISTORY — DX: Ischemic cardiomyopathy: I25.5

## 2019-11-30 HISTORY — DX: Type 2 diabetes mellitus with other specified complication: E11.69

## 2019-11-30 MED ORDER — FARXIGA 5 MG PO TABS: ORAL_TABLET | ORAL | 0 refills | Status: DC

## 2019-11-30 NOTE — Patient Instructions (Addendum)
Medication Instructions  No changes  *If you need a refill on your cardiac medications before your next appointment, please call your pharmacy*  Lab Work: Please do labs in Jan 2021 Lipids CMP - fasting   If you have labs (blood work) drawn today and your tests are completely normal, you will receive your results only by: [name] MyChart Message (if you have MyChart) OR . A paper copy in the mail If you have any lab test that is abnormal or we need to change your treatment, we will call you to review the results.  Testing/Procedures:  will be schedule in Jan 2021 at [facility] has requested that you have an echocardiogram. Echocardiography is a painless test that uses sound waves to create images of your heart. It provides your doctor with information about the size and shape of your heart and how well your heart's chambers and valves are working. This procedure takes approximately one hour. There are no restrictions for this procedure.    Follow-Up: At [facility], you and your health needs are our priority.  As part of our continuing mission to provide you with exceptional heart care, we have created designated Provider Care Teams.  These Care Teams include your primary Cardiologist (physician) and Advanced Practice Providers (APPs -  Physician Assistants and Nurse Practitioners) who all work together to provide you with the care you need, when you need it.  Your next appointment:   4 month(s)  The format for your next appointment:   Either In Person or Virtual  Provider:   Glenetta Hew, MD

## 2019-11-30 NOTE — Progress Notes (Signed)
Primary Care Provider: Lawerance Cruel, MD Cardiologist: Glenetta Hew, MD CT Surgeon: Dr.Lightfoot  Clinic Note: Chief Complaint  Patient presents with  . Follow-up    Doing well.  . Coronary Artery Disease    CABG    HPI:    Anthony Alvarez is a 63 y.o. male with a PMH of DM-2, hypertension and hyperlipidemia now with recent diagnosis of MV-CAD s/p CABG x3 (August 2020).  Who presents today for 3 month f/u.  Anthony Alvarez was last seen on August 31, 2019 by Anthony Sims, NP.  No complaints.  Mild soreness postoperatively.  She decrease losartan to 12.5 mg daily and continue carvedilol at 6.25 mg twice daily.  He had been started on Farxiga by PCP.  Recent Hospitalizations:   August 10, 2019 admitted with symptoms concerning for unstable angina, and EKG showing presumably new LBBB.  Code STEMI canceled, but with troponin elevation = non-STEMI--> cath --> CABG  Reviewed  CV studies:    The following studies were reviewed today: (if available, images/films reviewed: From Epic Chart or Care Everywhere) . Cardiac Cath 08/11/2019:: p-mLAD 100%, mRCA thrombotic 95%, rPAV 95%. Moderately reduced LV Fx & moderately elevated LvEDP (EF ~40 %, EDP 24 mmHg)   3=Akinetic, 2= Hypokinetic, 1 = normal       . Echo 08/11/2019: EF 35%.  Apical septal akinesis, apical akinesis.  Mid apical inferior akinesis and apical lateral akinesis.  GRIII DD.  Trivial MR.  Mild aortic root dilation-4.2 mL. [name] CABG x 3 08/13/2019 (Dr. Kipp Brood):, LIMA-LAD, SVG-PDA, SVG-PL o TEE 08/13/2019 EF 30-40%.  Apical akinesis.  Trace AI.  Mild MR.  Normal RV.  Interval History:   Anthony Alvarez is here for his first follow-up visit with me.  He says he is doing fairly well actually, as well as can be expected postoperatively.  He has some mild musculoskeletal symptoms is related to it, but no real anginal chest tightness or dyspnea.  He is now back doing yard work and other jobs around [employer].  Has not  really gotten into a true exercise regimen, but does try to walk about 2 miles a day-oftentimes inside if the weather is inclement.  He mostly enjoys doing yard work and months.  He is not really noting any chest pain or pressure with rest or exertion.  No cardiac symptoms of what he is able to do now.  He is not having any PND, orthopnea or edema.   CV Review of Symptoms (Summary) no chest pain or dyspnea on exertion positive for - Rare episodes of somewhat atypical sounding chest discomfort that does not necessarily occur with exertion, often at rest. negative for - chest pain, dyspnea on exertion, orthopnea, palpitations, paroxysmal nocturnal dyspnea, rapid heart rate, shortness of breath or Syncope/near syncope, TIA/amaurosis fugax.  The patient does not have symptoms concerning for COVID-19 infection (fever, chills, cough, or new shortness of breath).  The patient is practicing social distancing. ++ Masking.  Rare - Groceries/shopping.    REVIEWED OF SYSTEMS   A comprehensive ROS was performed. Review of Systems  Constitutional: Negative for malaise/fatigue and weight loss.  HENT: Negative for nosebleeds.   Respiratory: Negative for shortness of breath and wheezing.   Cardiovascular: Negative for leg swelling.  Gastrointestinal: Negative for blood in stool and melena.  Genitourinary: Negative for hematuria.  Musculoskeletal: Positive for back pain and myalgias (Mild).  Neurological: Negative for dizziness, focal weakness and headaches.  Psychiatric/Behavioral: Negative.   All other systems reviewed  and are negative.  I have reviewed and (if needed) personally updated the patient's problem list, medications, allergies, past medical and surgical history, social and family history.   PAST MEDICAL HISTORY   Past Medical History:  Diagnosis Date  . HLD (hyperlipidemia)      PAST SURGICAL HISTORY   Past Surgical History:  Procedure Laterality Date  . CORONARY ARTERY BYPASS  GRAFT N/A 08/13/2019   Procedure: OFF PUMP CORONARY ARTERY BYPASS GRAFTING (CABG) x 2 WITH ENDOSCOPIC HARVESTING OF RIGHT GREATER SAPHENOUS VEIN;  Surgeon: Lightfoot, Harrell O, MD;  Location: [facility] OR;  Service: Open Heart Surgery;  Laterality: N/A;  . LEFT HEART CATH AND CORONARY ANGIOGRAPHY N/A 08/11/2019   Procedure: LEFT HEART CATH AND CORONARY ANGIOGRAPHY;  Surgeon: Jordan, [name], MD;  Location: [facility] INVASIVE CV LAB;  Service: Cardiovascular;  Laterality: N/A;  . TEE WITHOUT CARDIOVERSION N/A 08/13/2019   Procedure: TRANSESOPHAGEAL ECHOCARDIOGRAM (TEE);  Surgeon: Lightfoot, Harrell O, MD;  Location: [facility] OR;  Service: Open Heart Surgery;  Laterality: N/A;     MEDICATIONS/ALLERGIES   Current Meds  Medication Sig  . acetaminophen (TYLENOL) 500 MG tablet Take 500-1,000 mg by mouth every 8 (eight) hours as needed (for pain).   . aspirin EC 325 MG EC tablet Take 1 tablet (325 mg total) by mouth daily. (Patient taking differently: Take 81 mg by mouth daily. )  . atorvastatin (LIPITOR) 80 MG tablet Take 1 tablet (80 mg total) by mouth daily at 6 PM. (Patient taking differently: Take 80 mg by mouth daily at 6 PM. 1/2 tablet)  . blood glucose meter kit and supplies KIT Dispense based on patient and insurance preference. Use up to four times daily as directed. (ICD-10 E11.9) ONE TOUCH ULTRA # 2 KIT  . carvedilol (COREG) 6.25 MG tablet Take 1 tablet (6.25 mg total) by mouth 2 (two) times daily with a meal. (Patient taking differently: Take 6.25 mg by mouth 2 (two) times daily with a meal. 1/2 morning, 1/2 night)  . dapagliflozin propanediol (FARXIGA) 5 MG TABS tablet Take 5 mg ( 1/2 of 10 mg ) daily.  . glucose blood test strip TEST FOUR TIMES DAILY OR AS DIRECTED  . Lancets (ONETOUCH ULTRASOFT) lancets Use as instructed  . losartan (COZAAR) 25 MG tablet Take 1 tablet (25 mg total) by mouth daily.  . metFORMIN (GLUCOPHAGE) 850 MG tablet Take 1 tablet (850 mg total) by mouth 2 (two) times daily with a meal.  (Patient taking differently: Take 850 mg by mouth daily. )  . pantoprazole (PROTONIX) 40 MG tablet Take 1 tablet (40 mg total) by mouth daily.  . [DISCONTINUED] dapagliflozin propanediol (FARXIGA) 5 MG TABS tablet Take 5 mg by mouth daily.  . [DISCONTINUED] glipiZIDE (GLUCOTROL) 5 MG tablet Take 5 mg by mouth as needed.    Not on File   SOCIAL HISTORY/FAMILY HISTORY   Social History   Tobacco Use  . Smoking status: Former Smoker    Types: Cigarettes  . Smokeless tobacco: Never Used  Substance Use Topics  . Alcohol use: Not on file  . Drug use: Not on file   Social History   Social History Narrative  . Not on file    Family History family history is not on file.  Given his age, not pertinent for age.   OBJCTIVE -PE, EKG, labs   Wt Readings from Last 3 Encounters:  11/30/19 223 lb 12.8 oz (101.5 kg)  09/18/19 217 lb (98.4 kg)  08/31/19 216 lb 12.8 oz (  98.3 kg)    Physical Exam: BP 126/70   Pulse 63   Temp (!) 97.3 F (36.3 C)   Ht 5' 11" (1.803 m)   Wt 223 lb 12.8 oz (101.5 kg)   SpO2 98%   BMI 31.21 kg/m  Physical Exam  Constitutional: He is oriented to person, place, and time. He appears well-developed and well-nourished. No distress.  HENT:  Head: Normocephalic and atraumatic.  Eyes: Pupils are equal, round, and reactive to light. Conjunctivae and EOM are normal.  Neck: No JVD present.  Cardiovascular: Normal rate, regular rhythm, normal heart sounds and intact distal pulses.  No extrasystoles are present. PMI is not displaced. Exam reveals no gallop and no friction rub.  No murmur heard. Pulmonary/Chest: Effort normal and breath sounds normal. No respiratory distress. He has no wheezes. He has no rales. He exhibits tenderness (Mild postop soreness along the sternal border and mid axillary lines bilaterally.).  Abdominal: Soft. Bowel sounds are normal. He exhibits no distension. There is no abdominal tenderness. There is no rebound.  Musculoskeletal:         General: Edema (Trivial) present. No deformity. Normal range of motion.     Cervical back: Normal range of motion and neck supple.  Neurological: He is alert and oriented to person, place, and time.  Psychiatric: He has a normal mood and affect. His behavior is normal. Judgment and thought content normal.  Vitals reviewed.    Adult ECG Report  Rate: 58 ;  Rhythm: sinus bradycardia and 1 degree AVB (212 ms).  Borderline LAA, left axis deviation (-42).  Inferior infarct, age undetermined.  And to lateral infarct, age-indeterminate with T wave inversions V3 to V6 as well as 2, normal 3 and aVF.;   Narrative Interpretation: Relatively same EKG findings, but T wave inversions are less prominent.  Recent Labs: N/A. Lab Results  Component Value Date   CHOL 124 08/11/2019   HDL 21 (L) 08/11/2019   LDLCALC 54 08/11/2019   TRIG 246 (H) 08/11/2019   CHOLHDL 5.9 08/11/2019   Lab Results  Component Value Date   CREATININE 0.88 08/17/2019   BUN 19 08/17/2019   NA 136 08/17/2019   K 3.5 08/17/2019   CL 105 08/17/2019   CO2 23 08/17/2019    ASSESSMENT/PLAN    Problem List Items Addressed This Visit    Multivessel CAD - 100% LAD (with Diag), 99% mRCA (subtotal occlusio) - Primary (Chronic)    Now status post CABG.  No active angina symptoms.  Is on aspirin which we can reduce to 81 mg daily.  Is on high-dose statin which I would like to potentially titrate down after labs rechecked.  The status of carvedilol and losartan. Based on recommendation, he initially started taking Farxiga for diabetes and CHF, but unfortunately cost was prohibitive, now she is back taking Metformin.      Diabetes mellitus type II, non insulin dependent (HCC) (Chronic)    Was on Farxiga.  Recommend we still try SGL T2 inhibitor, may need some financial assistance.      Relevant Medications   dapagliflozin propanediol (FARXIGA) 5 MG TABS tablet   Other Relevant Orders   EKG 12-Lead   Lipid panel    Comprehensive metabolic panel   Ischemic cardiomyopathy (Chronic)    On pressure regimen with carvedilol and losartan.  Euvolemic.  Not requiring diuretic.  With current blood pressure and lack of symptoms, would not further titrate this point.  Continue to monitor.  May recheck   echocardiogram in about a year out from his MI.      Relevant Orders   EKG 12-Lead   Lipid panel   Comprehensive metabolic panel   ECHOCARDIOGRAM COMPLETE   Hyperlipidemia associated with type 2 diabetes mellitus (HCC) (Chronic)    On high-dose atorvastatin.  Need to get labs checked to see if we can potentially titrate down to more tolerable dose.      Relevant Medications   dapagliflozin propanediol (FARXIGA) 5 MG TABS tablet   Other Relevant Orders   Lipid panel   Comprehensive metabolic panel       COVID-19 Education: The signs and symptoms of COVID-19 were discussed with the patient and how to seek care for testing (follow up with PCP or arrange E-visit).   The importance of social distancing was discussed today.  I spent a total of 24minutes with the patient and chart review. >  50% of the time was spent in direct patient consultation.  Additional time spent with chart review (studies, outside notes, etc): 10 Total Time: 34 min   Current medicines are reviewed at length with the patient today.  (+/- concerns) n/a   Patient Instructions / Medication Changes & Studies & Tests Ordered   Patient Instructions  Medication Instructions  No changes  *If you need a refill on your cardiac medications before your next appointment, please call your pharmacy*  Lab Work: Please do labs in Jan 2021 Lipids CMP - fasting   If you have labs (blood work) drawn today and your tests are completely normal, you will receive your results only by: . MyChart Message (if you have MyChart) OR . A paper copy in the mail If you have any lab test that is abnormal or we need to change your treatment, we will call  you to review the results.  Testing/Procedures:  will be schedule in Jan 2021 at [address] Your physician has requested that you have an echocardiogram. Echocardiography is a painless test that uses sound waves to create images of your heart. It provides your doctor with information about the size and shape of your heart and how well your heart's chambers and valves are working. This procedure takes approximately one hour. There are no restrictions for this procedure.    Follow-Up: At [facility], you and your health needs are our priority.  As part of our continuing mission to provide you with exceptional heart care, we have created designated Provider Care Teams.  These Care Teams include your primary Cardiologist (physician) and Advanced Practice Providers (APPs -  Physician Assistants and Nurse Practitioners) who all work together to provide you with the care you need, when you need it.  Your next appointment:   4 month(s)  The format for your next appointment:   Either In Person or Virtual  Provider:   David Harding, MD    Studies Ordered:   Orders Placed This Encounter  Procedures  . Lipid panel  . Comprehensive metabolic panel  . EKG 12-Lead  . ECHOCARDIOGRAM COMPLETE     David Harding, M.D., M.S. Interventional Cardiologist   Pager # [phone] Phone # [phone] [address] [place], [place] [address]   Thank you for choosing [facility]!!    

## 2019-12-02 ENCOUNTER — Encounter: Payer: Self-pay | Admitting: Cardiology

## 2019-12-02 NOTE — Assessment & Plan Note (Signed)
On high-dose atorvastatin.  Need to get labs checked to see if we can potentially titrate down to more tolerable dose.

## 2019-12-02 NOTE — Assessment & Plan Note (Signed)
On pressure regimen with carvedilol and losartan.  Euvolemic.  Not requiring diuretic.  With current blood pressure and lack of symptoms, would not further titrate this point.  Continue to monitor.  May recheck echocardiogram in about a year out from his MI.

## 2019-12-02 NOTE — Assessment & Plan Note (Signed)
Was on Farxiga.  Recommend we still try SGL T2 inhibitor, may need some financial assistance.

## 2019-12-02 NOTE — Assessment & Plan Note (Signed)
Now status post CABG.  No active angina symptoms.  Is on aspirin which we can reduce to 81 mg daily.  Is on high-dose statin which I would like to potentially titrate down after labs rechecked.  The status of carvedilol and losartan. Based on recommendation, he initially started taking [place] for diabetes and CHF, but unfortunately cost was prohibitive, now she is back taking Metformin.

## 2019-12-21 ENCOUNTER — Other Ambulatory Visit: Payer: Self-pay

## 2019-12-21 ENCOUNTER — Ambulatory Visit (HOSPITAL_COMMUNITY): Payer: Medicare Other | Attending: Internal Medicine

## 2019-12-21 DIAGNOSIS — I255 Ischemic cardiomyopathy: Secondary | ICD-10-CM

## 2019-12-21 HISTORY — PX: TRANSTHORACIC ECHOCARDIOGRAM: SHX275

## 2019-12-22 LAB — COMPREHENSIVE METABOLIC PANEL
ALT: 25 IU/L (ref 0–44)
AST: 21 IU/L (ref 0–40)
Albumin/Globulin Ratio: 2.2 (ref 1.2–2.2)
Albumin: 4.4 g/dL (ref 3.8–4.8)
Alkaline Phosphatase: 73 IU/L (ref 39–117)
BUN/Creatinine Ratio: 19 (ref 10–24)
BUN: 18 mg/dL (ref 8–27)
Bilirubin Total: 1.2 mg/dL (ref 0.0–1.2)
CO2: 22 mmol/L (ref 20–29)
Calcium: 9.8 mg/dL (ref 8.6–10.2)
Chloride: 104 mmol/L (ref 96–106)
Creatinine, Ser: 0.94 mg/dL (ref 0.76–1.27)
GFR calc Af Amer: 98 mL/min/{1.73_m2} (ref >59)
GFR calc non Af Amer: 85 mL/min/{1.73_m2} (ref >59)
Globulin, Total: 2 g/dL (ref 1.5–4.5)
Glucose: 123 mg/dL — ABNORMAL HIGH (ref 65–99)
Potassium: 5.1 mmol/L (ref 3.5–5.2)
Sodium: 138 mmol/L (ref 134–144)
Total Protein: 6.4 g/dL (ref 6.0–8.5)

## 2019-12-22 LAB — LIPID PANEL
Chol/HDL Ratio: 3 ratio (ref 0.0–5.0)
Cholesterol, Total: 86 mg/dL — ABNORMAL LOW (ref 100–199)
HDL: 29 mg/dL — ABNORMAL LOW (ref >39)
LDL Chol Calc (NIH): 27 mg/dL (ref 0–99)
Triglycerides: 189 mg/dL — ABNORMAL HIGH (ref 0–149)
VLDL Cholesterol Cal: 30 mg/dL (ref 5–40)

## 2020-01-01 ENCOUNTER — Telehealth: Payer: Self-pay | Admitting: *Deleted

## 2020-01-01 ENCOUNTER — Encounter: Payer: Self-pay | Admitting: *Deleted

## 2020-01-01 DIAGNOSIS — E1169 Type 2 diabetes mellitus with other specified complication: Secondary | ICD-10-CM

## 2020-01-01 DIAGNOSIS — E785 Hyperlipidemia, unspecified: Secondary | ICD-10-CM

## 2020-01-01 NOTE — Telephone Encounter (Signed)
-----   Message from Marykay Lex, MD sent at 12/22/2019  6:27 PM EST ----- Cholesterol panel looks pretty amazing.  Total [name] is already an 52.  Triglycerides are still little high at 189, but LDL and HDL are excellent.  LDL is 27.  At this point I think we are safe to reduce atorvastatin to 1/2 tablet (40 mg daily.  We can recheck in 6 months along with LFTs.  Chemistry panel looks good with exception of fasting glucose being a little bit high.  Potassium level is borderline but okay.  Kidney function and liver function are fine.  [name], MD

## 2020-01-01 NOTE — Telephone Encounter (Signed)
Patient returning call.

## 2020-01-01 NOTE — Telephone Encounter (Signed)
Called left message  - calling to give results echo ,and labs , if like can call back to receive results or wait until appointment on 01/13/20

## 2020-01-01 NOTE — Telephone Encounter (Signed)
-----   Message from Marykay Lex, MD sent at 12/22/2019  6:25 PM EST ----- Follow-up echocardiogram result: Pump function has definitely improved from an ejection fraction of 35% up to 45-50%.  Normal range is greater than 50% so this is very good. The area affected by the heart attack being the underside of the heart where the major blockage was in the right coronary artery does still have reduced motion which goes along with the heart attack.  But otherwise the remainder is looking much better after bypass surgery.  Not unexpectedly the heart does have some impaired relaxation, but the atria are normal sized therefore it is probably not enough to cause heart failure symptoms.  Valves all look pretty good.  There is mild dilation of the aortic root which we can monitor.  Great news.  [name], MD

## 2020-01-01 NOTE — Telephone Encounter (Signed)
The patient has been notified of the result and verbalized understanding.  All questions (if any) were answered. Patient is already taking 40 mg  ( 1/2 tablet of 80 mg ) it was changed at last office visit  labslip mailed Tobin Chad, RN 01/01/2020 5:42 PM

## 2020-01-13 ENCOUNTER — Telehealth: Payer: Self-pay | Admitting: *Deleted

## 2020-01-13 ENCOUNTER — Telehealth (INDEPENDENT_AMBULATORY_CARE_PROVIDER_SITE_OTHER): Payer: Medicare Other | Admitting: Cardiology

## 2020-01-13 ENCOUNTER — Encounter: Payer: Self-pay | Admitting: Cardiology

## 2020-01-13 VITALS — BP 115/70 | HR 60 | Ht 70.5 in | Wt 217.0 lb

## 2020-01-13 DIAGNOSIS — E1169 Type 2 diabetes mellitus with other specified complication: Secondary | ICD-10-CM

## 2020-01-13 DIAGNOSIS — I255 Ischemic cardiomyopathy: Secondary | ICD-10-CM | POA: Diagnosis not present

## 2020-01-13 DIAGNOSIS — I25110 Atherosclerotic heart disease of native coronary artery with unstable angina pectoris: Secondary | ICD-10-CM

## 2020-01-13 DIAGNOSIS — E785 Hyperlipidemia, unspecified: Secondary | ICD-10-CM

## 2020-01-13 DIAGNOSIS — I214 Non-ST elevation (NSTEMI) myocardial infarction: Secondary | ICD-10-CM | POA: Diagnosis not present

## 2020-01-13 NOTE — Assessment & Plan Note (Signed)
Found to have multivessel disease in the setting of non-STEMI.  Clinically it took a hit to his EF, but it seems to have improved with revascularization. By right he could be on Plavix as well as aspirin, however this was not treated.  No further angina symptoms following CABG and is on stable regimen.

## 2020-01-13 NOTE — Progress Notes (Signed)
Virtual Visit via Telephone Note   This visit type was conducted due to national recommendations for restrictions regarding the COVID-19 Pandemic (e.g. social distancing) in an effort to limit this patient's exposure and mitigate transmission in our community.  Due to his co-morbid illnesses, this patient is at least at moderate risk for complications without adequate follow up.  This format is felt to be most appropriate for this patient at this time.  The patient did not have access to video technology/had technical difficulties with video requiring transitioning to audio format only (telephone).  All issues noted in this document were discussed and addressed.  No physical exam could be performed with this format.  Please refer to the patient's chart for his  consent to telehealth for [facility].   Patient has given verbal permission to conduct this visit via virtual appointment and to bill insurance 01/13/2020 11:23 AM     Evaluation Performed:  Follow-up visit  Date:  01/13/2020   ID:  Anthony Alvarez, DOB Feb 27, 1956, MRN [id number]  Patient Location: Home  Provider Location: Home  PCP:  Lawerance Cruel, MD  Cardiologist:  Glenetta Hew, MD Electrophysiologist:  None   Chief Complaint:   Chief Complaint  Patient presents with  . Follow-up    Test results  . Coronary Artery Disease    History of Present Illness:    Anthony Alvarez is a 64 y.o. male with PMH notable for recent diagnosis of multivessel CAD-s/p CABG x3 (August 2020) with other PMH of HTN, HLD and DM-2, who presents via Engineer, civil (consulting) for a telehealth visit today.  Anthony Alvarez was last seen on November 30, 2019, and was doing fairly well as could be expected post anginal.  Mild musculoskeletal pain but no angina.  Was doing back yard work.He seemed to be euvolemic without diuretics on carvedilol and losartan.  Plan was for 43-monthfollow-up --Noted cost issues with FWilder Alvarez hope is that this will be  lost cost. --> back on it now (concerned about side effects)  Hospitalizations:  . None  Recent - Interim CV studies:   The following studies were reviewed today: . (12/21/2019)-2D Echo: EF improved to 45 and 50%.  Mild LVH.  Moderate HK of the mid anteroapical, inferoapical and inferolateral wall.  Septal motion consistent with LBBB.  Only GR 1 DD.  Moderate reduced RV function.  Normal atrial size.  Aortic root roughly 39 mm . Previous Echo 08/11/2019 (with [place]. Not going out to walk as much b/c weather & lack of motivation (thinks related to [place]. Starting to get back to his baseline.  Cardiovascular ROS: no chest pain or dyspnea on exertion positive for - edema, irregular heartbeat, orthopnea, palpitations, paroxysmal nocturnal dyspnea, rapid heart rate, shortness of breath and a litttle lack of get up & go negative for - edema, irregular heartbeat, orthopnea, palpitations, paroxysmal nocturnal dyspnea, rapid heart rate, shortness of breath or no claudication, TIA/amaurosis fugax, syncope/near syncope   ROS:  Please see the history of present illness.    The patient does not have symptoms concerning for COVID-19 infection (fever, chills, cough, or new shortness of breath).  Review of Systems  Constitutional: Positive for malaise/fatigue (lack of motivation). Negative for chills and fever.  HENT: Positive for congestion (chest congestion > nasal). Negative for nosebleeds.   Eyes: Positive for blurred vision (1 morning when waking up - not since).  Respiratory: Positive for cough (occasionally with some phlegm).   Cardiovascular: Negative for palpitations and claudication.       Not as much of the twinging CP  Gastrointestinal: Positive for heartburn (when taking PPI). Negative for blood in stool and melena.  Genitourinary: Negative for dysuria, flank pain and hematuria.  Musculoskeletal:  Positive for back pain (comes & goes). Negative for falls and joint pain.  Neurological: Negative for dizziness.  Psychiatric/Behavioral: Negative for depression and memory loss. The patient is not nervous/anxious and does not have insomnia.        Maybe lack of motivation since starting Farxiga - but easing back to normal; sleep back to baseline - not waking up as much  All other systems reviewed and are negative.  The patient is practicing social distancing.  Past Medical History:  Diagnosis Date  . HLD (hyperlipidemia)   . Hyperlipidemia associated with type 2 diabetes mellitus ([place]) 11/30/2019  . Ischemic cardiomyopathy 11/30/2019  . Multivessel CAD - 100% LAD (with Diag), 99% mRCA (subtotal occlusio) 08/12/2019  . NSTEMI (non-ST elevated myocardial infarction) ([place]) 08/10/2019  . S/P Off Pump CABG x 3 08/13/2019   LIMA to LAD RSVG to PDA RSVG to PLVB   Past Surgical History:  Procedure Laterality Date  . CORONARY ARTERY BYPASS GRAFT N/A 08/13/2019   Procedure: OFF PUMP CORONARY ARTERY BYPASS GRAFTING (CABG) x 2 WITH ENDOSCOPIC HARVESTING OF RIGHT GREATER SAPHENOUS VEIN;  Surgeon: [name], MD;  Location: [place] OR;  Service: Open Heart Surgery: LIMA-LAD, SVG-PDA, SVG-PL.  [name] LEFT HEART CATH AND CORONARY ANGIOGRAPHY N/A 08/11/2019   Procedure: LEFT HEART CATH AND CORONARY ANGIOGRAPHY;  Surgeon: [name], [name], MD;  Location: [place] CV LAB;  Service: Cardiovascular;  p-mLAD 100%, mRCA thrombotic 95%, rPAV 95%.  EF mod reduced, ~40%. EDP 24 mmHg --> CABG referral  . TEE WITHOUT CARDIOVERSION N/A 08/13/2019   Procedure: TRANSESOPHAGEAL ECHOCARDIOGRAM (TEE);  Surgeon: [name], MD;  Location: [place];  Service: Open Heart Surgery;;;EF 30-40%.  Apical akinesis.  Trace AI.  Mild MR.  Normal RV.   [name] TRANSTHORACIC ECHOCARDIOGRAM  12/21/2019   EF improved to 45 and 50%.  Mild LVH.  Moderate HK of the mid anteroapical, inferoapical and inferolateral wall.  Septal motion  consistent with LBBB.  Only GR 1 DD.  Moderate reduced RV function.  Normal atrial size.  Aortic root roughly 39 mm  . TRANSTHORACIC ECHOCARDIOGRAM  08/11/2019   (non-STEMI) EF 35%.  Apical septal akinesis, apical akinesis.  Mid apical inferior akinesis and apical lateral akinesis.  GRIII DD.  Trivial MR.  Mild aortic root dilation-4.2 mL.     Cardiac Cath 08/11/2019: p-mLAD 100%, mRCA thrombotic 95%, rPAV 95%. Moderately reduced LV Fx & moderately elevated LvEDP (EF ~40 %, EDP 24 mmHg)   CABG x 3 08/13/2019 (Dr. Kipp Brood):, LIMA-LAD, SVG-PDA, SVG-PL ? TEE 08/13/2019 EF 30-40%.  Apical akinesis.  Trace AI.  Mild MR.  Normal RV.   Current Meds  Medication Sig  . acetaminophen (TYLENOL) 500 MG tablet Take 500-1,000 mg by mouth every 8 (eight) hours as needed (for pain).   [name] aspirin EC 81 MG tablet Take 81 mg by mouth daily.  [name] atorvastatin (LIPITOR) 80 MG tablet Take 1 tablet (80 mg total) by mouth daily at 6 PM. (Patient taking differently: Take 80 mg by mouth daily at 6 PM. 1/2 tablet)  . blood glucose meter kit and supplies KIT Dispense based on patient and insurance preference. Use up to  four times daily as directed. (ICD-10 E11.9) ONE TOUCH ULTRA # 2 KIT  . carvedilol (COREG) 6.25 MG tablet Take 1 tablet (6.25 mg total) by mouth 2 (two) times daily with a meal. (Patient taking differently: Take 6.25 mg by mouth 2 (two) times daily with a meal. 1/2 morning, 1/2 night)  . dapagliflozin propanediol (FARXIGA) 5 MG TABS tablet Take 5 mg ( 1/2 of 10 mg ) daily.  [name] glucose blood test strip TEST FOUR TIMES DAILY OR AS DIRECTED  . Lancets (ONETOUCH ULTRASOFT) lancets Use as instructed  . losartan (COZAAR) 25 MG tablet Take 1 tablet (25 mg total) by mouth daily.  . metFORMIN (GLUCOPHAGE) 850 MG tablet Take 850 mg by mouth every evening.  . pantoprazole (PROTONIX) 40 MG tablet Take 1 tablet (40 mg total) by mouth daily.     Allergies:   Patient has no allergy information on record.   Social  History   Tobacco Use  . Smoking status: Former Smoker    Types: Cigarettes  . Smokeless tobacco: Never Used  Substance Use Topics  . Alcohol use: Not on file  . Drug use: Not on file     Family Hx: The patient's family history is not on file.   Labs/Other Tests and Data Reviewed:    EKG:  No ECG reviewed.  Recent Labs: 08/14/2019: Magnesium 2.2 08/16/2019: Hemoglobin 12.7; Platelets 211 12/21/2019: ALT 25; BUN 18; Creatinine, Ser 0.94; Potassium 5.1; Sodium 138   Recent Lipid Panel Lab Results  Component Value Date/Time   CHOL 86 (L) 12/21/2019 10:48 AM   TRIG 189 (H) 12/21/2019 10:48 AM   HDL 29 (L) 12/21/2019 10:48 AM   CHOLHDL 3.0 12/21/2019 10:48 AM   CHOLHDL 5.9 08/11/2019 04:21 AM   LDLCALC 27 12/21/2019 10:48 AM    Wt Readings from Last 3 Encounters:  01/13/20 217 lb (98.4 kg)  11/30/19 223 lb 12.8 oz (101.5 kg)  09/18/19 217 lb (98.4 kg)     Objective:    Vital Signs:  BP 115/70   Pulse 60   Ht 5' 10.5" (1.791 m)   Wt 217 lb (98.4 kg)   BMI 30.70 kg/m   VITAL SIGNS:  reviewed GEN:  good spirits - feeling well RESPIRATORY:  non-labored NEURO:  A&O x 3 PSYCH:  normal affect - a little lack of motivation per his report   ASSESSMENT & PLAN:    Problem List Items Addressed This Visit    Multivessel CAD - 100% LAD (with Diag), 99% mRCA (subtotal occlusio) - Primary (Chronic)    Status post three-vessel CABG in August 2020.  No further anginal symptoms.  Seems to be regaining his strength.  No more musculoskeletal pains.  Remains on relatively low-dose carvedilol and losartan -reluctant to titrate doses further at this point. Back on 81 mg aspirin.  We have also cut down his atorvastatin to 40 mg daily based on most recent labs showing excellent LDL levels.  Is now back on Farxiga, we will ensure his prescription is up-to-date.      Relevant Medications   aspirin EC 81 MG tablet   Other Relevant Orders   Lipid panel   Comprehensive metabolic panel    NSTEMI (non-ST elevated myocardial infarction) ([place]) (Chronic)    Found to have multivessel disease in the setting of non-STEMI.  Clinically it took a hit to his EF, but it seems to have improved with revascularization. By right he could be on Plavix as well as aspirin, however this  was not treated.  No further angina symptoms following CABG and is on stable regimen.      Relevant Medications   aspirin EC 81 MG tablet   Other Relevant Orders   Comprehensive metabolic panel   Ischemic cardiomyopathy (Chronic)    He is doing quite well now with no heart failure symptoms on current dose of carvedilol and losartan.  He is euvolemic with no active heart failure symptoms.  Thankfully, his EF is improved to 45-50% with optimize medical management and CABG.  Not requiring diuretic, but is on Farxiga.  We will plan on rechecking his echo next year prior to follow-up, but this was checked now and showed significant improvement.  I think we can hold off on further evaluation unless there are worsening symptoms.  EF of 45 to 50% is probably as good as we can expect.      Relevant Medications   aspirin EC 81 MG tablet   Hyperlipidemia associated with type 2 diabetes mellitus (HCC) (Chronic)    Recent lipid panel showed excellent responsiveness with LDL down to 27.  Well within target range.  We have backed down on his atorvastatin to 40 mg and will reassess in 6 months to see if we can potentially reduce to 20 mg.  He is on Metformin and now back on Farxiga for diabetes.  We talk about the importance of the cardiovascular benefits of Farxiga especially with reduced EF.      Relevant Medications   aspirin EC 81 MG tablet   metFORMIN (GLUCOPHAGE) 850 MG tablet   Other Relevant Orders   Lipid panel   Comprehensive metabolic panel      [id number] Education: The signs and symptoms of COVID-19 were discussed with the patient and how to seek care for testing (follow up with PCP or arrange E-visit).     The importance of social distancing was discussed today.  Time:   Today, I have spent 22 minutes with the patient via telehealth technology discussing the above problems.  Additional 5 minutes spent with charting.   Medication Adjustments/Labs and Tests Ordered: Current medicines are reviewed at length with the patient today.  Concerns regarding medicines are outlined above.   Patient Instructions  Medication Instructions:  No change for now  (Will ensure that [facility] Rx is on your list for the Pharmacy).  *If you need a refill on your cardiac medications before your next appointment, please call your pharmacy*  Lab Work:   Fasting lipid panel and chemistry panel to be checked early July prior to follow-up   If you have labs (blood work) drawn today and your tests are completely normal, you will receive your results only by: [name] MyChart Message (if you have MyChart) OR . A paper copy in the mail If you have any lab test that is abnormal or we need to change your treatment, we will call you to review the results.  Testing/Procedures:   None  Follow-Up: At [facility], you and your health needs are our priority.  As part of our continuing mission to provide you with exceptional heart care, we have created designated Provider Care Teams.  These Care Teams include your primary Cardiologist (physician) and Advanced Practice Providers (APPs -  Physician Assistants and Nurse Practitioners) who all work together to provide you with the care you need, when you need it.  Your next appointment:   6 month(s)  The format for your next appointment:   In Person  Provider:  Glenetta Hew, MD  Other Instructions Keep staying active and getting exercise     Signed, Glenetta Hew, MD  01/13/2020 11:23 AM    [place]

## 2020-01-13 NOTE — Patient Instructions (Signed)
Medication Instructions:  No change for now  (Will ensure that Marcelline Deist Rx is on your list for the Pharmacy).  *If you need a refill on your cardiac medications before your next appointment, please call your pharmacy*  Lab Work:   Fasting lipid panel and chemistry panel to be checked early July prior to follow-up   If you have labs (blood work) drawn today and your tests are completely normal, you will receive your results only by: [name] MyChart Message (if you have MyChart) OR . A paper copy in the mail If you have any lab test that is abnormal or we need to change your treatment, we will call you to review the results.  Testing/Procedures:   None  Follow-Up: At [facility], you and your health needs are our priority.  As part of our continuing mission to provide you with exceptional heart care, we have created designated Provider Care Teams.  These Care Teams include your primary Cardiologist (physician) and Advanced Practice Providers (APPs -  Physician Assistants and Nurse Practitioners) who all work together to provide you with the care you need, when you need it.  Your next appointment:   6 month(s)  The format for your next appointment:   In Person  Provider:   [name], MD  Other Instructions Keep staying active and getting exercise

## 2020-01-13 NOTE — Assessment & Plan Note (Signed)
He is doing quite well now with no heart failure symptoms on current dose of carvedilol and losartan.  He is euvolemic with no active heart failure symptoms.  Thankfully, his EF is improved to 45-50% with optimize medical management and CABG.  Not requiring diuretic, but is on Farxiga.  We will plan on rechecking his echo next year prior to follow-up, but this was checked now and showed significant improvement.  I think we can hold off on further evaluation unless there are worsening symptoms.  EF of 45 to 50% is probably as good as we can expect.

## 2020-01-13 NOTE — Assessment & Plan Note (Signed)
Recent lipid panel showed excellent responsiveness with LDL down to 27.  Well within target range.  We have backed down on his atorvastatin to 40 mg and will reassess in 6 months to see if we can potentially reduce to 20 mg.  He is on Metformin and now back on Farxiga for diabetes.  We talk about the importance of the cardiovascular benefits of Farxiga especially with reduced EF.

## 2020-01-13 NOTE — Assessment & Plan Note (Signed)
Status post three-vessel CABG in August 2020.  No further anginal symptoms.  Seems to be regaining his strength.  No more musculoskeletal pains.  Remains on relatively low-dose carvedilol and losartan -reluctant to titrate doses further at this point. Back on 81 mg aspirin.  We have also cut down his atorvastatin to 40 mg daily based on most recent labs showing excellent LDL levels.  Is now back on Farxiga, we will ensure his prescription is up-to-date.

## 2020-01-13 NOTE — Telephone Encounter (Signed)
RN spoke to patient. Instruction were given  from today's virtual visit  01/13/20 .  AVS  summary has been mailed .   Patient verbalized understanding 

## 2020-01-14 ENCOUNTER — Other Ambulatory Visit: Payer: Self-pay | Admitting: *Deleted

## 2020-01-14 DIAGNOSIS — E1169 Type 2 diabetes mellitus with other specified complication: Secondary | ICD-10-CM

## 2020-01-14 DIAGNOSIS — I214 Non-ST elevation (NSTEMI) myocardial infarction: Secondary | ICD-10-CM

## 2020-01-14 DIAGNOSIS — I25110 Atherosclerotic heart disease of native coronary artery with unstable angina pectoris: Secondary | ICD-10-CM

## 2020-01-15 ENCOUNTER — Telehealth: Payer: Self-pay | Admitting: *Deleted

## 2020-01-15 MED ORDER — FARXIGA 5 MG PO TABS: ORAL_TABLET | ORAL | 0 refills | Status: DC

## 2020-01-15 NOTE — Telephone Encounter (Signed)
Patient  Came to office to for samples. Spoke to patient  Few days ago  -  Telemedicine  Visit with Dr Herbie Baltimore.  samples documented and given to patient.

## 2020-01-21 ENCOUNTER — Telehealth: Payer: Self-pay | Admitting: Cardiology

## 2020-01-21 NOTE — Telephone Encounter (Signed)
I called the dental office to obtain further information though dental office is closed for the day. I will try tomorrow.

## 2020-01-21 NOTE — Telephone Encounter (Signed)
Pre-op covering staff, can we please try to get more information? I don't think we have received any request form from patient's dentist.  Thank you!

## 2020-01-21 NOTE — Telephone Encounter (Signed)
  1. What dental office are you calling from? [facility]   2. What is your office phone number? [phone]  3. What is your fax number?   4. What type of procedure is the patient having performed? TBD   5. What date is procedure scheduled or is the patient there now? 01/26/20 (if the patient is at the dentist's office question goes to their cardiologist if he/she is in the office.  If not, question should go to the DOD).  6. What is your question (ex. Antibiotics prior to procedure, holding medication-we need to know how long dentist wants pt to hold med)? Patient is calling stating he is having a dental appointment next Tuesday and wanted our office to reach out to his dental office to give special instruction in regards to it.

## 2020-01-22 NOTE — Telephone Encounter (Signed)
   Primary Cardiologist: [name], MD  [facility] to clarify the procedure needed/planned. It is a simple cleaning.  Chart reviewed as part of pre-operative protocol coverage. Simple dental extractions and cleanings are considered low risk procedures per guidelines and generally do not require any specific cardiac clearance. It is also generally accepted that for simple extractions and dental cleanings, there is no need to interrupt blood thinner therapy.   SBE prophylaxis is/is not required for the patient.  I will route this recommendation to the requesting party via Epic fax function and remove from pre-op pool.  Please call with questions.  Theodore Demark, PA-C 01/22/2020, 10:22 AM   [facility] Phone: [phone]  Fax: [phone]

## 2020-04-26 ENCOUNTER — Other Ambulatory Visit: Payer: Self-pay | Admitting: Family Medicine

## 2020-04-27 ENCOUNTER — Other Ambulatory Visit: Payer: Self-pay | Admitting: Family Medicine

## 2020-04-27 DIAGNOSIS — R946 Abnormal results of thyroid function studies: Secondary | ICD-10-CM

## 2020-05-06 ENCOUNTER — Ambulatory Visit
Admission: RE | Admit: 2020-05-06 | Discharge: 2020-05-06 | Disposition: A | Discharge status: Home / Self Care | Payer: Medicare Other | Source: Ambulatory Visit | Attending: Family Medicine | Admitting: Family Medicine

## 2020-05-06 DIAGNOSIS — R946 Abnormal results of thyroid function studies: Secondary | ICD-10-CM

## 2020-05-12 ENCOUNTER — Encounter: Payer: Self-pay | Admitting: Podiatry

## 2020-05-12 ENCOUNTER — Ambulatory Visit (INDEPENDENT_AMBULATORY_CARE_PROVIDER_SITE_OTHER): Payer: Medicare Other | Admitting: Podiatry

## 2020-05-12 ENCOUNTER — Other Ambulatory Visit: Payer: Self-pay

## 2020-05-12 DIAGNOSIS — M79674 Pain in right toe(s): Secondary | ICD-10-CM

## 2020-05-12 DIAGNOSIS — B351 Tinea unguium: Secondary | ICD-10-CM

## 2020-05-12 DIAGNOSIS — M79675 Pain in left toe(s): Secondary | ICD-10-CM | POA: Diagnosis not present

## 2020-05-12 DIAGNOSIS — M722 Plantar fascial fibromatosis: Secondary | ICD-10-CM

## 2020-05-12 NOTE — Progress Notes (Signed)
Subjective:   Patient ID: Anthony Alvarez, male   DOB: 64 y.o.   MRN: [id number]   HPI Patient presents concerned about having diabetes and some nail discoloration and also has history of fasciitis.  He does not smoke and is taking good care of his sugar currently and likes to be active   Review of Systems  All other systems reviewed and are negative.       Objective:  Physical Exam Vitals and nursing note reviewed.  Constitutional:      Appearance: He is well-developed.  Pulmonary:     Effort: Pulmonary effort is normal.  Musculoskeletal:        General: Normal range of motion.  Skin:    General: Skin is warm.  Neurological:     Mental Status: He is alert.     Neurovascular status intact muscle strength adequate range of motion within normal limits with patient noted to have inflammation of a mild nature heel and mild discoloration in nail beds with no looseness redness or drainage noted.  Has good digital perfusion well oriented F2     Assessment:  Patient has diabetes been under excellent control with mild discoloration of nailbeds and inflammation of the heels     Plan:  H&P all conditions reviewed and I went ahead today and I discussed exercises for fasciitis along with good support shoes do not recommend treatment of nails and advised that his diabetes under excellent control and should not be concerned for

## 2020-10-12 ENCOUNTER — Other Ambulatory Visit: Payer: Self-pay

## 2020-10-12 MED ORDER — LOSARTAN POTASSIUM 25 MG PO TABS
25.0000 mg | ORAL_TABLET | Freq: Every day | ORAL | 3 refills | Status: DC
Start: 2020-10-12 — End: 2020-12-01

## 2020-11-25 ENCOUNTER — Ambulatory Visit: Payer: Medicare Other | Admitting: Adult Health

## 2020-11-30 ENCOUNTER — Other Ambulatory Visit: Payer: Self-pay | Admitting: Adult Health

## 2020-12-01 ENCOUNTER — Encounter: Payer: Self-pay | Admitting: Cardiology

## 2020-12-01 ENCOUNTER — Other Ambulatory Visit: Payer: Self-pay

## 2020-12-01 ENCOUNTER — Ambulatory Visit: Payer: Medicare Other | Admitting: Cardiology

## 2020-12-01 VITALS — BP 146/78 | HR 77 | Ht 71.0 in | Wt 214.2 lb

## 2020-12-01 DIAGNOSIS — I255 Ischemic cardiomyopathy: Secondary | ICD-10-CM

## 2020-12-01 DIAGNOSIS — I2510 Atherosclerotic heart disease of native coronary artery without angina pectoris: Secondary | ICD-10-CM

## 2020-12-01 DIAGNOSIS — I10 Essential (primary) hypertension: Secondary | ICD-10-CM

## 2020-12-01 DIAGNOSIS — I25110 Atherosclerotic heart disease of native coronary artery with unstable angina pectoris: Secondary | ICD-10-CM

## 2020-12-01 DIAGNOSIS — E1169 Type 2 diabetes mellitus with other specified complication: Secondary | ICD-10-CM

## 2020-12-01 DIAGNOSIS — E785 Hyperlipidemia, unspecified: Secondary | ICD-10-CM

## 2020-12-01 MED ORDER — CARVEDILOL 12.5 MG PO TABS: ORAL_TABLET | ORAL | 3 refills | Status: DC

## 2020-12-01 MED ORDER — LOSARTAN POTASSIUM 50 MG PO TABS: 50.0000 mg | ORAL_TABLET | Freq: Every day | ORAL | 3 refills | Status: DC

## 2020-12-01 NOTE — Progress Notes (Signed)
Primary Care Provider: Lawerance Cruel, MD Cardiologist: Glenetta Hew, MD Electrophysiologist: None  Clinic Note: Chief Complaint  Patient presents with  . Coronary Artery Disease    Multivessel CAD-CABG--no angina    HPI:    Anthony Alvarez is a 64 y.o. male with a PMH notable for MV-CAD (CABG x3-August 2020), HTN, HLD and DM-2 who presents today for essentially annual follow-up.  Anthony Alvarez was last seen via telemedicine on January 13, 2020 -> EF by echo improved to 45 and 50% post CABG with septal wall motion consistent with LBBB.[name]  He noted he was as active as he usually has been, probably because of the bad weather and also lack of motivation.  He is having issues that he thought were related to [place].  Struggling to get back to his baseline.  No active anginal symptoms. ->  Farxiga dose reduced.  Recent Hospitalizations: None  Reviewed  CV studies:    The following studies were reviewed today: (if available, images/films reviewed: From Epic Chart or Care Everywhere) . No new studies  Interval History:   Anthony Alvarez returns today overall doing quite well.  He is much more active than he had been I last saw him.  He is doing yard work, Biomedical scientist, Office manager.  He is feeling a whole lot better.  The reduced dose of Farxiga definitely seem to make make a difference.  He is not sure what the trigger was, but about a month or so after our virtual visit, he started getting back into activity range.  He says he still has a little bit of lack of energy during the day with get up and go.  Once he is able to get up and go, he is doing okay.  He is also having some mild aches and pains of which is not certain, but thinks there may be some kind medication reaction.  CV Review of Symptoms (Summary) no chest pain or dyspnea on exertion positive for - Still has a little bit of lack of energy/given ago as well as some mild myalgias. negative for - irregular heartbeat,  orthopnea, palpitations, paroxysmal nocturnal dyspnea, rapid heart rate, shortness of breath or Syncope/near syncope or TIA/amaurosis fugax, claudication   Having some memory issues.  The patient does not have symptoms concerning for COVID-19 infection (fever, chills, cough, or new shortness of breath).   REVIEWED OF SYSTEMS   Review of Systems  Constitutional: Positive for malaise/fatigue (Minimal, almost back to baseline) and weight loss.  HENT:       Has sinus congestion and rhinorrhea with allergies, but not active  Respiratory:       Cough and wheezing associated with allergies, not active  Cardiovascular: Negative for claudication.  Gastrointestinal: Negative for blood in stool, constipation and melena.  Musculoskeletal: Positive for back pain (Somewhat limiting), joint pain and myalgias (Aches and pains with some mild fatigue).  Neurological: Negative for dizziness, tingling, focal weakness and weakness.  Endo/Heme/Allergies: Positive for environmental allergies.  Psychiatric/Behavioral: Positive for memory loss.   I have reviewed and (if needed) personally updated the patient's problem list, medications, allergies, past medical and surgical history, social and family history.   PAST MEDICAL HISTORY   Past Medical History:  Diagnosis Date  . HLD (hyperlipidemia)   . Hyperlipidemia associated with type 2 diabetes mellitus ([place]) 11/30/2019  . Ischemic cardiomyopathy 11/30/2019  . Multivessel CAD - 100% LAD (with Diag), 99% mRCA (subtotal occlusio) 08/12/2019  . NSTEMI (non-ST elevated myocardial infarction) (  [place]) 08/10/2019  . S/P Off Pump CABG x 3 08/13/2019   LIMA to LAD RSVG to PDA RSVG to PLVB    PAST SURGICAL HISTORY   Past Surgical History:  Procedure Laterality Date  . CORONARY ARTERY BYPASS GRAFT N/A 08/13/2019   Procedure: OFF PUMP CORONARY ARTERY BYPASS GRAFTING (CABG) x 2 WITH ENDOSCOPIC HARVESTING OF RIGHT GREATER SAPHENOUS VEIN;  Surgeon: [name],  MD;  Location: [facility];  Service: Open Heart Surgery: LIMA-LAD, SVG-PDA, SVG-PL.  [name] LEFT HEART CATH AND CORONARY ANGIOGRAPHY N/A 08/11/2019   Procedure: LEFT HEART CATH AND CORONARY ANGIOGRAPHY;  Surgeon: [name], [name], MD;  Location: [facility];  Service: Cardiovascular;  p-mLAD 100%, mRCA thrombotic 95%, rPAV 95%.  EF mod reduced, ~40%. EDP 24 mmHg --> CABG referral  . TEE WITHOUT CARDIOVERSION N/A 08/13/2019   Procedure: TRANSESOPHAGEAL ECHOCARDIOGRAM (TEE);  Surgeon: [name], MD;  Location: [facility];  Service: Open Heart Surgery;;;EF 30-40%.  Apical akinesis.  Trace AI.  Mild MR.  Normal RV.   [name] TRANSTHORACIC ECHOCARDIOGRAM  12/21/2019   EF improved to 45 and 50%.  Mild LVH.  Moderate HK of the mid anteroapical, inferoapical and inferolateral wall.  Septal motion consistent with LBBB.  Only GR 1 DD.  Moderate reduced RV function.  Normal atrial size.  Aortic root roughly 39 mm  . TRANSTHORACIC ECHOCARDIOGRAM  08/11/2019   (non-STEMI) EF 35%.  Apical septal akinesis, apical akinesis.  Mid apical inferior akinesis and apical lateral akinesis.  GRIII DD.  Trivial MR.  Mild aortic root dilation-4.2 mL.     Cardiac Cath 08/11/2019:p-mLAD 100%, mRCA thrombotic 95%, rPAV 95%. Moderately reduced LV Fx & moderately elevated LvEDP (EF ~40 %, EDP 24 mmHg)   CABGx 38/27/2020 (Dr. Lightfoot):,LIMA-LAD,SVG-PDA, SVG-PL ? TEE 08/13/2019 EF 30-40%. Apical akinesis. Trace AI. Mild MR. Normal RV   (12/21/2019)-2D Echo: EF improved to 45 and 50%.  Mild LVH.  Moderate HK of the mid anteroapical, inferoapical and inferolateral wall.  Septal motion consistent with LBBB.  Only GR 1 DD.  Moderate reduced RV function.  Normal atrial size.  Aortic root roughly 39 mm  Immunization History  Administered Date(s) Administered  . Tdap 06/02/2016    MEDICATIONS/ALLERGIES   Current Meds  Medication Sig  . acetaminophen (TYLENOL) 500 MG tablet Take 500-1,000 mg by mouth every 8 (eight) hours as  needed (for pain).   [name] ALPRAZolam (XANAX) 0.25 MG tablet Take 0.25 mg by mouth daily as needed.  [name] amphetamine-dextroamphetamine (ADDERALL) 10 MG tablet 1 tablet  . Apple Cider Vinegar 500 MG TABS See admin instructions.  [name] aspirin EC 81 MG tablet Take 81 mg by mouth daily.  [name] atorvastatin (LIPITOR) 80 MG tablet Take 80 mg by mouth daily. Take 1/2 tablet every evening.  . blood glucose meter kit and supplies KIT Dispense based on patient and insurance preference. Use up to four times daily as directed. (ICD-10 E11.9) ONE TOUCH ULTRA # 2 KIT  . Cholecalciferol (VITAMIN D3) 50 MCG (2000 UT) capsule 1 tablet  . dapagliflozin propanediol (FARXIGA) 5 MG TABS tablet Take 5 mg ( 1/2 of 10 mg ) daily.  [name] glucose blood test strip TEST FOUR TIMES DAILY OR AS DIRECTED  . Inulin (FIBER CHOICE FRUITY BITES) 1.5 g CHEW See admin instructions.  . Lancets (ONETOUCH ULTRASOFT) lancets Use as instructed  . Melatonin 10 MG CAPS See admin instructions.  . metFORMIN (GLUCOPHAGE) 850 MG tablet Take 850 mg by mouth every evening.  [name]  Multiple Vitamin (MULTI-VITAMIN) tablet 1 tablet  . omega-3 fish oil (MAXEPA) 1000 MG CAPS capsule 1 capsule  . sildenafil (VIAGRA) 100 MG tablet 1/2-1 tablet as needed  . Zinc 50 MG TABS 1 tablet  . [DISCONTINUED] atorvastatin (LIPITOR) 80 MG tablet Take 1 tablet (80 mg total) by mouth daily at 6 PM.  . [DISCONTINUED] carvedilol (COREG) 25 MG tablet Take 1/2 tablet twice a day  . [DISCONTINUED] losartan (COZAAR) 25 MG tablet Take 1 tablet (25 mg total) by mouth daily.    Not on File  SOCIAL HISTORY/FAMILY HISTORY   Reviewed in Epic:  Pertinent findings:  Social History   Tobacco Use  . Smoking status: Former Smoker    Types: Cigarettes  . Smokeless tobacco: Never Used   Social History   Social History Narrative  . Not on file    OBJCTIVE -PE, EKG, labs   Wt Readings from Last 3 Encounters:  12/01/20 214 lb 3.2 oz (97.2 kg)  01/13/20 217 lb (98.4 kg)  11/30/19 223  lb 12.8 oz (101.5 kg)    Physical Exam: BP (!) 146/78   Pulse 77   Ht $R'5\' 11"'nW$  (1.803 m)   Wt 214 lb 3.2 oz (97.2 kg)   BMI 29.87 kg/m  Physical Exam Constitutional:      General: He is not in acute distress.    Appearance: Normal appearance. He is obese. He is not ill-appearing, toxic-appearing or diaphoretic.     Comments: Healthy-appearing.  Well-groomed.  HENT:     Head: Normocephalic and atraumatic.  Neck:     Vascular: No carotid bruit, hepatojugular reflux or JVD.  Cardiovascular:     Rate and Rhythm: Normal rate and regular rhythm.  No extrasystoles are present.    Chest Wall: PMI is not displaced.     Pulses: Normal pulses.     Heart sounds: No friction rub. No gallop.      Comments: Normal S1 and split S2 Pulmonary:     Effort: Pulmonary effort is normal. No respiratory distress.     Breath sounds: Normal breath sounds.  Chest:     Chest wall: No tenderness.  Musculoskeletal:        General: No swelling. Normal range of motion.     Cervical back: Normal range of motion and neck supple.  Neurological:     General: No focal deficit present.     Mental Status: He is alert and oriented to person, place, and time. Mental status is at baseline.     Motor: No weakness.  Psychiatric:        Mood and Affect: Mood normal.        Behavior: Behavior normal.        Thought Content: Thought content normal.        Judgment: Judgment normal.     Adult ECG Report  Rate: 77 ;  Rhythm: normal sinus rhythm and LBBB with a right axis deviation; LVH by voltage.;   Narrative Interpretation: LBBB is new, (borderline IVCD last EKG)  Recent Labs:   11/08/2020: TC 111, TG 203, HDL 32, LDL 47.  A1c 6.3.  K+ 4.7, Cr 0.94. Lab Results  Component Value Date   CHOL 86 (L) 12/21/2019   HDL 29 (L) 12/21/2019   LDLCALC 27 12/21/2019   TRIG 189 (H) 12/21/2019   CHOLHDL 3.0 12/21/2019   Lab Results  Component Value Date   CREATININE 0.94 12/21/2019   BUN 18 12/21/2019   NA 138  12/21/2019  K 5.1 12/21/2019   CL 104 12/21/2019   CO2 22 12/21/2019   No results found for: TSH  ASSESSMENT/PLAN    Problem List Items Addressed This Visit    Multivessel CAD - 100% LAD (with Diag), 99% mRCA (subtotal occlusio) (Chronic)    Status post three-vessel CABG August 2022.  No further angina since then.  Almost back to baseline.  Doing better with half dose [place].  Plan:  Continue carvedilol, but will reduce dose to 6.25 mg in the morning and 12.5 mg p.m. to allow for more energy level during the day.  Increase losartan to 50 mg daily  Currently on 40 mg atorvastatin, with recent labs showing excellent control of LDL, and mild fatigue symptoms we will try to reduce dose to 20 mg daily.  On half dose of Farxiga  On aspirin, no Plavix.  Okay to hold 5 days preop for surgeries or procedures.      Relevant Medications   sildenafil (VIAGRA) 100 MG tablet   atorvastatin (LIPITOR) 80 MG tablet   carvedilol (COREG) 12.5 MG tablet   losartan (COZAAR) 50 MG tablet   Essential hypertension (Chronic)    Blood pressure little high today.  For now also happens when he back off on the carvedilol dose in the morning and increase losartan.  Reassess with PCP follow-up as well as 18-month follow-up.      Relevant Medications   sildenafil (VIAGRA) 100 MG tablet   atorvastatin (LIPITOR) 80 MG tablet   carvedilol (COREG) 12.5 MG tablet   losartan (COZAAR) 50 MG tablet   Ischemic cardiomyopathy (Chronic)    EF almost back to normal range.  I suspect that left bundle branch block may make EF little bit worse.  Probably related post CABG and anterior ischemic disease.  The absence of any active CHF symptoms, and will think we need to reassess EF.  Plan: Continue carvedilol and losartan with adjustments made accordingly -> Changing carvedilol to 625 mg a.m., 12.5 mg p.m., and increasing losartan to 50 mg daily.      Relevant Medications   sildenafil (VIAGRA) 100 MG tablet    atorvastatin (LIPITOR) 80 MG tablet   carvedilol (COREG) 12.5 MG tablet   losartan (COZAAR) 50 MG tablet   Hyperlipidemia associated with type 2 diabetes mellitus (HCC) - Primary (Chronic)    LDL still well controlled on 40 mg of Lipitor.  Raquel Sarna concerned about memory issues, and would like to potentially reduce statin dose.  Plan: Hold atorvastatin for 2 weeks.  If no changes in memory issues then restart.  If memory issues to resolve, then we will likely convert to atorvastatin. Continue fish oil.  Recheck lipid and chemistry panel in 4 months  Continue Metformin and Farxiga at half dose.      Relevant Medications   atorvastatin (LIPITOR) 80 MG tablet   losartan (COZAAR) 50 MG tablet   Other Relevant Orders   EKG 12-Lead (Completed)   Lipid panel    Other Visit Diagnoses    Coronary artery disease involving native coronary artery of native heart without angina pectoris       Relevant Medications   sildenafil (VIAGRA) 100 MG tablet   atorvastatin (LIPITOR) 80 MG tablet   carvedilol (COREG) 12.5 MG tablet   losartan (COZAAR) 50 MG tablet   Other Relevant Orders   EKG 12-Lead (Completed)   Lipid panel      COVID-19 Education: The signs and symptoms of COVID-19 were discussed with the patient and  how to seek care for testing (follow up with PCP or arrange E-visit).   The importance of social distancing and COVID-19 vaccination was discussed today.  The patient is practicing social distancing & Masking.   I spent a total of 18minutes with the patient spent in direct patient consultation.  Additional time spent with chart review  / charting (studies, outside notes, etc): 10 Total Time: 48 min   Current medicines are reviewed at length with the patient today.  (+/- concerns) n/a  This visit occurred during the SARS-CoV-2 public health emergency.  Safety protocols were in place, including screening questions prior to the visit, additional usage of staff PPE, and extensive  cleaning of exam room while observing appropriate contact time as indicated for disinfecting solutions.  Notice: This dictation was prepared with Dragon dictation along with smaller phrase technology. Any transcriptional errors that result from this process are unintentional and may not be corrected upon review.  Patient Instructions / Medication Changes & Studies & Tests Ordered   Patient Instructions  Medication Instructions:  Increase Losartan to 50 mg daily Coreg ( Carvedilol ) 12.5 mg take 1/2 tablet in am and take 1 tablet in pm Hold Atorvastatin for 2 weeks if no change then restart if memory has improved Call [place] all other medications *If you need a refill on your cardiac medications before your next appointment, please call your pharmacy*   Lab Work: Lipid and Hepatic panels in 4 months Lab order enclosed   Testing/Procedures: None ordered   Follow-Up: At [facility], you and your health needs are our priority.  As part of our continuing mission to provide you with exceptional heart care, we have created designated Provider Care Teams.  These Care Teams include your primary Cardiologist (physician) and Advanced Practice Providers (APPs -  Physician Assistants and Nurse Practitioners) who all work together to provide you with the care you need, when you need it.  We recommend signing up for the patient portal called "MyChart".  Sign up information is provided on this After Visit Summary.  MyChart is used to connect with patients for Virtual Visits (Telemedicine).  Patients are able to view lab/test results, encounter notes, upcoming appointments, etc.  Non-urgent messages can be sent to your provider as well.   To learn more about what you can do with MyChart, go to [email or web address].    Your next appointment:  Monday 05/29/21 at 4:00 pm   The format for your next appointment:  Office   Provider:  Dr.Rivan Siordia   Studies Ordered:   Orders Placed  This Encounter  Procedures  . Lipid panel  . EKG 12-Lead     Glenetta Hew, M.D., M.S. Interventional Cardiologist   Pager # [phone] Phone # [phone] [address] [place], [place] [address]   Thank you for choosing Heartcare at [facility]!!

## 2020-12-01 NOTE — Patient Instructions (Addendum)
Medication Instructions:  Increase Losartan to 50 mg daily Coreg ( Carvedilol ) 12.5 mg take 1/2 tablet in am and take 1 tablet in pm Hold Atorvastatin for 2 weeks if no change then restart if memory has improved Call Dr.Harding Continue all other medications *If you need a refill on your cardiac medications before your next appointment, please call your pharmacy*   Lab Work: Lipid and Hepatic panels in 4 months Lab order enclosed   Testing/Procedures: None ordered   Follow-Up: At [facility], you and your health needs are our priority.  As part of our continuing mission to provide you with exceptional heart care, we have created designated Provider Care Teams.  These Care Teams include your primary Cardiologist (physician) and Advanced Practice Providers (APPs -  Physician Assistants and Nurse Practitioners) who all work together to provide you with the care you need, when you need it.  We recommend signing up for the patient portal called "MyChart".  Sign up information is provided on this After Visit Summary.  MyChart is used to connect with patients for Virtual Visits (Telemedicine).  Patients are able to view lab/test results, encounter notes, upcoming appointments, etc.  Non-urgent messages can be sent to your provider as well.   To learn more about what you can do with MyChart, go to [email or web address].    Your next appointment:  Monday 05/29/21 at 4:00 pm   The format for your next appointment:  Office   Provider:  Dr.Harding

## 2020-12-18 ENCOUNTER — Encounter: Payer: Self-pay | Admitting: Cardiology

## 2020-12-18 DIAGNOSIS — I10 Essential (primary) hypertension: Secondary | ICD-10-CM | POA: Insufficient documentation

## 2020-12-18 NOTE — Assessment & Plan Note (Signed)
EF almost back to normal range.  I suspect that left bundle branch block may make EF little bit worse.  Probably related post CABG and anterior ischemic disease.  The absence of any active CHF symptoms, and will think we need to reassess EF.  Plan: Continue carvedilol and losartan with adjustments made accordingly -> Changing carvedilol to 625 mg a.m., 12.5 mg p.m., and increasing losartan to 50 mg daily.

## 2020-12-18 NOTE — Assessment & Plan Note (Signed)
Status post three-vessel CABG August 2022.  No further angina since then.  Almost back to baseline.  Doing better with half dose [place].  Plan:  Continue carvedilol, but will reduce dose to 6.25 mg in the morning and 12.5 mg p.m. to allow for more energy level during the day.  Increase losartan to 50 mg daily  Currently on 40 mg atorvastatin, with recent labs showing excellent control of LDL, and mild fatigue symptoms we will try to reduce dose to 20 mg daily.  On half dose of Farxiga  On aspirin, no Plavix.  Okay to hold 5 days preop for surgeries or procedures.

## 2020-12-18 NOTE — Assessment & Plan Note (Addendum)
LDL still well controlled on 40 mg of Lipitor.  Irving Burton concerned about memory issues, and would like to potentially reduce statin dose.  Plan: Hold atorvastatin for 2 weeks.  If no changes in memory issues then restart.  If memory issues to resolve, then we will likely convert to atorvastatin. Continue fish oil.  Recheck lipid and chemistry panel in 4 months  Continue Metformin and Farxiga at half dose.

## 2020-12-18 NOTE — Assessment & Plan Note (Signed)
Blood pressure little high today.  For now also happens when he back off on the carvedilol dose in the morning and increase losartan.  Reassess with PCP follow-up as well as 32-month follow-up.

## 2020-12-19 ENCOUNTER — Telehealth: Payer: Self-pay | Admitting: Cardiology

## 2020-12-19 NOTE — Telephone Encounter (Signed)
Pt called in and stated that he was to call and report if the adjust that Dr Herbie Baltimore did on his last visit with the atorvastatin (LIPITOR) 80 MG tablet [[id number]]   has help .  He reports a big improvement   Pt also want to let Dr Herbie Baltimore know that pt A1C had come down 5.9 and he has come off of the metFORMIN (GLUCOPHAGE) 850 MG tablet [[id number]]

## 2020-12-19 NOTE — Telephone Encounter (Signed)
That is great to hear.  Recommendation will be to stop the atorvastatin altogether.  We will replace it with rosuvastatin 20 mg by mouth daily.  Rx: Rosuvastatin 20 mg PO Daily, Disp #90, 3 refills.   Recheck Lipids  &  LFTs in ~4 months.   [name], MD

## 2021-02-08 DIAGNOSIS — I10 Essential (primary) hypertension: Secondary | ICD-10-CM | POA: Diagnosis not present

## 2021-02-08 DIAGNOSIS — E1169 Type 2 diabetes mellitus with other specified complication: Secondary | ICD-10-CM | POA: Diagnosis not present

## 2021-02-08 DIAGNOSIS — K219 Gastro-esophageal reflux disease without esophagitis: Secondary | ICD-10-CM | POA: Diagnosis not present

## 2021-02-08 DIAGNOSIS — I2510 Atherosclerotic heart disease of native coronary artery without angina pectoris: Secondary | ICD-10-CM | POA: Diagnosis not present

## 2021-02-13 MED ORDER — ROSUVASTATIN CALCIUM 10 MG PO TABS: 10.0000 mg | ORAL_TABLET | Freq: Every day | ORAL | 3 refills | Status: DC

## 2021-03-03 DIAGNOSIS — K219 Gastro-esophageal reflux disease without esophagitis: Secondary | ICD-10-CM | POA: Diagnosis not present

## 2021-03-03 DIAGNOSIS — I2510 Atherosclerotic heart disease of native coronary artery without angina pectoris: Secondary | ICD-10-CM | POA: Diagnosis not present

## 2021-03-03 DIAGNOSIS — E1169 Type 2 diabetes mellitus with other specified complication: Secondary | ICD-10-CM | POA: Diagnosis not present

## 2021-03-03 DIAGNOSIS — I10 Essential (primary) hypertension: Secondary | ICD-10-CM | POA: Diagnosis not present

## 2021-05-24 ENCOUNTER — Other Ambulatory Visit (HOSPITAL_COMMUNITY): Payer: Self-pay | Admitting: Family Medicine

## 2021-05-24 DIAGNOSIS — E041 Nontoxic single thyroid nodule: Secondary | ICD-10-CM

## 2021-05-29 ENCOUNTER — Ambulatory Visit: Payer: Medicare Other | Admitting: Cardiology

## 2021-05-29 ENCOUNTER — Encounter: Payer: Self-pay | Admitting: Cardiology

## 2021-05-29 ENCOUNTER — Other Ambulatory Visit: Payer: Self-pay

## 2021-05-29 VITALS — BP 136/68 | HR 70 | Ht 69.5 in | Wt 198.0 lb

## 2021-05-29 DIAGNOSIS — E1169 Type 2 diabetes mellitus with other specified complication: Secondary | ICD-10-CM | POA: Diagnosis not present

## 2021-05-29 DIAGNOSIS — Z951 Presence of aortocoronary bypass graft: Secondary | ICD-10-CM | POA: Diagnosis not present

## 2021-05-29 DIAGNOSIS — I214 Non-ST elevation (NSTEMI) myocardial infarction: Secondary | ICD-10-CM | POA: Diagnosis not present

## 2021-05-29 DIAGNOSIS — E785 Hyperlipidemia, unspecified: Secondary | ICD-10-CM

## 2021-05-29 DIAGNOSIS — G720 Drug-induced myopathy: Secondary | ICD-10-CM

## 2021-05-29 DIAGNOSIS — I25110 Atherosclerotic heart disease of native coronary artery with unstable angina pectoris: Secondary | ICD-10-CM

## 2021-05-29 DIAGNOSIS — E781 Pure hyperglyceridemia: Secondary | ICD-10-CM

## 2021-05-29 DIAGNOSIS — T466X5A Adverse effect of antihyperlipidemic and antiarteriosclerotic drugs, initial encounter: Secondary | ICD-10-CM | POA: Insufficient documentation

## 2021-05-29 MED ORDER — ICOSAPENT ETHYL 1 G PO CAPS: 2.0000 g | ORAL_CAPSULE | Freq: Two times a day (BID) | ORAL | 11 refills | Status: DC

## 2021-05-29 NOTE — Patient Instructions (Signed)
Medication Instructions:    *If you need a refill on your cardiac medications before your next appointment, please call your pharmacy*   Lab Work:  If you have labs (blood work) drawn today and your tests are completely normal, you will receive your results only by: . MyChart Message (if you have MyChart) OR . A paper copy in the mail If you have any lab test that is abnormal or we need to change your treatment, we will call you to review the results.   Testing/Procedures:    Follow-Up: At [facility], you and your health needs are our priority.  As part of our continuing mission to provide you with exceptional heart care, we have created designated Provider Care Teams.  These Care Teams include your primary Cardiologist (physician) and Advanced Practice Providers (APPs -  Physician Assistants and Nurse Practitioners) who all work together to provide you with the care you need, when you need it.     Your next appointment:   6 month(s)  The format for your next appointment:   In Person  Provider:   David Harding, MD   

## 2021-05-29 NOTE — Progress Notes (Signed)
Ekg

## 2021-05-29 NOTE — Progress Notes (Signed)
Primary Care Provider: Lawerance Cruel, MD Cardiologist: Glenetta Hew, MD Electrophysiologist: None  Clinic Note: Chief Complaint  Patient presents with   Follow-up    Doing fairly well.   Coronary Artery Disease    No angina or significant exertional dyspnea.   Hyperlipidemia    Significant symptom relief with statin holiday.  He had issues with poor memory, poor sleep, myalgias and arthralgias.  All resolved with statin holiday and recurred with restarting rosuvastatin at 40 mg.   ===================================  ASSESSMENT/PLAN   Problem List Items Addressed This Visit     Multivessel CAD - 100% LAD (with Diag), 99% mRCA (subtotal occlusio) (Chronic)    Status post CABG x3 in August 2022 for non-STEMI.  EF improved back to baseline normal.  Significant issues with statin-discussed elsewhere.  Plan: Continue current dose of carvedilol 3.125 mg twice daily along with losartan 50 mg (but switched to dinnertime dosing) He had previously been on 12.5 mg twice daily carvedilol which I recently reduced to 6.25 mg twice daily and he decreased himself to 3.125 mg per PCP. Is taking fish oil, but no longer on statin.  His LDL is only 80 off of statin now for several months.  Would like to try Vascepa up which will help his triglycerides. On aspirin.       Relevant Medications   sildenafil (VIAGRA) 50 MG tablet   carvedilol (COREG) 3.125 MG tablet   icosapent Ethyl (VASCEPA) 1 g capsule   Other Relevant Orders   EKG 12-Lead (Completed)   Lipid panel   Hypertriglyceridemia    Will recheck labs in roughly 3 months.  If triglycerides still seem elevated, low threshold to consider Vascepa.       Relevant Medications   sildenafil (VIAGRA) 50 MG tablet   carvedilol (COREG) 3.125 MG tablet   icosapent Ethyl (VASCEPA) 1 g capsule   Other Relevant Orders   Hepatic function panel   Lipid panel   Statin myopathy    Pretty much intolerant of both rosuvastatin and  atorvastatin.  Reassess lipids in roughly 3 months.  If no longer at goal, refer to [facility] for consideration of PCSK9 inhibitor/inclisiran plus or minus Vascepa..       Relevant Medications   icosapent Ethyl (VASCEPA) 1 g capsule   Other Relevant Orders   Hepatic function panel   Lipid panel   NSTEMI (non-ST elevated myocardial infarction) ([place]) (Chronic)    Almost 2 years out from non-STEMI found to have significant CAD with occluded LAD and severe RCA disease-referred for CABG.  Had but reduced EF at that time, resolved after CABG revascularization.  No further anginal symptoms.  On stable regimen.       Relevant Medications   sildenafil (VIAGRA) 50 MG tablet   carvedilol (COREG) 3.125 MG tablet   icosapent Ethyl (VASCEPA) 1 g capsule   Other Relevant Orders   EKG 12-Lead (Completed)   Lipid panel   S/P Off Pump CABG x 3 - Primary (Chronic)   Relevant Orders   EKG 12-Lead (Completed)   Lipid panel   Hyperlipidemia associated with type 2 diabetes mellitus ([place]) (Chronic)    Unfortunately, did not tolerate statins.  This may simply been an issue with him being on high doses, but now has tried both atorvastatin and rosuvastatin.  If symptoms significantly improved with statin holiday and then recurred with restarting rosuvastatin.  He has made significant dietary adjustments and has increased his exercise level leading to weight loss.  Hopefully this will help keep his lipids under control.  Will recheck lipids in roughly 3 months, depending on results, may simply start Vascepa if LDL not that far off and triglycerides still elevated, otherwise will refer to [facility].  Tolerating half dose of Farxiga with excellent A1c.       Relevant Medications   icosapent Ethyl (VASCEPA) 1 g capsule   Other Relevant Orders   Hepatic function panel   Lipid panel    ===================================  HPI:    Anthony Alvarez is a 65 y.o. male with a PMH notable for multivessel  CAD (CABG times 20 July 2019), HTN, HLD and DM-2 who presents today for 22-monthfollow-up.  Anthony Alvarez last seen on December 01, 2020.  He was doing quite well.  More active than he had been.  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 We reduced his daytime dose of carvedilol to 6.25 mg and increase losartan to 50 mg. 2-week statin holiday with plans to potentially convert statin to rosuvastatin  Recent Hospitalizations: None  Reviewed  CV studies:    The following studies were reviewed today: (if available, images/films reviewed: From Epic Chart or Care Everywhere) None:  Interval History:   Anthony Ciulloreturns here for 624-monthollow-up stating that he was feeling really poorly in the beginning of the year with poor memory, poor sleep, muscle aches exertional dyspnea and dizziness.  When we told him to do the statin holiday, he said that after 2 weeks he felt much better.  We try to switch him back to rosuvastatin and the symptoms recurred.  He has been off statin now for 4 and half months and says most of the symptoms have all resolved.  He still gets little short of breath if he overdoes it with exertion but not with routine activity.  He says that he may have mild end of day swelling, but no PND or orthopnea.  Some mild orthostatic dizziness, but does better if he adequately hydrates.  His blood pressures are usually better controlled than listed here.  He showed me and range of blood pressures dating back to August of last year where his pressures were in the 130s over 70s.  Most recently, over the last month or 2, his pressures have been as low as the 110s/ 60s with occasional highs in the 150s but short-lived.   Since he stopped taking a statin, he is really getting back to doing most of his routine activities without too much of an issue.  He is just very reluctant to do  any further statin therapy.  CV Review of Symptoms (Summary) Cardiovascular ROS: positive for -  dyspnea with significant exertion; mild orthostatic dizziness and end of day swelling.  Significant myalgias/arthralgias and memory issues with poor sleep on statin.  Recurred after statin holiday with reinitiation of 40 mg rosuvastatin. negative for - chest pain, irregular heartbeat, orthopnea, palpitations, paroxysmal nocturnal dyspnea, rapid heart rate, shortness of breath, or lightheadedness, dizziness or syncope/near syncope or TIA/amaurosis fugax or claudication.  REVIEWED OF SYSTEMS   Review of Systems  Constitutional:  Positive for weight loss (About 16 pounds since December). Negative for malaise/fatigue (Pretty much resolved after stopping statin.).  HENT:  Negative for congestion and nosebleeds.   Respiratory:  Negative for cough and shortness of breath.   Cardiovascular:  Negative for chest pain.  Gastrointestinal:  Negative for blood in stool.  Genitourinary:  Negative for hematuria.  Musculoskeletal:  Positive for joint pain (Arthritis pains, but significant arthralgias also resolved off of statin.) and myalgias (Resolved since being off of statin.).  Neurological:  Positive for dizziness (With rapid change in position/orthostasis.). Negative for focal weakness.  Endo/Heme/Allergies:  Positive for environmental allergies.  Psychiatric/Behavioral:  Positive for memory loss (Resolved after stopping statin.). Negative for depression. The patient has insomnia (Resolved after stopping statin.). The patient is not nervous/anxious.    I have reviewed and (if needed) personally updated the patient's problem list, medications, allergies, past medical and surgical history, social and family history.   PAST MEDICAL HISTORY   Past Medical History:  Diagnosis Date   HLD (hyperlipidemia)    Hyperlipidemia associated with type 2 diabetes mellitus ([place]) 11/30/2019   Ischemic cardiomyopathy  11/30/2019   Multivessel CAD - 100% LAD (with Diag), 99% mRCA (subtotal occlusio) 08/12/2019   NSTEMI (non-ST elevated myocardial infarction) ([place]) 08/10/2019   S/P Off Pump CABG x 3 08/13/2019   LIMA to LAD RSVG to PDA RSVG to PLVB    PAST SURGICAL HISTORY   Past Surgical History:  Procedure Laterality Date   CORONARY ARTERY BYPASS GRAFT N/A 08/13/2019   Procedure: OFF PUMP CORONARY ARTERY BYPASS GRAFTING (CABG) x 2 WITH ENDOSCOPIC HARVESTING OF RIGHT GREATER SAPHENOUS VEIN;  Surgeon: [name], MD;  Location: [facility];  Service: Open Heart Surgery: LIMA-LAD, SVG-PDA, SVG-PL.   LEFT HEART CATH AND CORONARY ANGIOGRAPHY N/A 08/11/2019   Procedure: LEFT HEART CATH AND CORONARY ANGIOGRAPHY;  Surgeon: [name], [name], MD;  Location: [facility];  Service: Cardiovascular;  p-mLAD 100%, mRCA thrombotic 95%, rPAV 95%.  EF mod reduced, ~40%. EDP 24 mmHg --> CABG referral   TEE WITHOUT CARDIOVERSION N/A 08/13/2019   Procedure: TRANSESOPHAGEAL ECHOCARDIOGRAM (TEE);  Surgeon: [name], MD;  Location: [facility];  Service: Open Heart Surgery;;;EF 30-40%.  Apical akinesis.  Trace AI.  Mild MR.  Normal RV.    TRANSTHORACIC ECHOCARDIOGRAM  12/21/2019   EF improved to 45 and 50%.  Mild LVH.  Moderate HK of the mid anteroapical, inferoapical and inferolateral wall.  Septal motion consistent with LBBB.  Only GR 1 DD.  Moderate reduced RV function.  Normal atrial size.  Aortic root roughly 39 mm   TRANSTHORACIC ECHOCARDIOGRAM  08/11/2019   (non-STEMI) EF 35%.  Apical septal akinesis, apical akinesis.  Mid apical inferior akinesis and apical lateral akinesis.  GRIII DD.  Trivial MR.  Mild aortic root dilation-4.2 mL.     Cardiac Cath 08/11/2019: p-mLAD 100%, mRCA thrombotic 95%, rPAV 95%. Moderately reduced LV Fx & moderately elevated LvEDP (EF ~40 %, EDP 24 mmHg)  CABG x 3 08/13/2019 (Dr. Kipp Brood):, LIMA-LAD, SVG-PDA, SVG-PL TEE 08/13/2019 EF 30-40%.  Apical akinesis.  Trace AI.  Mild MR.   Normal RV   Immunization History  Administered Date(s) Administered   Tdap 06/02/2016    MEDICATIONS/ALLERGIES   Current Meds  Medication Sig   acetaminophen (TYLENOL) 500 MG tablet Take 500-1,000 mg by mouth every 8 (eight) hours as needed (for pain).    ALPRAZolam (XANAX) 0.25 MG tablet Take 0.25 mg by mouth daily as needed.   amphetamine-dextroamphetamine (ADDERALL) 10 MG tablet 1 tablet   Apple Cider Vinegar 500 MG TABS See admin instructions.   aspirin EC 81 MG tablet Take 81 mg by mouth daily.   blood glucose meter kit and supplies KIT Dispense based on patient and insurance preference.  Use up to four times daily as directed. (ICD-10 E11.9) ONE TOUCH ULTRA # 2 KIT   carvedilol (COREG) 3.125 MG tablet Take 3.125 mg by mouth in the morning and at bedtime.   Cholecalciferol (VITAMIN D3) 50 MCG (2000 UT) capsule 1 tablet   dapagliflozin propanediol (FARXIGA) 5 MG TABS tablet Take 5 mg ( 1/2 of 10 mg ) daily.   glucose blood test strip TEST FOUR TIMES DAILY OR AS DIRECTED   icosapent Ethyl (VASCEPA) 1 g capsule Take 2 capsules (2 g total) by mouth 2 (two) times daily.   Inulin (FIBER CHOICE FRUITY BITES) 1.5 g CHEW See admin instructions.   Lancets (ONETOUCH ULTRASOFT) lancets Use as instructed   Melatonin 10 MG CAPS See admin instructions.   Multiple Vitamin (MULTI-VITAMIN) tablet 1 tablet   omega-3 fish oil (MAXEPA) 1000 MG CAPS capsule 1 capsule   sildenafil (VIAGRA) 50 MG tablet Take 25-50 mg by mouth daily as needed.   terbinafine (LAMISIL) 250 MG tablet Take by mouth.   Zinc 50 MG TABS 1 tablet   [DISCONTINUED] ALPRAZolam (XANAX) 0.25 MG tablet 1 tablet as needed   [DISCONTINUED] carvedilol (COREG) 12.5 MG tablet Take 1/2 tablet in am and take 1 tablet in pm   [DISCONTINUED] metFORMIN (GLUCOPHAGE) 850 MG tablet Take 850 mg by mouth every evening.   [DISCONTINUED] sildenafil (VIAGRA) 100 MG tablet 1/2-1 tablet as needed    Allergies  Allergen Reactions   Atorvastatin Other  (See Comments)    Memory deficit, fatigue, myalgias, insomnia; similar symptoms with rosuvastatin.    SOCIAL HISTORY/FAMILY HISTORY   Reviewed in Epic:  Pertinent findings:  Social History   Tobacco Use   Smoking status: Former    Pack years: 0.00    Types: Cigarettes   Smokeless tobacco: Never   Social History   Social History Narrative   Not on file    OBJCTIVE -PE, EKG, labs   Wt Readings from Last 3 Encounters:  05/29/21 198 lb (89.8 kg)  12/01/20 214 lb 3.2 oz (97.2 kg)  01/13/20 217 lb (98.4 kg)    Physical Exam: BP 136/68   Pulse 70   Ht 5' 9.5" (1.765 m)   Wt 198 lb (89.8 kg)   SpO2 97%   BMI 28.82 kg/m  Physical Exam Vitals reviewed.  Constitutional:      General: He is not in acute distress.    Appearance: Normal appearance. He is normal weight. He is not toxic-appearing.     Comments: Notable weight loss.  Well-nourished, well-groomed.  Healthy-appearing.  HENT:     Head: Normocephalic and atraumatic.  Neck:     Vascular: No carotid bruit or JVD.  Cardiovascular:     Rate and Rhythm: Normal rate and regular rhythm. No extrasystoles are present.    Chest Wall: PMI is not displaced.     Pulses: Normal pulses.     Heart sounds: Heart sounds are distant. No murmur heard.   No friction rub. No gallop.     Comments: Normal S1 with split S2. Pulmonary:     Effort: Pulmonary effort is normal. No respiratory distress.     Breath sounds: Normal breath sounds. No wheezing, rhonchi or rales.  Chest:     Chest wall: No tenderness.  Musculoskeletal:        General: No swelling. Normal range of motion.     Cervical back: Normal range of motion and neck supple.  Skin:    General: Skin is warm and dry.  Neurological:     General: No focal deficit present.     Mental Status: He is alert and oriented to person, place, and time.  Psychiatric:        Mood and Affect: Mood normal.        Behavior: Behavior normal.        Thought Content: Thought content  normal.        Judgment: Judgment normal.    Adult ECG Report  Rate: 70 ;  Rhythm: normal sinus rhythm and LAA, NSIVCD (~LBBB), ~ Lat MI - Age indeterminate (repol changes) ; normal axis, intervals & durations.   Narrative Interpretation: AIVCD new - otherwise better.   Recent Labs:   05/11/2021: TC 153, TG 278.  HDL 28, at LDL 80.  A1c 5.2. Cr 0.88, K+ 4.9. Lab Results  Component Value Date   CHOL 86 (L) 12/21/2019   HDL 29 (L) 12/21/2019   LDLCALC 27 12/21/2019   TRIG 189 (H) 12/21/2019   CHOLHDL 3.0 12/21/2019   Lab Results  Component Value Date   CREATININE 0.94 12/21/2019   BUN 18 12/21/2019   NA 138 12/21/2019   K 5.1 12/21/2019   CL 104 12/21/2019   CO2 22 12/21/2019   CBC Latest Ref Rng & Units 08/16/2019 08/15/2019 08/14/2019  WBC 4.0 - 10.5 K/uL 15.2(H) 15.0(H) 17.3(H)  Hemoglobin 13.0 - 17.0 g/dL 12.7(L) 12.5(L) 12.7(L)  Hematocrit 39.0 - 52.0 % 36.5(L) 37.0(L) 37.8(L)  Platelets 150 - 400 K/uL 211 204 212    No results found for: TSH  ==================================================  COVID-19 Education: The signs and symptoms of COVID-19 were discussed with the patient and how to seek care for testing (follow up with PCP or arrange E-visit).    I spent a total of 43 minutes with the patient spent in direct patient consultation.  Additional time spent with chart review  / charting (studies, outside notes, etc): 13 min Total Time: 56 min Current medicines are reviewed at length with the patient today.  (+/- concerns) noted improved Energy level, beter memory & less aching; poor sleep - same Sx with Crestor  This visit occurred during the SARS-CoV-2 public health emergency.  Safety protocols were in place, including screening questions prior to the visit, additional usage of staff PPE, and extensive cleaning of exam room while observing appropriate contact time as indicated for disinfecting solutions.  Notice: This dictation was prepared with Dragon dictation  along with smaller phrase technology. Any transcriptional errors that result from this process are unintentional and may not be corrected upon review.  Patient Instructions / Medication Changes & Studies & Tests Ordered   Patient Instructions  Medication Instructions:    *If you need a refill on your cardiac medications before your next appointment, please call your pharmacy*   Lab Work:  If you have labs (blood work) drawn today and your tests are completely normal, you will receive your results only by: [place] (if you have MyChart) OR A paper copy in the mail If you have any lab test that is abnormal or we need to change your treatment, we will call you to review the results.   Testing/Procedures:    Follow-Up: At [facility], you and your health needs are our priority.  As part of our continuing mission to provide you with exceptional heart care, we have created designated Provider Care Teams.  These Care Teams include your primary Cardiologist (physician) and Advanced Practice Providers (APPs -  Physician Assistants and Nurse Practitioners)  who all work together to provide you with the care you need, when you need it.     Your next appointment:   6 month(s)  The format for your next appointment:   In Person  Provider:   Glenetta Hew, MD     Studies Ordered:   Orders Placed This Encounter  Procedures   Hepatic function panel   Lipid panel   EKG 12-Lead      Glenetta Hew, M.D., M.S. Interventional Cardiologist   Pager # [phone] Phone # [phone] [address] [place], [place] [address]   Thank you for choosing Heartcare at [facility]!!

## 2021-05-30 DIAGNOSIS — R972 Elevated prostate specific antigen [PSA]: Secondary | ICD-10-CM | POA: Insufficient documentation

## 2021-06-04 ENCOUNTER — Encounter: Payer: Self-pay | Admitting: Cardiology

## 2021-06-04 NOTE — Assessment & Plan Note (Signed)
Will recheck labs in roughly 3 months.  If triglycerides still seem elevated, low threshold to consider Vascepa.

## 2021-06-04 NOTE — Assessment & Plan Note (Addendum)
Unfortunately, did not tolerate statins.  This may simply been an issue with him being on high doses, but now has tried both atorvastatin and rosuvastatin.  If symptoms significantly improved with statin holiday and then recurred with restarting rosuvastatin.  He has made significant dietary adjustments and has increased his exercise level leading to weight loss.  Hopefully this will help keep his lipids under control.  Will recheck lipids in roughly 3 months, depending on results, may simply start Vascepa if LDL not that far off and triglycerides still elevated, otherwise will refer to [facility].  Tolerating half dose of Farxiga with excellent A1c.

## 2021-06-04 NOTE — Assessment & Plan Note (Signed)
Almost 2 years out from non-STEMI found to have significant CAD with occluded LAD and severe RCA disease-referred for CABG.  Had but reduced EF at that time, resolved after CABG revascularization.  No further anginal symptoms.  On stable regimen.

## 2021-06-04 NOTE — Assessment & Plan Note (Signed)
Status post CABG x3 in August 2022 for non-STEMI.  EF improved back to baseline normal.  Significant issues with statin-discussed elsewhere.  Plan:  Continue current dose of carvedilol 3.125 mg twice daily along with losartan 50 mg (but switched to dinnertime dosing)  He had previously been on 12.5 mg twice daily carvedilol which I recently reduced to 6.25 mg twice daily and he decreased himself to 3.125 mg per PCP.  Is taking fish oil, but no longer on statin.  His LDL is only 80 off of statin now for several months.  Would like to try Vascepa up which will help his triglycerides.  On aspirin.

## 2021-06-04 NOTE — Assessment & Plan Note (Signed)
Pretty much intolerant of both rosuvastatin and atorvastatin.  Reassess lipids in roughly 3 months.  If no longer at goal, refer to [facility] for consideration of PCSK9 inhibitor/inclisiran plus or minus Vascepa.[name]

## 2021-06-05 ENCOUNTER — Ambulatory Visit (HOSPITAL_COMMUNITY)
Admission: RE | Admit: 2021-06-05 | Discharge: 2021-06-05 | Disposition: A | Discharge status: Home / Self Care | Payer: Medicare Other | Source: Ambulatory Visit | Attending: Family Medicine | Admitting: Family Medicine

## 2021-06-05 ENCOUNTER — Other Ambulatory Visit: Payer: Self-pay

## 2021-06-05 DIAGNOSIS — E041 Nontoxic single thyroid nodule: Secondary | ICD-10-CM

## 2021-08-25 LAB — HEPATIC FUNCTION PANEL
ALT: 16 IU/L (ref 0–44)
AST: 16 IU/L (ref 0–40)
Albumin: 4.6 g/dL (ref 3.8–4.8)
Alkaline Phosphatase: 57 IU/L (ref 44–121)
Bilirubin Total: 1 mg/dL (ref 0.0–1.2)
Bilirubin, Direct: 0.18 mg/dL (ref 0.00–0.40)
Total Protein: 6.6 g/dL (ref 6.0–8.5)

## 2021-08-25 LAB — LIPID PANEL
Chol/HDL Ratio: 10.6 ratio — ABNORMAL HIGH (ref 0.0–5.0)
Cholesterol, Total: 297 mg/dL — ABNORMAL HIGH (ref 100–199)
HDL: 28 mg/dL — ABNORMAL LOW (ref >39)
LDL Chol Calc (NIH): 211 mg/dL — ABNORMAL HIGH (ref 0–99)
Triglycerides: 283 mg/dL — ABNORMAL HIGH (ref 0–149)
VLDL Cholesterol Cal: 58 mg/dL — ABNORMAL HIGH (ref 5–40)

## 2021-08-31 ENCOUNTER — Telehealth: Payer: Self-pay

## 2021-08-31 NOTE — Telephone Encounter (Signed)
Lmomed the pt that we need to go over the results and schedule an appt with lipid clinic. Awaiting callback.

## 2021-09-01 ENCOUNTER — Telehealth: Payer: Self-pay

## 2021-09-01 NOTE — Telephone Encounter (Signed)
Patient returning your call 9/16.  Please call.  Thank you.

## 2021-09-01 NOTE — Telephone Encounter (Signed)
Patient returning call to discuss results and make Lipid appt

## 2021-09-04 NOTE — Telephone Encounter (Signed)
Results given to pt by phone and scheduled lipid clinic appt w/pharmd and pt voiced understanding 

## 2021-09-04 NOTE — Telephone Encounter (Signed)
Results given to pt by phone and scheduled lipid clinic appt w/pharmd and pt voiced understanding

## 2021-09-04 NOTE — Telephone Encounter (Signed)
Lmom for pt to call me back to discuss scheduling lipid appt and results of lab

## 2021-09-21 ENCOUNTER — Ambulatory Visit (INDEPENDENT_AMBULATORY_CARE_PROVIDER_SITE_OTHER): Payer: Medicare Other | Admitting: Pharmacist

## 2021-09-21 ENCOUNTER — Telehealth: Payer: Self-pay | Admitting: Pharmacist

## 2021-09-21 ENCOUNTER — Other Ambulatory Visit: Payer: Self-pay

## 2021-09-21 VITALS — BP 124/86 | HR 88 | Resp 16 | Ht 70.0 in | Wt 199.0 lb

## 2021-09-21 DIAGNOSIS — T466X5A Adverse effect of antihyperlipidemic and antiarteriosclerotic drugs, initial encounter: Secondary | ICD-10-CM | POA: Diagnosis not present

## 2021-09-21 DIAGNOSIS — E785 Hyperlipidemia, unspecified: Secondary | ICD-10-CM

## 2021-09-21 DIAGNOSIS — I25110 Atherosclerotic heart disease of native coronary artery with unstable angina pectoris: Secondary | ICD-10-CM

## 2021-09-21 DIAGNOSIS — I214 Non-ST elevation (NSTEMI) myocardial infarction: Secondary | ICD-10-CM | POA: Diagnosis not present

## 2021-09-21 DIAGNOSIS — E1169 Type 2 diabetes mellitus with other specified complication: Secondary | ICD-10-CM

## 2021-09-21 MED ORDER — REPATHA SURECLICK 140 MG/ML ~~LOC~~ SOAJ: 140.0000 mg | SUBCUTANEOUS | 11 refills | Status: DC

## 2021-09-21 NOTE — Telephone Encounter (Signed)
Please complete prior authorization for: ? ?Name of medication, dose, and frequency Repatha 140mg sq q 14 days ? ?Lab Orders Requested? yes ? ?Which labs? Lipid panel ? ?Estimated date for labs to be scheduled 2-3 months ? ?Does patient need activated copay card? no  ?

## 2021-09-21 NOTE — Patient Instructions (Addendum)
It was nice meeting you today!  We would like to decrease your LDL to less than 55 if possible  We would like to start a new medication called Repatha which you will inject once every 2 weeks  I will complete the prior authorization and call you when it is approved  After you start we will recheck your cholesterol levels in about 2-3 months  Please call with any questions!  Laural Golden, PharmD, BCACP, CDCES, CPP [facility] [address] [place], [place] [address] Phone: [phone]; Fax: [phone] 09/21/2021 10:53 AM

## 2021-09-21 NOTE — Telephone Encounter (Signed)
Called and lmomed the pt that the repatha was approved, rx sent, pt instructed to call back if unaffordable and to comple fasting labs post 4th dose

## 2021-09-21 NOTE — Telephone Encounter (Signed)
Anthony Alvarez (Key: [id number]) repatha 140mg  sent to [facility] Lipid panel ordered and released

## 2021-09-21 NOTE — Progress Notes (Signed)
Patient ID: Anthony Alvarez                 DOB: [birth year]                    MRN: [id number]     HPI: Anthony Alvarez is a 65 y.o. male patient referred to lipid clinic by Dr Ellyn Hack. PMH is significant for NSTEMI, CAD, HTN, DM, CABG, and statin myopathy. Also experienced memory loss while on statins.  Patient presents today with wife.  Reports he could barely move while taking statins.  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  LDL was well controlled while on atorvastatin and rosuvstatin however.    After surgery wife and he dramatically changed their diets.  Has cut out carbohydrates.  A1c has reduced to 5.2 and his blood pressure is better controlled. Reports a 30# weight loss. However LDL has not reduced.  Does not know if he has a family history of CAD but reports mother was an insulin dependent diabetic.    Triglycerides elevated as well, however fish oil d/c recently due to lethargy.  Current Medications: n/a Intolerances: Rosuvastatin, Atorvastatin, Vascepa Risk Factors: NSTEMI, CABG, HTN, DM, HLD, CABG LDL goal: <55  Labs: TC 297, Trigs 283, HDL 28, LDL 211 - 08/24/21 (not on any therapy)  Past Medical History:  Diagnosis Date   HLD (hyperlipidemia)    Hyperlipidemia associated with type 2 diabetes mellitus ([place]) 11/30/2019   Ischemic cardiomyopathy 11/30/2019   Multivessel CAD - 100% LAD (with Diag), 99% mRCA (subtotal occlusio) 08/12/2019   NSTEMI (non-ST elevated myocardial infarction) ([place]) 08/10/2019   S/P Off Pump CABG x 3 08/13/2019   LIMA to LAD RSVG to PDA RSVG to PLVB    Current Outpatient Medications on File Prior to Visit  Medication Sig Dispense Refill   acetaminophen (TYLENOL) 500 MG tablet Take 500-1,000 mg by mouth every 8 (eight) hours as needed (for pain).      ALPRAZolam (XANAX) 0.25 MG tablet Take 0.25 mg by mouth daily as needed.     amphetamine-dextroamphetamine (ADDERALL)  10 MG tablet 1 tablet     Apple Cider Vinegar 500 MG TABS See admin instructions.     aspirin EC 81 MG tablet Take 81 mg by mouth daily.     blood glucose meter kit and supplies KIT Dispense based on patient and insurance preference. Use up to four times daily as directed. (ICD-10 E11.9) ONE TOUCH ULTRA # 2 KIT 1 each 0   carvedilol (COREG) 3.125 MG tablet Take 3.125 mg by mouth in the morning and at bedtime.     Cholecalciferol (VITAMIN D3) 50 MCG (2000 UT) capsule 1 tablet     dapagliflozin propanediol (FARXIGA) 5 MG TABS tablet Take 5 mg ( 1/2 of 10 mg ) daily. 35 tablet 0   glucose blood test strip TEST FOUR TIMES DAILY OR AS DIRECTED 100 each 1   icosapent Ethyl (VASCEPA) 1 g capsule Take 2 capsules (2 g total) by mouth 2 (two) times daily. 120 capsule 11   Inulin (FIBER CHOICE FRUITY BITES) 1.5 g CHEW See admin instructions.     Lancets (ONETOUCH ULTRASOFT) lancets Use as instructed 100 each 1   losartan (COZAAR) 50 MG tablet Take 1 tablet (50 mg total) by mouth daily. 90 tablet 3   Melatonin 10 MG CAPS See admin instructions.     Multiple Vitamin (  MULTI-VITAMIN) tablet 1 tablet     omega-3 fish oil (MAXEPA) 1000 MG CAPS capsule 1 capsule     sildenafil (VIAGRA) 50 MG tablet Take 25-50 mg by mouth daily as needed.     terbinafine (LAMISIL) 250 MG tablet Take by mouth.     Zinc 50 MG TABS 1 tablet     No current facility-administered medications on file prior to visit.    Allergies  Allergen Reactions   Atorvastatin Other (See Comments)    Memory deficit, fatigue, myalgias, insomnia; similar symptoms with rosuvastatin.   Rosuvastatin     Other reaction(s): Other (See Comments)    Assessment/Plan:  1. Hyperlipidemia - LDL 211 which is above goal of <55.  Aggressive goal selected due to patients DM and history of NSTEMI and CABG.  Unfortunately patient is intlerant to all intensities of statin medications.  Using [place] [employer] [place], educated patient on mechanism of action, storage,  site selection, administration, and possible side effects.  Patient was able to demonstrate use in room.  Will complete PA and contact patient.  Will update lipid panel in 2-3 months.  Karren Cobble, PharmD, BCACP, [place], [place] [address], [place], [place] [address] Phone: [phone]; Fax: [phone] 09/21/2021 4:19 PM

## 2021-09-21 NOTE — Addendum Note (Signed)
Addended by: Eather Colas on: 09/21/2021 04:58 PM   Modules accepted: Orders

## 2021-11-07 ENCOUNTER — Emergency Department (HOSPITAL_COMMUNITY)
Admission: EM | Admit: 2021-11-07 | Discharge: 2021-11-07 | Disposition: A | Discharge status: Home / Self Care | Payer: Medicare Other | Attending: Emergency Medicine | Admitting: Emergency Medicine

## 2021-11-07 ENCOUNTER — Emergency Department (HOSPITAL_COMMUNITY): Payer: Medicare Other

## 2021-11-07 ENCOUNTER — Other Ambulatory Visit: Payer: Self-pay

## 2021-11-07 DIAGNOSIS — I2510 Atherosclerotic heart disease of native coronary artery without angina pectoris: Secondary | ICD-10-CM | POA: Insufficient documentation

## 2021-11-07 DIAGNOSIS — E860 Dehydration: Secondary | ICD-10-CM | POA: Insufficient documentation

## 2021-11-07 DIAGNOSIS — J101 Influenza due to other identified influenza virus with other respiratory manifestations: Secondary | ICD-10-CM | POA: Insufficient documentation

## 2021-11-07 DIAGNOSIS — M791 Myalgia, unspecified site: Secondary | ICD-10-CM | POA: Diagnosis present

## 2021-11-07 DIAGNOSIS — Z794 Long term (current) use of insulin: Secondary | ICD-10-CM | POA: Diagnosis not present

## 2021-11-07 DIAGNOSIS — Z79899 Other long term (current) drug therapy: Secondary | ICD-10-CM | POA: Diagnosis not present

## 2021-11-07 DIAGNOSIS — Z20822 Contact with and (suspected) exposure to covid-19: Secondary | ICD-10-CM | POA: Diagnosis not present

## 2021-11-07 DIAGNOSIS — Z87891 Personal history of nicotine dependence: Secondary | ICD-10-CM | POA: Insufficient documentation

## 2021-11-07 DIAGNOSIS — R000 Tachycardia, unspecified: Secondary | ICD-10-CM | POA: Diagnosis not present

## 2021-11-07 DIAGNOSIS — E119 Type 2 diabetes mellitus without complications: Secondary | ICD-10-CM | POA: Diagnosis not present

## 2021-11-07 DIAGNOSIS — Z7982 Long term (current) use of aspirin: Secondary | ICD-10-CM | POA: Diagnosis not present

## 2021-11-07 DIAGNOSIS — R0600 Dyspnea, unspecified: Secondary | ICD-10-CM | POA: Diagnosis not present

## 2021-11-07 DIAGNOSIS — J3489 Other specified disorders of nose and nasal sinuses: Secondary | ICD-10-CM | POA: Diagnosis not present

## 2021-11-07 DIAGNOSIS — I10 Essential (primary) hypertension: Secondary | ICD-10-CM | POA: Insufficient documentation

## 2021-11-07 LAB — RESP PANEL BY RT-PCR (FLU A&B, COVID) ARPGX2
Influenza A by PCR: POSITIVE — AB
Influenza B by PCR: NEGATIVE
SARS Coronavirus 2 by RT PCR: NEGATIVE

## 2021-11-07 LAB — CBC WITH DIFFERENTIAL/PLATELET
Abs Immature Granulocytes: 0.02 10*3/uL (ref 0.00–0.07)
Basophils Absolute: 0 10*3/uL (ref 0.0–0.1)
Basophils Relative: 1 %
Eosinophils Absolute: 0 10*3/uL (ref 0.0–0.5)
Eosinophils Relative: 0 %
HCT: 49.7 % (ref 39.0–52.0)
Hemoglobin: 16.9 g/dL (ref 13.0–17.0)
Immature Granulocytes: 0 %
Lymphocytes Relative: 7 %
Lymphs Abs: 0.6 10*3/uL — ABNORMAL LOW (ref 0.7–4.0)
MCH: 31 pg (ref 26.0–34.0)
MCHC: 34 g/dL (ref 30.0–36.0)
MCV: 91.2 fL (ref 80.0–100.0)
Monocytes Absolute: 0.7 10*3/uL (ref 0.1–1.0)
Monocytes Relative: 8 %
Neutro Abs: 7.1 10*3/uL (ref 1.7–7.7)
Neutrophils Relative %: 84 %
Platelets: 228 10*3/uL (ref 150–400)
RBC: 5.45 MIL/uL (ref 4.22–5.81)
RDW: 13.5 % (ref 11.5–15.5)
WBC: 8.5 10*3/uL (ref 4.0–10.5)
nRBC: 0 % (ref 0.0–0.2)

## 2021-11-07 LAB — BASIC METABOLIC PANEL
Anion gap: 11 (ref 5–15)
BUN: 11 mg/dL (ref 8–23)
CO2: 21 mmol/L — ABNORMAL LOW (ref 22–32)
Calcium: 9.2 mg/dL (ref 8.9–10.3)
Chloride: 105 mmol/L (ref 98–111)
Creatinine, Ser: 1.02 mg/dL (ref 0.61–1.24)
GFR, Estimated: >60 mL/min (ref >60)
Glucose, Bld: 94 mg/dL (ref 70–99)
Potassium: 4 mmol/L (ref 3.5–5.1)
Sodium: 137 mmol/L (ref 135–145)

## 2021-11-07 LAB — TROPONIN I (HIGH SENSITIVITY)
Troponin I (High Sensitivity): 18 ng/L — ABNORMAL HIGH (ref <18)
Troponin I (High Sensitivity): 19 ng/L — ABNORMAL HIGH (ref <18)

## 2021-11-07 LAB — BRAIN NATRIURETIC PEPTIDE: B Natriuretic Peptide: 585.3 pg/mL — ABNORMAL HIGH (ref 0.0–100.0)

## 2021-11-07 MED ORDER — OSELTAMIVIR PHOSPHATE 75 MG PO CAPS: 75.0000 mg | ORAL_CAPSULE | Freq: Two times a day (BID) | ORAL | 0 refills | Status: DC

## 2021-11-07 MED ORDER — IOHEXOL 350 MG/ML SOLN
80.0000 mL | Freq: Once | INTRAVENOUS | Status: AC | PRN
  Administered 2021-11-07: 80 mL via INTRAVENOUS

## 2021-11-07 MED ORDER — SODIUM CHLORIDE 0.9 % IV BOLUS
1000.0000 mL | Freq: Once | INTRAVENOUS | Status: AC
  Administered 2021-11-07: 1000 mL via INTRAVENOUS

## 2021-11-07 NOTE — ED Triage Notes (Signed)
PT BIB [facility] from [facility] for SOB/Weakness.  Pt gets a Repatha shot for Cholesterol q2weeks.  It normally makes him feel bad for a couple of days after, but he feels worse than normal this time.  He went to UC to get checked out and they decided he should come get checked out.  PT does have baseline tracheal deviation d/t goider.

## 2021-11-07 NOTE — ED Notes (Signed)
DC instructions reviewed with pt. PT verbalized understanding. PT DC °

## 2021-11-07 NOTE — ED Notes (Signed)
Pt ambulated well w/o complications

## 2021-11-07 NOTE — Discharge Instructions (Signed)
You are positive for flu A.  I have sent in Tamiflu to your pharmacy.  I also received IV fluids in the emergency room.  However your heart rate remains elevated.  Your blood work was reassuring against heart attack, or an infection.  Given your elevated heart rate, your age, your medical history, and your fast breathing we did discuss admission into the hospital for observation.  However you decided you would rather recover at home.  You did ambulate around the emergency room without drop in your oxygen saturation.  If at anytime while you are at home recovering you have worsening in your breathing, you develop chest pain, develop fever please do not hesitate to return to the emergency room.  Otherwise I do want you to call your primary care provider tomorrow to schedule a follow-up appointment.

## 2021-11-07 NOTE — ED Provider Notes (Signed)
[facility] EM[facility]GENCY DE[facility]MENT Provider Note   CSN: [id number] Arrival date & time: 11/07/21  1626     History No chief complaint on file.   Anthony Alvarez is a 65 y.o. male.  65 year old male past medical history of CAD, hypertension, hyperlipidemia, diabetes presents today for evaluation of fatigue, body aches, rhinorrhea of several day duration that worsened today following his Repatha injection yesterday.  Patient reports he was recently transition to [place] and yesterday was his third dose.  Patient reports typically after his Repatha injection he has 1 day of fatigue and similar symptoms but they typically do not last this long.  Since this morning he also endorses nausea and lack of hydration.  He also endorses dyspnea on exertion.  He denies chest pain, abdominal pain/distention, orthopnea, PND, peripheral edema, or fever.  The history is provided by the patient. No language interpreter was used.      Past Medical History:  Diagnosis Date   HLD (hyperlipidemia)    Hyperlipidemia associated with type 2 diabetes mellitus ([place]) 11/30/2019   Ischemic cardiomyopathy 11/30/2019   Multivessel CAD - 100% LAD (with Diag), 99% mRCA (subtotal occlusio) 08/12/2019   NSTEMI (non-ST elevated myocardial infarction) ([place]) 08/10/2019   S/P Off Pump CABG x 3 08/13/2019   LIMA to LAD RSVG to PDA RSVG to PLVB    Patient Active Problem List   Diagnosis Date Noted   Adverse reaction to statin medication 09/21/2021   Elevated prostate specific antigen (PSA) 05/30/2021   Hypertriglyceridemia 05/29/2021   Statin myopathy 05/29/2021   Essential hypertension 12/18/2020   Ischemic cardiomyopathy 11/30/2019   Diabetes mellitus type II, non insulin dependent ([place]) 11/30/2019   Hyperlipidemia associated with type 2 diabetes mellitus ([place]) 11/30/2019   S/P Off Pump CABG x 3 08/13/2019   Multivessel CAD - 100% LAD (with Diag), 99% mRCA (subtotal occlusio) 08/12/2019   NSTEMI  (non-ST elevated myocardial infarction) ([place]) 08/10/2019    Past Surgical History:  Procedure Laterality Date   CORONARY ARTERY BYPASS GRAFT N/A 08/13/2019   Procedure: OFF PUMP CORONARY ARTERY BYPASS GRAFTING (CABG) x 2 WITH ENDOSCOPIC HARVESTING OF RIGHT GREATER SAPHENOUS VEIN;  Surgeon: [name], MD;  Location: [facility] OR;  Service: Open Heart Surgery: LIMA-LAD, SVG-PDA, SVG-PL.   LEFT HEART CATH AND CORONARY ANGIOGRAPHY N/A 08/11/2019   Procedure: LEFT HEART CATH AND CORONARY ANGIOGRAPHY;  Surgeon: [name], [name], MD;  Location: [place] [facility];  Service: Cardiovascular;  p-mLAD 100%, mRCA thrombotic 95%, rPAV 95%.  EF mod reduced, ~40%. EDP 24 mmHg --> CABG referral   TEE WITHOUT CARDIOVERSION N/A 08/13/2019   Procedure: TRANSESOPHAGEAL ECHOCARDIOGRAM (TEE);  Surgeon: [name], MD;  Location: [place];  Service: Open Heart Surgery;;;EF 30-40%.  Apical akinesis.  Trace AI.  Mild MR.  Normal RV.    TRANSTHORACIC ECHOCARDIOGRAM  12/21/2019   EF improved to 45 and 50%.  Mild LVH.  Moderate HK of the mid anteroapical, inferoapical and inferolateral wall.  Septal motion consistent with LBBB.  Only GR 1 DD.  Moderate reduced RV function.  Normal atrial size.  Aortic root roughly 39 mm   TRANSTHORACIC ECHOCARDIOGRAM  08/11/2019   (non-STEMI) EF 35%.  Apical septal akinesis, apical akinesis.  Mid apical inferior akinesis and apical lateral akinesis.  GRIII DD.  Trivial MR.  Mild aortic root dilation-4.2 mL.       No family history on file.  Social History   Tobacco Use   Smoking status: Former  Types: Cigarettes   Smokeless tobacco: Never    Home Medications Prior to Admission medications   Medication Sig Start Date End Date Taking? Authorizing Provider  acetaminophen (TYLENOL) 500 MG tablet Take 500-1,000 mg by mouth every 8 (eight) hours as needed (for pain).     [provider]  amphetamine-dextroamphetamine (ADDERALL) 10 MG tablet 1 tablet 11/08/20    [provider]  Apple Cider Vinegar 500 MG TABS See admin instructions.    [provider]  aspirin EC 81 MG tablet Take 81 mg by mouth daily.    [provider]  blood glucose meter kit and supplies KIT Dispense based on patient and insurance preference. Use up to four times daily as directed. (ICD-10 E11.9) ONE TOUCH ULTRA # 2 KIT 08/18/19   Lars Pinks M, PA-C  carvedilol (COREG) 3.125 MG tablet Take 3.125 mg by mouth in the morning and at bedtime. 08/17/19   [provider]  Cholecalciferol (VITAMIN D3) 50 MCG (2000 UT) capsule 1 tablet    [provider]  dapagliflozin propanediol (FARXIGA) 5 MG TABS tablet Take 5 mg ( 1/2 of 10 mg ) daily. 01/15/20   [name], MD  Evolocumab (REPATHA SURECLICK) 518 MG/ML SOAJ Inject 140 mg into the skin every 14 (fourteen) days. 09/21/21   [name], MD  glucose blood test strip TEST FOUR TIMES DAILY OR AS DIRECTED 08/18/19   Lars Pinks M, PA-C  Inulin (FIBER CHOICE FRUITY BITES) 1.5 g CHEW See admin instructions.    [provider]  Lancets [facility] ULTRASOFT) lancets Use as instructed 08/18/19   Nani Skillern, PA-C  losartan (COZAAR) 50 MG tablet Take 1 tablet (50 mg total) by mouth daily. 12/01/20 10/13/22  [name], MD  Melatonin 10 MG CAPS See admin instructions.    [provider]  Multiple Vitamin (MULTI-VITAMIN) tablet 1 tablet    [provider]  sildenafil (VIAGRA) 50 MG tablet Take 25-50 mg by mouth daily as needed. 04/10/21   [provider]  terbinafine (LAMISIL) 250 MG tablet Take by mouth. 04/10/21   [provider]  Zinc 50 MG TABS 1 tablet    [provider]    Allergies    Atorvastatin, Icosapent ethyl, and Rosuvastatin  Review of Systems   Review of Systems  Constitutional:  Positive for chills. Negative for activity change and fever.  HENT:  Positive for congestion and rhinorrhea.   Respiratory:   Positive for shortness of breath. Negative for cough.   Cardiovascular:  Negative for chest pain and palpitations.  Gastrointestinal:  Positive for nausea. Negative for abdominal pain and vomiting.  Musculoskeletal:  Positive for myalgias.  Neurological:  Negative for light-headedness and headaches.  All other systems reviewed and are negative.  Physical Exam Updated Vital Signs BP 125/76   Pulse (!) 107   Temp 99 F (37.2 C) (Oral)   Resp (!) 23   SpO2 98%   Physical Exam Vitals and nursing note reviewed.  Constitutional:      General: He is not in acute distress.    Appearance: Normal appearance. He is not ill-appearing.  HENT:     Head: Normocephalic and atraumatic.     Nose: Nose normal.  Eyes:     General: No scleral icterus.    Extraocular Movements: Extraocular movements intact.     Conjunctiva/sclera: Conjunctivae normal.  Cardiovascular:     Rate and Rhythm: Regular rhythm. Tachycardia present.     Pulses: Normal  pulses.     Heart sounds: Normal heart sounds.  Pulmonary:     Effort: Pulmonary effort is normal. No respiratory distress.     Breath sounds: Normal breath sounds. No wheezing or rales.  Abdominal:     General: There is no distension.     Tenderness: There is no abdominal tenderness.  Musculoskeletal:        General: Normal range of motion.     Cervical back: Normal range of motion.     Right lower leg: No edema.     Left lower leg: No edema.  Skin:    General: Skin is warm and dry.  Neurological:     General: No focal deficit present.     Mental Status: He is alert. Mental status is at baseline.    ED Results / Procedures / Treatments   Labs (all labs ordered are listed, but only abnormal results are displayed) Labs Reviewed  RESP PANEL BY RT-PCR (FLU A&B, COVID) [id number]  CBC WITH DIFFERENTIAL/PLATELET  BASIC METABOLIC PANEL  BRAIN NATRIURETIC PEPTIDE  TROPONIN I (HIGH SENSITIVITY)    EKG None  Radiology No results  found.  Procedures Procedures   Medications Ordered in ED Medications  sodium chloride 0.9 % bolus 1,000 mL (has no administration in time range)    ED Course  I have reviewed the triage vital signs and the nursing notes.  Pertinent labs & imaging results that were available during my care of the patient were reviewed by me and considered in my medical decision making (see chart for details).    MDM Rules/Calculators/A&P                           65 year old male with a past medical history of diabetes, hypertension, hyperlipidemia, CAD presents today for evaluation of flulike symptoms and dyspnea on exertion of several day duration that worsened following his Repatha injection yesterday.  Will order chest x-ray, respiratory panel, CBC, BMP, troponin, BNP.  We will provide patient with IV hydration.  2115: Patient's work-up significant for influenza A.  CTA chest negative for PE and with mild pulmonary edema which would explain the elevated BNP.  He is without signs or symptoms of acute heart failure exacerbation outside of dyspnea.  Given that he does not have signs of volume overload will defer diuresis for right now.  This can be reevaluated when he follows up with his PCP.  Patient's troponin initially was 18 then increased to 19.  Given relatively flat trend this is likely demand from acute illness given he is without chest pain. he remains tachycardic at about 115, tachypneic at about 32 reports he feels somewhat better than on arrival.  Discussed given his age, PMH and his current vitals that we can admit for observation.  Patient states he would like to go home and try and recover.  Extensive discussion had regarding return precautions.  Patient and wife both voiced understanding.  We will ambulate patient in the halls with pulse ox to ensure he does not desat prior to discharge.  2145: Patient ambulated without desaturation.  He reports he feels up to going home and trying to  recover.  I again reiterated return precautions.  Both wife and husband voiced understanding.  Tamiflu prescribed.  Patient states he believes he has an appointment with PCP scheduled tomorrow.  Regardless he will call tomorrow to ensure he has a appointment scheduled.   Final Clinical Impression(s) / ED  Diagnoses Final diagnoses:  None    Rx / DC Orders ED Discharge Orders     None        Evlyn Courier, Hershal Coria 11/07/21 2204    Regan Lemming, MD 11/07/21 2244

## 2021-11-07 NOTE — ED Notes (Signed)
Patient transported to CT 

## 2021-11-17 ENCOUNTER — Inpatient Hospital Stay (HOSPITAL_COMMUNITY)
Admission: EM | Admit: 2021-11-17 | Discharge: 2021-11-21 | DRG: 286 | Disposition: A | Discharge status: Home / Self Care | Payer: Medicare Other | Attending: Internal Medicine | Admitting: Internal Medicine

## 2021-11-17 ENCOUNTER — Encounter (HOSPITAL_COMMUNITY): Payer: Self-pay | Admitting: Emergency Medicine

## 2021-11-17 ENCOUNTER — Emergency Department (HOSPITAL_COMMUNITY): Payer: Medicare Other

## 2021-11-17 DIAGNOSIS — I25810 Atherosclerosis of coronary artery bypass graft(s) without angina pectoris: Secondary | ICD-10-CM | POA: Diagnosis present

## 2021-11-17 DIAGNOSIS — B351 Tinea unguium: Secondary | ICD-10-CM | POA: Diagnosis present

## 2021-11-17 DIAGNOSIS — I255 Ischemic cardiomyopathy: Secondary | ICD-10-CM | POA: Diagnosis present

## 2021-11-17 DIAGNOSIS — T466X5A Adverse effect of antihyperlipidemic and antiarteriosclerotic drugs, initial encounter: Secondary | ICD-10-CM | POA: Diagnosis present

## 2021-11-17 DIAGNOSIS — I2510 Atherosclerotic heart disease of native coronary artery without angina pectoris: Secondary | ICD-10-CM | POA: Diagnosis present

## 2021-11-17 DIAGNOSIS — Z888 Allergy status to other drugs, medicaments and biological substances status: Secondary | ICD-10-CM

## 2021-11-17 DIAGNOSIS — Z20822 Contact with and (suspected) exposure to covid-19: Secondary | ICD-10-CM | POA: Diagnosis present

## 2021-11-17 DIAGNOSIS — I10 Essential (primary) hypertension: Secondary | ICD-10-CM | POA: Diagnosis present

## 2021-11-17 DIAGNOSIS — Z79899 Other long term (current) drug therapy: Secondary | ICD-10-CM

## 2021-11-17 DIAGNOSIS — Z28310 Unvaccinated for covid-19: Secondary | ICD-10-CM

## 2021-11-17 DIAGNOSIS — Z87891 Personal history of nicotine dependence: Secondary | ICD-10-CM

## 2021-11-17 DIAGNOSIS — I447 Left bundle-branch block, unspecified: Secondary | ICD-10-CM | POA: Diagnosis present

## 2021-11-17 DIAGNOSIS — Z8249 Family history of ischemic heart disease and other diseases of the circulatory system: Secondary | ICD-10-CM

## 2021-11-17 DIAGNOSIS — I5022 Chronic systolic (congestive) heart failure: Secondary | ICD-10-CM

## 2021-11-17 DIAGNOSIS — R0601 Orthopnea: Secondary | ICD-10-CM

## 2021-11-17 DIAGNOSIS — E1169 Type 2 diabetes mellitus with other specified complication: Secondary | ICD-10-CM | POA: Diagnosis present

## 2021-11-17 DIAGNOSIS — Z7982 Long term (current) use of aspirin: Secondary | ICD-10-CM

## 2021-11-17 DIAGNOSIS — I5023 Acute on chronic systolic (congestive) heart failure: Secondary | ICD-10-CM | POA: Diagnosis present

## 2021-11-17 DIAGNOSIS — E785 Hyperlipidemia, unspecified: Secondary | ICD-10-CM | POA: Diagnosis present

## 2021-11-17 DIAGNOSIS — J111 Influenza due to unidentified influenza virus with other respiratory manifestations: Secondary | ICD-10-CM

## 2021-11-17 DIAGNOSIS — I110 Hypertensive heart disease with heart failure: Secondary | ICD-10-CM | POA: Diagnosis not present

## 2021-11-17 DIAGNOSIS — R0600 Dyspnea, unspecified: Secondary | ICD-10-CM | POA: Diagnosis present

## 2021-11-17 DIAGNOSIS — I2582 Chronic total occlusion of coronary artery: Secondary | ICD-10-CM | POA: Diagnosis present

## 2021-11-17 DIAGNOSIS — I509 Heart failure, unspecified: Secondary | ICD-10-CM

## 2021-11-17 DIAGNOSIS — G720 Drug-induced myopathy: Secondary | ICD-10-CM | POA: Diagnosis present

## 2021-11-17 DIAGNOSIS — I252 Old myocardial infarction: Secondary | ICD-10-CM

## 2021-11-17 HISTORY — DX: Chronic systolic (congestive) heart failure: I50.22

## 2021-11-17 LAB — COMPREHENSIVE METABOLIC PANEL
ALT: 14 U/L (ref 0–44)
AST: 21 U/L (ref 15–41)
Albumin: 4.1 g/dL (ref 3.5–5.0)
Alkaline Phosphatase: 35 U/L — ABNORMAL LOW (ref 38–126)
Anion gap: 12 (ref 5–15)
BUN: 17 mg/dL (ref 8–23)
CO2: 18 mmol/L — ABNORMAL LOW (ref 22–32)
Calcium: 9.3 mg/dL (ref 8.9–10.3)
Chloride: 108 mmol/L (ref 98–111)
Creatinine, Ser: 0.87 mg/dL (ref 0.61–1.24)
GFR, Estimated: >60 mL/min (ref >60)
Glucose, Bld: 108 mg/dL — ABNORMAL HIGH (ref 70–99)
Potassium: 4 mmol/L (ref 3.5–5.1)
Sodium: 138 mmol/L (ref 135–145)
Total Bilirubin: 2.2 mg/dL — ABNORMAL HIGH (ref 0.3–1.2)
Total Protein: 6.6 g/dL (ref 6.5–8.1)

## 2021-11-17 LAB — CBC WITH DIFFERENTIAL/PLATELET
Abs Immature Granulocytes: 0.05 10*3/uL (ref 0.00–0.07)
Basophils Absolute: 0.1 10*3/uL (ref 0.0–0.1)
Basophils Relative: 1 %
Eosinophils Absolute: 0.2 10*3/uL (ref 0.0–0.5)
Eosinophils Relative: 2 %
HCT: 53.2 % — ABNORMAL HIGH (ref 39.0–52.0)
Hemoglobin: 18.4 g/dL — ABNORMAL HIGH (ref 13.0–17.0)
Immature Granulocytes: 1 %
Lymphocytes Relative: 27 %
Lymphs Abs: 2.9 10*3/uL (ref 0.7–4.0)
MCH: 31.1 pg (ref 26.0–34.0)
MCHC: 34.6 g/dL (ref 30.0–36.0)
MCV: 89.9 fL (ref 80.0–100.0)
Monocytes Absolute: 0.8 10*3/uL (ref 0.1–1.0)
Monocytes Relative: 8 %
Neutro Abs: 6.9 10*3/uL (ref 1.7–7.7)
Neutrophils Relative %: 61 %
Platelets: 305 10*3/uL (ref 150–400)
RBC: 5.92 MIL/uL — ABNORMAL HIGH (ref 4.22–5.81)
RDW: 13.2 % (ref 11.5–15.5)
WBC: 10.9 10*3/uL — ABNORMAL HIGH (ref 4.0–10.5)
nRBC: 0 % (ref 0.0–0.2)

## 2021-11-17 LAB — TROPONIN I (HIGH SENSITIVITY)
Troponin I (High Sensitivity): 39 ng/L — ABNORMAL HIGH (ref <18)
Troponin I (High Sensitivity): 41 ng/L — ABNORMAL HIGH (ref <18)

## 2021-11-17 LAB — BRAIN NATRIURETIC PEPTIDE: B Natriuretic Peptide: 653.4 pg/mL — ABNORMAL HIGH (ref 0.0–100.0)

## 2021-11-17 MED ORDER — FUROSEMIDE 10 MG/ML IJ SOLN
40.0000 mg | Freq: Once | INTRAMUSCULAR | Status: AC
  Administered 2021-11-17: 40 mg via INTRAVENOUS
  Filled 2021-11-17: qty 4

## 2021-11-17 NOTE — ED Provider Notes (Signed)
Emergency Medicine Provider Triage Evaluation Note  Anthony Alvarez , a 65 y.o. male  was evaluated in triage.  Pt complains of shortness of breath.  He states that on 11/07/2021 he was diagnosed with influenza.  He states that he was symptomatically doing okay until last night when he began having shortness of breath.  He states that shortness of breath has continued into today and is worsening since his diagnosis.  He states that he has cough and clear sputum.  He denies any fevers.  Endorses nausea without vomiting or diarrhea.  Does endorse decreased appetite.  He denies chest pain or palpitations. Review of Systems  Positive: See above Negative:   Physical Exam  BP 118/85 (BP Location: Right Arm)   Pulse 92   Temp 97.9 F (36.6 C)   Resp 20   SpO2 99%  Gen:   Awake, no distress   Resp:  Tachypnea, left lower lobe with diminished lung sounds and intermittent crackles.  Right lower lobe with diminished lung sounds. MSK:   Moves extremities without difficulty  Other:  S1/S2 with regular rate and rhythm, no murmurs appreciated.  Medical Decision Making  Medically screening exam initiated at 12:08 PM.  Appropriate orders placed.  Anthony Alvarez was informed that the remainder of the evaluation will be completed by another provider, this initial triage assessment does not replace that evaluation, and the importance of remaining in the ED until their evaluation is complete.     Cristopher Peru, PA-C 11/17/21 1209    Rozelle Logan, DO 11/17/21 1646

## 2021-11-17 NOTE — ED Provider Notes (Addendum)
[facility] EMERGENCY DEPARTMENT Provider Note   CSN: [id number] Arrival date & time: 11/17/21  1029     History Chief Complaint  Patient presents with   Shortness of Breath    Anthony Alvarez is a 65 y.o. male.  HPI Patient has had some symptoms of shortness of breath fatigue and aches that started almost 2 weeks ago.  Patient was subsequently diagnosed with influenza.  He did take a course of Tamiflu.  Reports symptoms seem to be improving but now over the past 2 days and nights he is getting more short of breath.  He reports that for about a week or more he has been noticing shortness of breath with climbing stairs.  For the past 2 nights, he has not been able to lie flat sleeping.  Last night he had a lot of difficulty sleeping due to shortness of breath and had to be propped up on pillows and get up out of bed several times.  Patient denies he is having chest pain.  He reports that he was having some cough when he had the flu but the cough seemed to have eased up and then maybe come back somewhat.  No calf pain.  No significant swelling in the legs patient has cardiac history with history of bypass surgery and ischemic cardiomyopathy.  Patient denies congestive heart failure or taking diuretics regularly.    Past Medical History:  Diagnosis Date   HLD (hyperlipidemia)    Hyperlipidemia associated with type 2 diabetes mellitus ([place]) 11/30/2019   Ischemic cardiomyopathy 11/30/2019   Multivessel CAD - 100% LAD (with Diag), 99% mRCA (subtotal occlusio) 08/12/2019   NSTEMI (non-ST elevated myocardial infarction) ([place]) 08/10/2019   S/P Off Pump CABG x 3 08/13/2019   LIMA to LAD RSVG to PDA RSVG to PLVB    Patient Active Problem List   Diagnosis Date Noted   Adverse reaction to statin medication 09/21/2021   Elevated prostate specific antigen (PSA) 05/30/2021   Hypertriglyceridemia 05/29/2021   Statin myopathy 05/29/2021   Essential hypertension 12/18/2020   Ischemic  cardiomyopathy 11/30/2019   Diabetes mellitus type II, non insulin dependent ([place]) 11/30/2019   Hyperlipidemia associated with type 2 diabetes mellitus ([place]) 11/30/2019   S/P Off Pump CABG x 3 08/13/2019   Multivessel CAD - 100% LAD (with Diag), 99% mRCA (subtotal occlusio) 08/12/2019   NSTEMI (non-ST elevated myocardial infarction) ([place]) 08/10/2019    Past Surgical History:  Procedure Laterality Date   CORONARY ARTERY BYPASS GRAFT N/A 08/13/2019   Procedure: OFF PUMP CORONARY ARTERY BYPASS GRAFTING (CABG) x 2 WITH ENDOSCOPIC HARVESTING OF RIGHT GREATER SAPHENOUS VEIN;  Surgeon: [name], MD;  Location: [facility] OR;  Service: Open Heart Surgery: LIMA-LAD, SVG-PDA, SVG-PL.   LEFT HEART CATH AND CORONARY ANGIOGRAPHY N/A 08/11/2019   Procedure: LEFT HEART CATH AND CORONARY ANGIOGRAPHY;  Surgeon: [name], [name], MD;  Location: [facility];  Service: Cardiovascular;  p-mLAD 100%, mRCA thrombotic 95%, rPAV 95%.  EF mod reduced, ~40%. EDP 24 mmHg --> CABG referral   TEE WITHOUT CARDIOVERSION N/A 08/13/2019   Procedure: TRANSESOPHAGEAL ECHOCARDIOGRAM (TEE);  Surgeon: [name], MD;  Location: [place];  Service: Open Heart Surgery;;;EF 30-40%.  Apical akinesis.  Trace AI.  Mild MR.  Normal RV.    TRANSTHORACIC ECHOCARDIOGRAM  12/21/2019   EF improved to 45 and 50%.  Mild LVH.  Moderate HK of the mid anteroapical, inferoapical and inferolateral wall.  Septal motion consistent with LBBB.  Only  GR 1 DD.  Moderate reduced RV function.  Normal atrial size.  Aortic root roughly 39 mm   TRANSTHORACIC ECHOCARDIOGRAM  08/11/2019   (non-STEMI) EF 35%.  Apical septal akinesis, apical akinesis.  Mid apical inferior akinesis and apical lateral akinesis.  GRIII DD.  Trivial MR.  Mild aortic root dilation-4.2 mL.       No family history on file.  Social History   Tobacco Use   Smoking status: Former    Types: Cigarettes   Smokeless tobacco: Never    Home Medications Prior to Admission  medications   Medication Sig Start Date End Date Taking? Authorizing Provider  acetaminophen (TYLENOL) 500 MG tablet Take 500-1,000 mg by mouth every 8 (eight) hours as needed (for pain).     [provider]  amphetamine-dextroamphetamine (ADDERALL) 10 MG tablet 1 tablet 11/08/20   [provider]  Apple Cider Vinegar 500 MG TABS See admin instructions.    [provider]  aspirin EC 81 MG tablet Take 81 mg by mouth daily.    [provider]  blood glucose meter kit and supplies KIT Dispense based on patient and insurance preference. Use up to four times daily as directed. (ICD-10 E11.9) ONE TOUCH ULTRA # 2 KIT 08/18/19   Lars Pinks M, PA-C  carvedilol (COREG) 3.125 MG tablet Take 3.125 mg by mouth in the morning and at bedtime. 08/17/19   [provider]  Cholecalciferol (VITAMIN D3) 50 MCG (2000 UT) capsule 1 tablet    [provider]  dapagliflozin propanediol (FARXIGA) 5 MG TABS tablet Take 5 mg ( 1/2 of 10 mg ) daily. 01/15/20   [name], MD  Evolocumab (REPATHA SURECLICK) 419 MG/ML SOAJ Inject 140 mg into the skin every 14 (fourteen) days. 09/21/21   [name], MD  glucose blood test strip TEST FOUR TIMES DAILY OR AS DIRECTED 08/18/19   Lars Pinks M, PA-C  Inulin (FIBER CHOICE FRUITY BITES) 1.5 g CHEW See admin instructions.    [provider]  Lancets [facility] ULTRASOFT) lancets Use as instructed 08/18/19   Nani Skillern, PA-C  losartan (COZAAR) 50 MG tablet Take 1 tablet (50 mg total) by mouth daily. 12/01/20 10/13/22  [name], MD  Melatonin 10 MG CAPS See admin instructions.    [provider]  Multiple Vitamin (MULTI-VITAMIN) tablet 1 tablet    [provider]  oseltamivir (TAMIFLU) 75 MG capsule Take 1 capsule (75 mg total) by mouth every 12 (twelve) hours. 11/07/21   Evlyn Courier, PA-C  sildenafil (VIAGRA) 50 MG tablet Take 25-50 mg by mouth daily as needed. 04/10/21    [provider]  terbinafine (LAMISIL) 250 MG tablet Take by mouth. 04/10/21   [provider]  Zinc 50 MG TABS 1 tablet    [provider]    Allergies    Atorvastatin, Icosapent ethyl, and Rosuvastatin  Review of Systems   Review of Systems 10 systems reviewed negative except as per HPI Physical Exam Updated Vital Signs BP 125/87 (BP Location: Right Arm)   Pulse 90   Temp 98.2 F (36.8 C) (Oral)   Resp 18   SpO2 100%   Physical Exam Constitutional:      Comments: Alert nontoxic.  Mild increased work of breathing at rest.  Speaking in full sentences.  HENT:     Head: Normocephalic and atraumatic.     Mouth/Throat:     Pharynx: Oropharynx is clear.  Eyes:  Extraocular Movements: Extraocular movements intact.  Cardiovascular:     Comments: Borderline tachycardia.  S3 gallop. Pulmonary:     Comments: Mild increased work of breathing.  Crackles at lower lung fields. Abdominal:     General: There is no distension.     Palpations: Abdomen is soft.     Tenderness: There is no abdominal tenderness. There is no guarding.  Musculoskeletal:        General: No swelling or tenderness. Normal range of motion.     Right lower leg: No edema.     Left lower leg: No edema.  Skin:    General: Skin is warm and dry.  Neurological:     General: No focal deficit present.     Mental Status: He is oriented to person, place, and time.     Coordination: Coordination normal.  Psychiatric:        Mood and Affect: Mood normal.    ED Results / Procedures / Treatments   Labs (all labs ordered are listed, but only abnormal results are displayed) Labs Reviewed  COMPREHENSIVE METABOLIC PANEL - Abnormal; Notable for the following components:      Result Value   CO2 18 (*)    Glucose, Bld 108 (*)    Alkaline Phosphatase 35 (*)    Total Bilirubin 2.2 (*)    All other components within normal limits  CBC WITH DIFFERENTIAL/PLATELET - Abnormal; Notable for the  following components:   WBC 10.9 (*)    RBC 5.92 (*)    Hemoglobin 18.4 (*)    HCT 53.2 (*)    All other components within normal limits  TROPONIN I (HIGH SENSITIVITY) - Abnormal; Notable for the following components:   Troponin I (High Sensitivity) 39 (*)    All other components within normal limits  TROPONIN I (HIGH SENSITIVITY) - Abnormal; Notable for the following components:   Troponin I (High Sensitivity) 41 (*)    All other components within normal limits  RESP PANEL BY RT-PCR (FLU A&B, COVID) [id number]  BRAIN NATRIURETIC PEPTIDE    EKG EKG Interpretation  Date/Time:  Friday November 17 2021 12:01:05 EST Ventricular Rate:  91 PR Interval:  182 QRS Duration: 170 QT Interval:  434 QTC Calculation: 533 R Axis:   121 Text Interpretation: Normal sinus rhythm Right axis deviation Left bundle branch block Abnormal ECG prior LBBB, inferior voltage change Confirmed by Charlesetta Shanks [phone]) on 11/17/2021 11:10:46 PM  Radiology DG Chest 2 View  Result Date: 11/17/2021 CLINICAL DATA:  Pneumonia shortness of breath and wheezing EXAM: CHEST - 2 VIEW COMPARISON:  Chest radiograph 11/07/2021 FINDINGS: Median sternotomy wires and mediastinal surgical clips are stable. The heart mildly enlarged, unchanged. Small bilateral pleural effusions with patchy opacities in the lung bases, right worse than left, have worsened since 11/07/2021. The upper lungs are well aerated. There is no pneumothorax A left clavicular fracture is again seen. There is no new acute osseous abnormality. IMPRESSION: Small bilateral pleural effusions with patchy opacities in the lung bases, right more than left, have worsened since 11/07/2021. Findings could reflect infection and parapneumonic effusions in the correct clinical setting. Recommend follow-up radiographs in 6-8 weeks to assess for resolution. Electronically Signed   By: Valetta Mole M.D.   On: 11/17/2021 12:43    Procedures Procedures   Medications Ordered in  ED Medications  furosemide (LASIX) injection 40 mg (has no administration in time range)    ED Course  I have reviewed the triage vital signs and  the nursing notes.  Pertinent labs & imaging results that were available during my care of the patient were reviewed by me and considered in my medical decision making (see chart for details).    MDM Rules/Calculators/A&P                           Patient presents as outlined.  He did test positive for influenza a week ago and has taken treatment.  He had temporary improvement in symptoms but then a recrudescence of shortness of breath.  Patient's shortness of breath has worsened over the past 2 days and now patient has exertional shortness of breath and orthopnea.  Patient does have history of ischemic cardiomyopathy.  Chest x-ray shows increased effusion and patchy infiltrate.  On exam, patient has S3 gallop and crackles.  Clinically he is well in appearance.  Patient is nontoxic and does not endorse other constitutional symptoms now.  At this time, I suspect patient has an acute episode of CHF associated with recent dress of respiratory illness.  My clinical appearance, patient does not appear to have secondary bacterial pneumonia.  At this time, I am not starting empiric antibiotics.  Will review with medical service for admission.  Consult: [facility] hospitalist, dr. Clearence Ped for admission. Final Clinical Impression(s) / ED Diagnoses Final diagnoses:  Acute on chronic congestive heart failure, unspecified heart failure type ([place])  Influenza  Orthopnea    Rx / DC Orders ED Discharge Orders     None        Charlesetta Shanks, MD 11/17/21 2315    Charlesetta Shanks, MD 11/17/21 2349

## 2021-11-17 NOTE — ED Triage Notes (Signed)
Patient sent to [facility] for evaluation of pulmonary edema secondary to congestive heart failure seen on patient's x-ray today. Patient has x-ray report with him printed from the urgent care. SpO2 99% on room air, no tachypnea, speaking in complete sentences and is in no apparent distress at this time.

## 2021-11-18 ENCOUNTER — Encounter (HOSPITAL_COMMUNITY): Payer: Self-pay | Admitting: Internal Medicine

## 2021-11-18 ENCOUNTER — Observation Stay (HOSPITAL_COMMUNITY): Payer: Medicare Other

## 2021-11-18 DIAGNOSIS — I255 Ischemic cardiomyopathy: Secondary | ICD-10-CM

## 2021-11-18 DIAGNOSIS — R0609 Other forms of dyspnea: Secondary | ICD-10-CM

## 2021-11-18 DIAGNOSIS — Z8249 Family history of ischemic heart disease and other diseases of the circulatory system: Secondary | ICD-10-CM | POA: Diagnosis not present

## 2021-11-18 DIAGNOSIS — Z20822 Contact with and (suspected) exposure to covid-19: Secondary | ICD-10-CM | POA: Diagnosis present

## 2021-11-18 DIAGNOSIS — G720 Drug-induced myopathy: Secondary | ICD-10-CM | POA: Diagnosis present

## 2021-11-18 DIAGNOSIS — R0600 Dyspnea, unspecified: Secondary | ICD-10-CM

## 2021-11-18 DIAGNOSIS — I2510 Atherosclerotic heart disease of native coronary artery without angina pectoris: Secondary | ICD-10-CM | POA: Diagnosis present

## 2021-11-18 DIAGNOSIS — E1169 Type 2 diabetes mellitus with other specified complication: Secondary | ICD-10-CM | POA: Diagnosis present

## 2021-11-18 DIAGNOSIS — I5023 Acute on chronic systolic (congestive) heart failure: Secondary | ICD-10-CM | POA: Diagnosis present

## 2021-11-18 DIAGNOSIS — Z79899 Other long term (current) drug therapy: Secondary | ICD-10-CM | POA: Diagnosis not present

## 2021-11-18 DIAGNOSIS — Z28310 Unvaccinated for covid-19: Secondary | ICD-10-CM | POA: Diagnosis not present

## 2021-11-18 DIAGNOSIS — Z7982 Long term (current) use of aspirin: Secondary | ICD-10-CM | POA: Diagnosis not present

## 2021-11-18 DIAGNOSIS — T466X5A Adverse effect of antihyperlipidemic and antiarteriosclerotic drugs, initial encounter: Secondary | ICD-10-CM | POA: Diagnosis present

## 2021-11-18 DIAGNOSIS — I447 Left bundle-branch block, unspecified: Secondary | ICD-10-CM | POA: Diagnosis present

## 2021-11-18 DIAGNOSIS — E785 Hyperlipidemia, unspecified: Secondary | ICD-10-CM | POA: Diagnosis present

## 2021-11-18 DIAGNOSIS — I509 Heart failure, unspecified: Secondary | ICD-10-CM | POA: Diagnosis not present

## 2021-11-18 DIAGNOSIS — I252 Old myocardial infarction: Secondary | ICD-10-CM | POA: Diagnosis not present

## 2021-11-18 DIAGNOSIS — I25810 Atherosclerosis of coronary artery bypass graft(s) without angina pectoris: Secondary | ICD-10-CM | POA: Diagnosis present

## 2021-11-18 DIAGNOSIS — B351 Tinea unguium: Secondary | ICD-10-CM | POA: Diagnosis present

## 2021-11-18 DIAGNOSIS — Z888 Allergy status to other drugs, medicaments and biological substances status: Secondary | ICD-10-CM | POA: Diagnosis not present

## 2021-11-18 DIAGNOSIS — I110 Hypertensive heart disease with heart failure: Secondary | ICD-10-CM | POA: Diagnosis present

## 2021-11-18 DIAGNOSIS — R0601 Orthopnea: Secondary | ICD-10-CM | POA: Diagnosis present

## 2021-11-18 DIAGNOSIS — I10 Essential (primary) hypertension: Secondary | ICD-10-CM | POA: Diagnosis not present

## 2021-11-18 DIAGNOSIS — Z87891 Personal history of nicotine dependence: Secondary | ICD-10-CM | POA: Diagnosis not present

## 2021-11-18 DIAGNOSIS — I2582 Chronic total occlusion of coronary artery: Secondary | ICD-10-CM | POA: Diagnosis present

## 2021-11-18 HISTORY — PX: TRANSTHORACIC ECHOCARDIOGRAM: SHX275

## 2021-11-18 LAB — CBC WITH DIFFERENTIAL/PLATELET
Abs Immature Granulocytes: 0.03 10*3/uL (ref 0.00–0.07)
Basophils Absolute: 0.1 10*3/uL (ref 0.0–0.1)
Basophils Relative: 1 %
Eosinophils Absolute: 0.1 10*3/uL (ref 0.0–0.5)
Eosinophils Relative: 1 %
HCT: 48.1 % (ref 39.0–52.0)
Hemoglobin: 16.6 g/dL (ref 13.0–17.0)
Immature Granulocytes: 0 %
Lymphocytes Relative: 20 %
Lymphs Abs: 2.1 10*3/uL (ref 0.7–4.0)
MCH: 30.9 pg (ref 26.0–34.0)
MCHC: 34.5 g/dL (ref 30.0–36.0)
MCV: 89.6 fL (ref 80.0–100.0)
Monocytes Absolute: 0.8 10*3/uL (ref 0.1–1.0)
Monocytes Relative: 8 %
Neutro Abs: 7.5 10*3/uL (ref 1.7–7.7)
Neutrophils Relative %: 70 %
Platelets: 280 10*3/uL (ref 150–400)
RBC: 5.37 MIL/uL (ref 4.22–5.81)
RDW: 13.3 % (ref 11.5–15.5)
WBC: 10.6 10*3/uL — ABNORMAL HIGH (ref 4.0–10.5)
nRBC: 0 % (ref 0.0–0.2)

## 2021-11-18 LAB — COMPREHENSIVE METABOLIC PANEL
ALT: 9 U/L (ref 0–44)
AST: 17 U/L (ref 15–41)
Albumin: 3.5 g/dL (ref 3.5–5.0)
Alkaline Phosphatase: 33 U/L — ABNORMAL LOW (ref 38–126)
Anion gap: 12 (ref 5–15)
BUN: 15 mg/dL (ref 8–23)
CO2: 18 mmol/L — ABNORMAL LOW (ref 22–32)
Calcium: 9 mg/dL (ref 8.9–10.3)
Chloride: 108 mmol/L (ref 98–111)
Creatinine, Ser: 0.98 mg/dL (ref 0.61–1.24)
GFR, Estimated: >60 mL/min (ref >60)
Glucose, Bld: 107 mg/dL — ABNORMAL HIGH (ref 70–99)
Potassium: 4.2 mmol/L (ref 3.5–5.1)
Sodium: 138 mmol/L (ref 135–145)
Total Bilirubin: 2.5 mg/dL — ABNORMAL HIGH (ref 0.3–1.2)
Total Protein: 5.7 g/dL — ABNORMAL LOW (ref 6.5–8.1)

## 2021-11-18 LAB — ECHOCARDIOGRAM COMPLETE
Area-P 1/2: 3.88 cm2
Height: 70 in
S' Lateral: 5.3 cm
Weight: 3168 oz

## 2021-11-18 LAB — TROPONIN I (HIGH SENSITIVITY): Troponin I (High Sensitivity): 42 ng/L — ABNORMAL HIGH (ref <18)

## 2021-11-18 LAB — HIV ANTIBODY (ROUTINE TESTING W REFLEX): HIV Screen 4th Generation wRfx: NONREACTIVE

## 2021-11-18 LAB — RESP PANEL BY RT-PCR (FLU A&B, COVID) ARPGX2
Influenza A by PCR: NEGATIVE
Influenza B by PCR: NEGATIVE
SARS Coronavirus 2 by RT PCR: NEGATIVE

## 2021-11-18 LAB — MAGNESIUM: Magnesium: 2 mg/dL (ref 1.7–2.4)

## 2021-11-18 LAB — PROCALCITONIN
Procalcitonin: <0.1 ng/mL
Procalcitonin: <0.1 ng/mL

## 2021-11-18 LAB — TSH: TSH: 0.453 u[IU]/mL (ref 0.350–4.500)

## 2021-11-18 MED ORDER — CARVEDILOL 6.25 MG PO TABS
6.2500 mg | ORAL_TABLET | Freq: Two times a day (BID) | ORAL | Status: DC
  Administered 2021-11-18 – 2021-11-21 (×6): 6.25 mg via ORAL
  Filled 2021-11-18 (×6): qty 1

## 2021-11-18 MED ORDER — ASPIRIN EC 81 MG PO TBEC
81.0000 mg | DELAYED_RELEASE_TABLET | Freq: Every day | ORAL | Status: DC
  Administered 2021-11-18 – 2021-11-21 (×4): 81 mg via ORAL
  Filled 2021-11-18 (×4): qty 1

## 2021-11-18 MED ORDER — CARVEDILOL 3.125 MG PO TABS
3.1250 mg | ORAL_TABLET | Freq: Two times a day (BID) | ORAL | Status: DC
  Administered 2021-11-18: 3.125 mg via ORAL
  Filled 2021-11-18: qty 1

## 2021-11-18 MED ORDER — ACETAMINOPHEN 650 MG RE SUPP: 650.0000 mg | Freq: Four times a day (QID) | RECTAL | Status: DC | PRN

## 2021-11-18 MED ORDER — MORPHINE SULFATE (PF) 2 MG/ML IV SOLN: 2.0000 mg | INTRAVENOUS | Status: DC | PRN

## 2021-11-18 MED ORDER — MELATONIN 3 MG PO TABS
3.0000 mg | ORAL_TABLET | Freq: Every evening | ORAL | Status: DC | PRN
  Filled 2021-11-18: qty 1

## 2021-11-18 MED ORDER — LOSARTAN POTASSIUM 50 MG PO TABS
50.0000 mg | ORAL_TABLET | Freq: Every day | ORAL | Status: DC
  Administered 2021-11-18: 50 mg via ORAL
  Filled 2021-11-18: qty 1

## 2021-11-18 MED ORDER — OXYCODONE HCL 5 MG PO TABS: 5.0000 mg | ORAL_TABLET | ORAL | Status: DC | PRN

## 2021-11-18 MED ORDER — HEPARIN SODIUM (PORCINE) 5000 UNIT/ML IJ SOLN
5000.0000 [IU] | Freq: Three times a day (TID) | INTRAMUSCULAR | Status: DC
  Administered 2021-11-18 – 2021-11-19 (×6): 5000 [IU] via SUBCUTANEOUS
  Filled 2021-11-18 (×6): qty 1

## 2021-11-18 MED ORDER — ONDANSETRON HCL 4 MG/2ML IJ SOLN: 4.0000 mg | Freq: Four times a day (QID) | INTRAMUSCULAR | Status: DC | PRN

## 2021-11-18 MED ORDER — TERBINAFINE HCL 250 MG PO TABS: 250.0000 mg | ORAL_TABLET | Freq: Every day | ORAL | Status: DC

## 2021-11-18 MED ORDER — ACETAMINOPHEN 325 MG PO TABS
650.0000 mg | ORAL_TABLET | Freq: Four times a day (QID) | ORAL | Status: DC | PRN
  Administered 2021-11-20: 650 mg via ORAL
  Filled 2021-11-18: qty 2

## 2021-11-18 MED ORDER — ONDANSETRON HCL 4 MG PO TABS: 4.0000 mg | ORAL_TABLET | Freq: Four times a day (QID) | ORAL | Status: DC | PRN

## 2021-11-18 MED ORDER — FUROSEMIDE 10 MG/ML IJ SOLN
40.0000 mg | Freq: Every day | INTRAMUSCULAR | Status: DC
  Administered 2021-11-18 – 2021-11-19 (×2): 40 mg via INTRAVENOUS
  Filled 2021-11-18 (×2): qty 4

## 2021-11-18 MED ORDER — OSELTAMIVIR PHOSPHATE 75 MG PO CAPS: 75.0000 mg | ORAL_CAPSULE | Freq: Two times a day (BID) | ORAL | Status: DC

## 2021-11-18 NOTE — Consult Note (Signed)
Cardiology Consultation:   Patient ID: Anthony Alvarez MRN: [id number]; DOB: 05-08-1956  Admit date: 11/17/2021 Date of Consult: 11/18/2021  PCP:  Anthony Cruel, MD   [place] Prov[facility] Cardiologist:  Anthony Hew, MD     Patient Profile:   Anthony Alvarez is a 65 y.o. male with a hx of CAD status post CABG x3 (07/2019), hyperlipidemia, HFrEF and diabetes who is being seen 11/18/2021 for the evaluation of CHF at the request of Anthony Alvarez.  History of Present Illness:   Anthony Alvarez is a 65 year old male with past medical history noted above.  He has been followed by Dr. Ellyn Alvarez as an outpatient.  He was admitted for a non-STEMI in August 2020 and underwent cardiac catheterization which revealed critical lesions of the RV Anthony Alvarez 95%, RPA V lesion 95%, mid RCA 95% and proximal LAD 100%.  LVEF at that time was 35 to 45%.  He underwent off pump CABG with LIMA to LAD, SVG to PDA, SVG to PL 08/13/2019 with Dr. Kipp Alvarez.  Postoperatively he was treated with IV Lasix for volume overload.  He was started on Coreg, losartan, atorvastatin.  Follow-up echocardiogram 12/2019 showed improvement in EF to 45 to 50% with mild LVH, moderate hypokinesis of the LV, mid apical segment along with inferior apical/lateral wall, grade 1 diastolic dysfunction, moderately reduced RV function.   He was last seen in the office on 05/2021 and overall reported doing well.  Did report some shortness of breath on exertion but not with routine activity.  Reported having issues with his statin which he had been off of.  Consideration was given to referring to lipid clinic.  He was continued on home medications with carvedilol 3.125 mg twice daily, Farxiga along with plans to start Vascepa 1 g daily.  Since that time he has been started on Repatha for about 6 weeks.  Scented to the ED on 12/2 with complaints of significant dyspnea. He has had dyspnea on exertion progressively for the last couple of months. He was diagnosed with  influenza A on 11/22. SOB continued and he described PND which prompted his admission with urging from his wife. She was trying to prop him up in bed at night.  In the ED his labs showed sodium 138, potassium 4, creatinine 0.87, high-sensitivity troponin 39>> 41>> 42, WBC 10.9, hemoglobin 18.4, BNP 653.  Chest x-ray with small bilateral effusions right greater than left.  EKG showed sinus rhythm, left bundle Anthony Alvarez block.  He was admitted to internal medicine and started on IV Lasix.  Echocardiogram 12/3 showed severe global hypokinesis with LVEF of 20 to 22%, grade 2 diastolic dysfunction, moderately reduced RV, mild MR.   Past Medical History:  Diagnosis Date   HLD (hyperlipidemia)    Hyperlipidemia associated with type 2 diabetes mellitus (Anthony Alvarez) 11/30/2019   Ischemic cardiomyopathy 11/30/2019   Multivessel CAD - 100% LAD (with Diag), 99% mRCA (subtotal occlusio) 08/12/2019   NSTEMI (non-ST elevated myocardial infarction) ([name]) 08/10/2019   S/P Off Pump CABG x 3 08/13/2019   LIMA to LAD RSVG to PDA RSVG to PLVB    Past Surgical History:  Procedure Laterality Date   CORONARY ARTERY BYPASS GRAFT N/A 08/13/2019   Procedure: OFF PUMP CORONARY ARTERY BYPASS GRAFTING (CABG) x 2 WITH ENDOSCOPIC HARVESTING OF RIGHT GREATER SAPHENOUS VEIN;  Surgeon: Anthony Matte, MD;  Location: [facility] OR;  Service: Open Heart Surgery: LIMA-LAD, SVG-PDA, SVG-PL.   LEFT HEART CATH AND CORONARY ANGIOGRAPHY N/A 08/11/2019   Procedure: LEFT HEART  CATH AND CORONARY ANGIOGRAPHY;  Surgeon: Alvarez, Anthony M, MD;  Location: [facility];  Service: Cardiovascular;  p-mLAD 100%, mRCA thrombotic 95%, rPAV 95%.  EF mod reduced, ~40%. EDP 24 mmHg --> CABG referral   TEE WITHOUT CARDIOVERSION N/A 08/13/2019   Procedure: TRANSESOPHAGEAL ECHOCARDIOGRAM (TEE);  Surgeon: Anthony Matte, MD;  Location: [facility];  Service: Open Heart Surgery;;;EF 30-40%.  Apical akinesis.  Trace AI.  Mild MR.  Normal RV.    TRANSTHORACIC  ECHOCARDIOGRAM  12/21/2019   EF improved to 45 and 50%.  Mild LVH.  Moderate HK of the mid anteroapical, inferoapical and inferolateral wall.  Septal motion consistent with LBBB.  Only GR 1 DD.  Moderate reduced RV function.  Normal atrial size.  Aortic root roughly 39 mm   TRANSTHORACIC ECHOCARDIOGRAM  08/11/2019   (non-STEMI) EF 35%.  Apical septal akinesis, apical akinesis.  Mid apical inferior akinesis and apical lateral akinesis.  GRIII DD.  Trivial MR.  Mild aortic root dilation-4.2 mL.     Home Medications:  Prior to Admission medications   Medication Sig Start Date End Date Taking? Authorizing Provider  acetaminophen (TYLENOL) 500 MG tablet Take 500-1,000 mg by mouth every 8 (eight) hours as needed for mild pain.   Yes [provider]  amphetamine-dextroamphetamine (ADDERALL) 20 MG tablet Take 0.5 mg by mouth 4 (four) times daily as needed. Focus 11/08/21  Yes [provider]  aspirin EC 81 MG tablet Take 81 mg by mouth daily.   Yes [provider]  CALCIUM-MAGNESIUM-ZINC PO Take 1 tablet by mouth daily.   Yes [provider]  carvedilol (COREG) 3.125 MG tablet Take 3.125 mg by mouth in the morning and at bedtime. 08/17/19  Yes [provider]  Cholecalciferol (VITAMIN D3) 50 MCG (2000 UT) capsule Take 2,000 Units by mouth daily.   Yes [provider]  dapagliflozin propanediol (FARXIGA) 5 MG TABS tablet Take 5 mg ( 1/2 of 10 mg ) daily. Patient taking differently: Take 5 mg by mouth daily. 01/15/20  Yes Anthony Man, MD  Evolocumab (REPATHA SURECLICK) 989 MG/ML SOAJ Inject 140 mg into the skin every 14 (fourteen) days. 09/21/21  Yes Anthony Man, MD  guaiFENesin (ROBITUSSIN) 100 MG/5ML liquid Take 5 mLs by mouth every 4 (four) hours as needed for cough or to loosen phlegm.   Yes [provider]  losartan (COZAAR) 50 MG tablet Take 1 tablet (50 mg total) by mouth daily. Patient taking differently: Take 25 mg by mouth 2  (two) times daily. 12/01/20 10/13/22 Yes Anthony Man, MD  MAGNESIUM PO Take 1 tablet by mouth daily.   Yes [provider]  Melatonin 10 MG CAPS Take 10 mg by mouth at bedtime as needed (sleep).   Yes [provider]  Multiple Vitamin (MULTI-VITAMIN) tablet Take 1 tablet by mouth daily.   Yes [provider]  sildenafil (VIAGRA) 50 MG tablet Take 25-50 mg by mouth daily as needed for erectile dysfunction. 04/10/21  Yes [provider]  terbinafine (LAMISIL) 250 MG tablet Take 250 mg by mouth See admin instructions. 213m oral daily for 7 days every month, last week of the month 04/10/21  Yes [provider]  Zinc 50 MG TABS Take 50 mg by mouth daily.   Yes [provider]  blood glucose meter kit and supplies KIT Dispense based on patient and insurance preference. Use up to four times daily as directed. (ICD-10 E11.9) ONE TOUCH ULTRA # 2  KIT 08/18/19   Lars Pinks M, PA-C  glucose blood test strip TEST FOUR TIMES DAILY OR AS DIRECTED 08/18/19   Lars Pinks M, PA-C  Lancets [facility] ULTRASOFT) lancets Use as instructed 08/18/19   Nani Skillern, PA-C  oseltamivir (TAMIFLU) 75 MG capsule Take 1 capsule (75 mg total) by mouth every 12 (twelve) hours. Patient not taking: Reported on 11/18/2021 11/07/21   Evlyn Courier, PA-C    Inpatient Medications: Scheduled Meds:  aspirin EC  81 mg Oral Daily   carvedilol  6.25 mg Oral BID WC   furosemide  40 mg Intravenous Daily   heparin  5,000 Units Subcutaneous Q8H   losartan  50 mg Oral Daily   Continuous Infusions:  PRN Meds: acetaminophen **OR** acetaminophen, melatonin, morphine injection, ondansetron **OR** ondansetron (ZOFRAN) IV, oxyCODONE  Allergies:    Allergies  Allergen Reactions   Atorvastatin Other (See Comments)    Memory deficit, fatigue, myalgias, insomnia; similar symptoms with rosuvastatin.   Icosapent Ethyl     lethargy   Rosuvastatin     Other reaction(s):  Other (See Comments)    Social History:   Social History   Socioeconomic History   Marital status: Married    Spouse name: Not on file   Number of children: Not on file   Years of education: Not on file   Highest education level: Not on file  Occupational History   Not on file  Tobacco Use   Smoking status: Former    Types: Cigarettes   Smokeless tobacco: Never  Substance and Sexual Activity   Alcohol use: Not on file   Drug use: Not on file   Sexual activity: Not on file  Other Topics Concern   Not on file  Social History Narrative   Not on file   Social Determinants of Health   Financial Resource Strain: Not on file  Food Insecurity: Not on file  Transportation Needs: Not on file  Physical Activity: Not on file  Stress: Not on file  Social Connections: Not on file  Intimate Partner Violence: Not on file    Family History:   History reviewed. No pertinent family history.   ROS:  Please see the history of present illness.   All other ROS reviewed and negative.     Physical Exam/Data:   Vitals:   11/18/21 1100 11/18/21 1130 11/18/21 1238 11/18/21 1636  BP: 116/86  116/72 112/71  Pulse: 95 88 91 87  Resp: (!) _0 Temp:   98.5 F (36.9 C) 98.3 F (36.8 C)  TempSrc:   Oral Oral  SpO2: 94% 94% 97% 95%  Weight:      Height:        Intake/Output Summary (Last 24 hours) at 11/18/2021 1653 Last data filed at 11/18/2021 1632 Gross per 24 hour  Intake 480 ml  Output 2500 ml  Net -2020 ml   Last 3 Weights 11/17/2021 11/07/2021 09/21/2021  Weight (lbs) 198 lb 200 lb 199 lb  Weight (kg) 89.812 kg 90.719 kg 90.266 kg     Body mass index is 28.41 kg/m.   Vitals:   11/18/21 1238 11/18/21 1636  BP: 116/72 112/71  Pulse: 91 87  Resp: 18 19  Temp: 98.5 F (36.9 C) 98.3 F (36.8 C)  SpO2: 97% 95%     General:  Well nourished, well developed, in no acute distress HEENT: normal Neck: no JVD Vascular: No carotid bruits; Distal pulses 2+  bilaterally Cardiac:  normal S1,  S2; RRR; no murmur  Lungs:  clear to auscultation bilaterally, no wheezing, rhonchi or rales  Abd: soft, nontender, no hepatomegaly  Ext: no edema Musculoskeletal:  No deformities, BUE and BLE strength normal and equal Skin: warm and dry  Neuro:  CNs 2-12 intact, no focal abnormalities noted Psych:  Normal affect   EKG:  The EKG was personally reviewed and demonstrates:    05/29/2021- NSR, non specific intraventricular conduction delay 11/07/2021- NSR, LBBB QRS 167 ms, Jtc <600 ms 11/17/2021- NSR, LBBB QRS 174 ms, Jtc < 564m  Telemetry:  Telemetry was personally reviewed and demonstrates:  NSR, LBBB  Relevant CV Studies: TTE 11/18/2021  1. Severe global hypokinesis. LAD territory is akinetic. Left ventricular ejection fraction, by estimation, is 20 to 25%. The left ventricle has severely decreased function. The left ventricle demonstrates global hypokinesis. The left ventricular  internal cavity size was moderately dilated. Left ventricular diastolic parameters are consistent with Grade II diastolic dysfunction (pseudonormalization).   2. Right ventricular systolic function is moderately reduced. The right ventricular size is normal.   3. The mitral valve is normal in structure. Mild mitral valve  regurgitation.   4. The aortic valve is grossly normal. Aortic valve regurgitation is trivial. Aortic valve sclerosis/calcification is present, without any evidence of aortic stenosis.   5. 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 Aortic dilatation noted.   6. The inferior vena cava is normal in size with greater than 50% respiratory variability, suggesting right atrial pressure of 3 mmHg.   Conclusion(s)/Recommendation(s): LV function severely globally hypokinetic with LAD terriory akinetic worsened compared to prior echo 12/21/2019 . No significant valvular disease.   LHC 08/11/2019  RV Fatumata Kashani lesion is 95% stenosed. RPAV lesion is 95% stenosed. Mid  RCA lesion is 95% stenosed. Prox LAD to Mid LAD lesion is 100% stenosed. There is moderate left ventricular systolic dysfunction. LV end diastolic pressure is moderately elevated. The left ventricular ejection fraction is 35-45% by visual estimate.   1. Critical 2 vessel obstructive CAD    - CTO of the proximal LAD. LAD fills late by left to left collaterals    - 95% thrombotic lesion in the mid RCA with distal embolization into the PL Kent Riendeau 2. Moderate LV dysfunction. EF 40% with severe inferior wall HK 3. Moderately elevated LVEDP 24 mm Hg.   Plan: resume IV heparin. Beta blocker, statin, ASA. Recommend CT surgery consultation for consideration of CABG.  S/p off pump CABG [place]-LAD. SVG-PDA, SVG-PL   Laboratory Data:  High Sensitivity Troponin:   Recent Labs  Lab 11/07/21 1744 11/07/21 1925 11/17/21 1207 11/17/21 1710 11/17/21 2347  TROPONINIHS 18* 19* 39* 41* 42*     Chemistry Recent Labs  Lab 11/17/21 1207 11/18/21 0813  NA 138 138  K 4.0 4.2  CL 108 108  CO2 18* 18*  GLUCOSE 108* 107*  BUN 17 15  CREATININE 0.87 0.98  CALCIUM 9.3 9.0  MG  --  2.0  GFRNONAA >60 >60  ANIONGAP 12 12    Recent Labs  Lab 11/17/21 1207 11/18/21 0813  PROT 6.6 5.7*  ALBUMIN 4.1 3.5  AST 21 17  ALT 14 9  ALKPHOS 35* 33*  BILITOT 2.2* 2.5*   Lipids No results for input(s): CHOL, TRIG, HDL, LABVLDL, LDLCALC, CHOLHDL in the last 168 hours.  Hematology Recent Labs  Lab 11/17/21 1207 11/18/21 0813  WBC 10.9* 10.6*  RBC 5.92* 5.37  HGB 18.4* 16.6  HCT 53.2* 48.1  MCV 89.9 89.6  MCH 31.1 30.9  MCHC 34.6 34.5  RDW 13.2 13.3  PLT 305 280   Thyroid  Recent Labs  Lab 11/18/21 0813  TSH 0.453    BNP Recent Labs  Lab 11/17/21 2303  BNP 653.4*    DDimer No results for input(s): DDIMER in the last 168 hours.   Radiology/Studies:  DG Chest 2 View  Result Date: 11/17/2021 CLINICAL DATA:  Pneumonia shortness of breath and wheezing EXAM: CHEST - 2 VIEW COMPARISON:   Chest radiograph 11/07/2021 FINDINGS: Median sternotomy wires and mediastinal surgical clips are stable. The heart mildly enlarged, unchanged. Small bilateral pleural effusions with patchy opacities in the lung bases, right worse than left, have worsened since 11/07/2021. The upper lungs are well aerated. There is no pneumothorax A left clavicular fracture is again seen. There is no new acute osseous abnormality. IMPRESSION: Small bilateral pleural effusions with patchy opacities in the lung bases, right more than left, have worsened since 11/07/2021. Findings could reflect infection and parapneumonic effusions in the correct clinical setting. Recommend follow-up radiographs in 6-8 weeks to assess for resolution. Electronically Signed   By: Valetta Mole M.D.   On: 11/17/2021 12:43   ECHOCARDIOGRAM COMPLETE  Result Date: 11/18/2021    ECHOCARDIOGRAM REPORT   Patient Name:   Anthony Alvarez Date of Exam: 11/18/2021 Medical Rec #:  [id number]      Height:       70.0 in Accession #:    [id number]     Weight:       198.0 lb Date of Birth:  Nov 04, 1956      BSA:          2.078 m Patient Age:    62 years       BP:           122/71 mmHg Patient Gender: M              HR:           103 bpm. Exam Location:  Inpatient Procedure: 2D Echo, 3D Echo, Color Doppler and Cardiac Doppler Indications:    R06.9 DOE  History:        Patient has prior history of Echocardiogram examinations, most                 recent 12/21/2019. Prior CABG; Risk Factors:Hypertension, Diabetes                 and Dyslipidemia.  Sonographer:    Raquel Sarna Senior RDCS Referring Phys: [id number] ASIA B [place]  1. Severe global hypokinesis. LAD territory is akinetic. Left ventricular ejection fraction, by estimation, is 20 to 25%. The left ventricle has severely decreased function. The left ventricle demonstrates global hypokinesis. The left ventricular internal cavity size was moderately dilated. Left ventricular diastolic parameters are consistent  with Grade II diastolic dysfunction (pseudonormalization).  2. Right ventricular systolic function is moderately reduced. The right ventricular size is normal.  3. The mitral valve is normal in structure. Mild mitral valve regurgitation.  4. The aortic valve is grossly normal. Aortic valve regurgitation is trivial. Aortic valve sclerosis/calcification is present, without any evidence of aortic stenosis.  5. 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 Aortic dilatation noted.  6. The inferior vena cava is normal in size with greater than 50% respiratory variability, suggesting right atrial pressure of 3 mmHg. Conclusion(s)/Recommendation(s): LV function severely globally hypokinetic with LAD terriory akinetic worsened compared to prior echo 12/21/2019 . No sigificant valvular disease. FINDINGS  Left Ventricle: Severe global hypokinesis. LAD territory is akinetic. Left ventricular ejection fraction, by estimation, is 20 to 25%. The left ventricle has severely decreased function. The left ventricle demonstrates global hypokinesis. The left ventricular internal cavity size was moderately dilated. There is no left ventricular hypertrophy. Abnormal (paradoxical) septal motion consistent with post-operative status. Left ventricular diastolic parameters are consistent with Grade II diastolic dysfunction (pseudonormalization). Right Ventricle: The right ventricular size is normal. No increase in right ventricular wall thickness. Right ventricular systolic function is moderately reduced. Left Atrium: Left atrial size was normal in size. Right Atrium: Right atrial size was normal in size. Pericardium: There is no evidence of pericardial effusion. Mitral Valve: The mitral valve is normal in structure. Mild mitral valve regurgitation. Tricuspid Valve: The tricuspid valve is normal in structure. Tricuspid valve regurgitation is not demonstrated. Aortic Valve: The aortic valve is grossly normal. Aortic valve regurgitation is  trivial. Aortic valve sclerosis/calcification is present, without any evidence of aortic stenosis. Pulmonic Valve: The pulmonic valve was not well visualized. Pulmonic valve regurgitation is not visualized. 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 Aortic dilatation noted. Venous: The inferior vena cava is normal in size with greater than 50% respiratory variability, suggesting right atrial pressure of 3 mmHg. IAS/Shunts: No atrial level shunt detected by color flow Doppler.  LEFT VENTRICLE PLAX 2D LVIDd:         5.90 cm   Diastology LVIDs:         5.30 cm   LV e' medial:    5.44 cm/s LV PW:         0.80 cm   LV E/e' medial:  6.5 LV IVS:        0.90 cm   LV e' lateral:   3.70 cm/s LVOT diam:     2.00 cm   LV E/e' lateral: 9.6 LV SV:         49 LV SV Index:   24 LVOT Area:     3.14 cm                           3D Volume EF:                          3D EF:        20 %                          LV EDV:       254 ml                          LV ESV:       204 ml                          LV SV:        51 ml RIGHT VENTRICLE RV S prime:     9.14 cm/s TAPSE (M-mode): 1.3 cm LEFT ATRIUM           Index        RIGHT ATRIUM           Index LA diam:      3.00 cm 1.44 cm/m   RA Area:     15.70 cm LA Vol (A2C): 69.8 ml 33.58 ml/m  RA Volume:   35.90 ml  17.27 ml/m LA Vol (A4C): 43.5 ml 20.93 ml/m  AORTIC VALVE LVOT Vmax:   104.00 cm/s LVOT Vmean:  70.100 cm/s LVOT VTI:    0.156 m  AORTA Ao Root diam: 4.50 cm Ao Asc diam:  3.30 cm MITRAL VALVE MV Area (PHT): 3.88 cm     SHUNTS MV Decel Time: 196 msec     Systemic VTI:  0.16 m MV E velocity: 35.35 cm/s   Systemic Diam: 2.00 cm MV A velocity: 162.00 cm/s MV E/A ratio:  0.22 Landscape architect signed by Phineas Inches Signature Date/Time: 11/18/2021/4:28:47 PM    Final      Assessment and Plan:   Anthony Alvarez is a 65 y.o. male with a hx of CAD status post CABG x3 (07/2019), hyperlipidemia, HFrEF and diabetes who is being seen 11/18/2021 for the  evaluation of CHF at the request of Anthony Alvarez presented with decompensated HF, worsening LV fxn   Decompensated ischemic CM NYHA Class II: Echo this admission shows decline in EF to 20 to 25% with global hypokinesis, LAD territory being akinetic. LV function mildly worsened from prior and he is symptomatic with acute decompensated HF. Will recommend ischemic eval. Initial presentation was significant volume overload, elevated BNP and chest x-ray with edema.  Currently diuresing with IV Lasix, net -2 L; doing well. He has no significant MR, pulmonary pressure normal.  Patient of Dr. Ellyn Alvarez - plan for [facility] on Monday -Continue IV Lasix 40 mg  -continue coreg 6.25 mg BID -hold losartan 50 mg for now prior to contrast dye and can restart post cath - can give hydralazine PRN if SBP > 150; would like good  afterload reduction  CAD status post CABG x3 '20: With new decline in EF there is concern regarding graft failure.  He will need to undergo cardiac catheterization to redefine anatomy. -- Continue aspirin - on repatha  Hyperlipidemia: History of statin intolerance to multiple agents. LDL responded with statin previously.  LDL 211 08/24/2021, started repatha 09/21/2021. LDL 211, TC 297 --Most recently has been on Repatha --Update lipid panel sent  Diabetes: On Farxiga PTA --SSI while inpatient  Hypertension: Blood pressures are on the softer side. --We will hold ARB in anticipation for cath as well as need for diuresis   Risk Assessment/Risk Scores:        New York Heart Association (NYHA) Functional Class NYHA Class II    For questions or updates, please contact [facility] Please consult [email or web address] for contact info under   Janina Mayo 11/18/2021

## 2021-11-18 NOTE — Progress Notes (Signed)
Echocardiogram 2D Echocardiogram has been performed.  Warren Lacy Luisangel Wainright RDCS 11/18/2021, 10:01 AM

## 2021-11-18 NOTE — Progress Notes (Signed)
  Patient admitted after midnight, please see H&P.  His care began prior to midnight.  Feeling better than when he came to the ER.  No wheezing but still with crackles at bases.  Continue IV lasix, daily weights and strict I/Os.  Echo pending. Patient has been intentionally losing weight with a low carb diet so unclear dry weight Anthony Canary DO

## 2021-11-18 NOTE — H&P (Signed)
TRH H&P    Patient Demographics:    Anthony Alvarez, is a 65 y.o. male  MRN: [id number]  DOB - 02/24/1956  Admit Date - 11/17/2021  Referring MD/NP/PA: Johnney Killian  Outpatient Primary MD for the patient is Lawerance Cruel, MD  Patient coming from: Home  Chief complaint-dyspnea   HPI:    Anthony Alvarez  is a 65 y.o. male, with history of hyperlipidemia, ischemic cardiomyopathy, coronary artery disease, and more presents the ED with a chief complaint of dyspnea.  Patient reports that the dyspnea started night before last.  The first night it was a 4-5 out of 10 in severity.  He had mild dyspnea during the day.  He noticed it mostly on exertion, like when he was carrying his dog up a flight of stairs.  This is an activity he is normally able to do.  Last night dyspnea occurred much more severely at an 8-9 out of 10.  Sitting up he did make the dyspnea better.  He went to the urgent care he had a chest x-ray notes that he had worsening opacities need to come into the ED.  Patient reports when the dyspnea happens it wakes him up out of sleep.  He reports dyspnea during the day as almost not noticeable.  It is worth noting the patient had flu on November 22.  He finished a 5-day course of Tamiflu.  Patient still has an associated dry cough.  He also reports sometimes he has URI symptoms with his Repatha injection.  Patient denies chest pain, pitting edema in his legs.  He reports he has been on Repatha about 6 weeks.  He has not been staying hydrated enough at home per his report all.  On further questioning it seems like over the last 3 weeks patient has had intermittent flu-like symptoms.  Patient has no other complaints at this time.  Patient does not smoke, does not drink alcohol, does not use illicit drugs, is not vaccinated for COVID.  Patient is full code.  Chart review reveals the patient does have a history of  ischemic cardiomyopathy and has been on Lasix but has been in the hospital, but does not have a diagnosis of CHF and has not been on Lasix at home.  In the ED Temp 97.9, heart rate 85-92, respiratory 16-20, blood pressure 125/87, satting 100% No leukocytosis with a white blood cell count 10.9, hemoglobin 18.4 Chemistry panel reveals a decreased bicarb at 18 Troponin 10 days ago was 19, today is 34, 41, 42-flat Patient did have a CTA on November 22 which showed small bilateral pleural effusions.  No evidence of pulmonary embolism.  Infectious or inflammatory densities present. EKG today shows normal sinus rhythm, heart rate 91, QTc 533, left bundle branch block -left bundle is new compared to the last couple EKGs Chest x-ray shows small bilateral pleural effusions with patchy opacities in the lungs right greater than left BNP elevated at 650 Admission requested for dyspnea that seems consistent with CHF     Review of systems:  In addition to the HPI above,  No Fever-chills, No Headache, No changes with Vision or hearing, No problems swallowing food or Liquids, No Abdominal pain, No Nausea or Vomiting, bowel movements are regular, No Blood in stool or Urine, No dysuria, No new skin rashes or bruises, No new joints pains-aches,  No new weakness, tingling, numbness in any extremity, No recent weight gain or loss, No polyuria, polydypsia or polyphagia, No significant Mental Stressors.  All other systems reviewed and are negative.    Past History of the following :    Past Medical History:  Diagnosis Date   HLD (hyperlipidemia)    Hyperlipidemia associated with type 2 diabetes mellitus ([facility]) 11/30/2019   Ischemic cardiomyopathy 11/30/2019   Multivessel CAD - 100% LAD (with Diag), 99% mRCA (subtotal occlusio) 08/12/2019   NSTEMI (non-ST elevated myocardial infarction) ([place]) 08/10/2019   S/P Off Pump CABG x 3 08/13/2019   LIMA to LAD RSVG to PDA RSVG to PLVB      Past Surgical  History:  Procedure Laterality Date   CORONARY ARTERY BYPASS GRAFT N/A 08/13/2019   Procedure: OFF PUMP CORONARY ARTERY BYPASS GRAFTING (CABG) x 2 WITH ENDOSCOPIC HARVESTING OF RIGHT GREATER SAPHENOUS VEIN;  Surgeon: [name], MD;  Location: [facility];  Service: Open Heart Surgery: LIMA-LAD, SVG-PDA, SVG-PL.   LEFT HEART CATH AND CORONARY ANGIOGRAPHY N/A 08/11/2019   Procedure: LEFT HEART CATH AND CORONARY ANGIOGRAPHY;  Surgeon: [name], [name], MD;  Location: [facility];  Service: Cardiovascular;  p-mLAD 100%, mRCA thrombotic 95%, rPAV 95%.  EF mod reduced, ~40%. EDP 24 mmHg --> CABG referral   TEE WITHOUT CARDIOVERSION N/A 08/13/2019   Procedure: TRANSESOPHAGEAL ECHOCARDIOGRAM (TEE);  Surgeon: [name], MD;  Location: [facility];  Service: Open Heart Surgery;;;EF 30-40%.  Apical akinesis.  Trace AI.  Mild MR.  Normal RV.    TRANSTHORACIC ECHOCARDIOGRAM  12/21/2019   EF improved to 45 and 50%.  Mild LVH.  Moderate HK of the mid anteroapical, inferoapical and inferolateral wall.  Septal motion consistent with LBBB.  Only GR 1 DD.  Moderate reduced RV function.  Normal atrial size.  Aortic root roughly 39 mm   TRANSTHORACIC ECHOCARDIOGRAM  08/11/2019   (non-STEMI) EF 35%.  Apical septal akinesis, apical akinesis.  Mid apical inferior akinesis and apical lateral akinesis.  GRIII DD.  Trivial MR.  Mild aortic root dilation-4.2 mL.      Social History:      Social History   Tobacco Use   Smoking status: Former    Types: Cigarettes   Smokeless tobacco: Never  Substance Use Topics   Alcohol use: Not on file       Family History :     Family history significant for hypertension   Home Medications:   Prior to Admission medications   Medication Sig Start Date End Date Taking? Authorizing Provider  acetaminophen (TYLENOL) 500 MG tablet Take 500-1,000 mg by mouth every 8 (eight) hours as needed (for pain).     [provider]  amphetamine-dextroamphetamine  (ADDERALL) 10 MG tablet 1 tablet 11/08/20   [provider]  Apple Cider Vinegar 500 MG TABS See admin instructions.    [provider]  aspirin EC 81 MG tablet Take 81 mg by mouth daily.    [provider]  blood glucose meter kit and supplies KIT Dispense based on patient and insurance preference. Use up to four times daily as directed. (ICD-10 E11.9) ONE TOUCH ULTRA # 2  KIT 08/18/19   Lars Pinks M, PA-C  carvedilol (COREG) 3.125 MG tablet Take 3.125 mg by mouth in the morning and at bedtime. 08/17/19   [provider]  Cholecalciferol (VITAMIN D3) 50 MCG (2000 UT) capsule 1 tablet    [provider]  dapagliflozin propanediol (FARXIGA) 5 MG TABS tablet Take 5 mg ( 1/2 of 10 mg ) daily. 01/15/20   [name], MD  Evolocumab (REPATHA SURECLICK) 836 MG/ML SOAJ Inject 140 mg into the skin every 14 (fourteen) days. 09/21/21   [name], MD  glucose blood test strip TEST FOUR TIMES DAILY OR AS DIRECTED 08/18/19   Lars Pinks M, PA-C  Inulin (FIBER CHOICE FRUITY BITES) 1.5 g CHEW See admin instructions.    [provider]  Lancets [facility] ULTRASOFT) lancets Use as instructed 08/18/19   Nani Skillern, PA-C  losartan (COZAAR) 50 MG tablet Take 1 tablet (50 mg total) by mouth daily. 12/01/20 10/13/22  [name], MD  Melatonin 10 MG CAPS See admin instructions.    [provider]  Multiple Vitamin (MULTI-VITAMIN) tablet 1 tablet    [provider]  oseltamivir (TAMIFLU) 75 MG capsule Take 1 capsule (75 mg total) by mouth every 12 (twelve) hours. 11/07/21   Evlyn Courier, PA-C  sildenafil (VIAGRA) 50 MG tablet Take 25-50 mg by mouth daily as needed. 04/10/21   [provider]  terbinafine (LAMISIL) 250 MG tablet Take by mouth. 04/10/21   [provider]  Zinc 50 MG TABS 1 tablet    [provider]     Allergies:     Allergies  Allergen Reactions   Atorvastatin Other (See  Comments)    Memory deficit, fatigue, myalgias, insomnia; similar symptoms with rosuvastatin.   Icosapent Ethyl     lethargy   Rosuvastatin     Other reaction(s): Other (See Comments)     Physical Exam:   Vitals  Blood pressure (!) 144/88, pulse 88, temperature 98.1 F (36.7 C), temperature source Oral, resp. rate (!) 22, height $RemoveBe'5\' 10"'HKbDgVogm$  (1.778 m), weight 89.8 kg, SpO2 96 %.   1.  General: Patient lying supine in bed, head of bed elevated, no acute distress   2. Psychiatric: Alert and oriented x 3, mood and behavior normal for situation, pleasant and cooperative with exam   3. Neurologic: Speech and language are normal, face is symmetric, moves all 4 extremities voluntarily, at baseline without acute deficits on limited exam   4. HEENMT:  Head is atraumatic, normocephalic, pupils reactive to light, neck is supple, JVD present, trachea is midline, mucous membranes are moist   5. Respiratory : Fine crackles on exam with no wheezing, no cyanosis, no increase in work of breathing or accessory muscle use   6. Cardiovascular : Heart rate normal, rhythm is regular, no murmurs, rubs or gallops, no peripheral edema, peripheral pulses palpated   7. Gastrointestinal:  Abdomen is soft, nondistended, nontender to palpation bowel sounds active, no masses or organomegaly palpated   8. Skin:  Erythema of the right toes which patient reports has been there since he was diagnosed with onychomycosis   9.Musculoskeletal:  No acute deformities or trauma, no asymmetry in tone, no peripheral edema, peripheral pulses palpated, no tenderness to palpation in the extremities     Data Review:    CBC Recent Labs  Lab 11/17/21 1207  WBC 10.9*  HGB 18.4*  HCT 53.2*  PLT 305  MCV 89.9  MCH 31.1  MCHC 34.6  RDW 13.2  LYMPHSABS 2.9  MONOABS 0.8  EOSABS 0.2  BASOSABS 0.1    ------------------------------------------------------------------------------------------------------------------  Results for orders placed or performed during the hospital encounter of 11/17/21 (from the past 48 hour(s))  Comprehensive metabolic panel     Status: Abnormal   Collection Time: 11/17/21 12:07 PM  Result Value Ref Range   Sodium 138 135 - 145 mmol/L   Potassium 4.0 3.5 - 5.1 mmol/L   Chloride 108 98 - 111 mmol/L   CO2 18 (L) 22 - 32 mmol/L   Glucose, Bld 108 (H) 70 - 99 mg/dL    Comment: Glucose reference range applies only to samples taken after fasting for at least 8 hours.   BUN 17 8 - 23 mg/dL   Creatinine, Ser 0.87 0.61 - 1.24 mg/dL   Calcium 9.3 8.9 - 10.3 mg/dL   Total Protein 6.6 6.5 - 8.1 g/dL   Albumin 4.1 3.5 - 5.0 g/dL   AST 21 15 - 41 U/L   ALT 14 0 - 44 U/L   Alkaline Phosphatase 35 (L) 38 - 126 U/L   Total Bilirubin 2.2 (H) 0.3 - 1.2 mg/dL   GFR, Estimated >60 >60 mL/min    Comment: (NOTE) Calculated using the CKD-EPI Creatinine Equation (2021)    Anion gap 12 5 - 15    Comment: Performed at [facility] [address] [place], [place] [address]  Troponin I (High Sensitivity)     Status: Abnormal   Collection Time: 11/17/21 12:07 PM  Result Value Ref Range   Troponin I (High Sensitivity) 39 (H) <18 ng/L    Comment: (NOTE) Elevated high sensitivity troponin I (hsTnI) values and significant  changes across serial measurements may suggest ACS but many other  chronic and acute conditions are known to elevate hsTnI results.  Refer to the "Links" section for chest pain algorithms and additional  guidance. Performed at [facility], [place] [address] [place], [place] [address]   CBC with Differential     Status: Abnormal   Collection Time: 11/17/21 12:07 PM  Result Value Ref Range   WBC 10.9 (H) 4.0 - 10.5 K/uL   RBC 5.92 (H) 4.22 - 5.81 MIL/uL   Hemoglobin 18.4 (H) 13.0 - 17.0 g/dL   HCT 53.2 (H) 39.0 - 52.0 %   MCV 89.9 80.0 -  100.0 fL   MCH 31.1 26.0 - 34.0 pg   MCHC 34.6 30.0 - 36.0 g/dL   RDW 13.2 11.5 - 15.5 %   Platelets 305 150 - 400 K/uL   nRBC 0.0 0.0 - 0.2 %   Neutrophils Relative % 61 %   Neutro Abs 6.9 1.7 - 7.7 K/uL   Lymphocytes Relative 27 %   Lymphs Abs 2.9 0.7 - 4.0 K/uL   Monocytes Relative 8 %   Monocytes Absolute 0.8 0.1 - 1.0 K/uL   Eosinophils Relative 2 %   Eosinophils Absolute 0.2 0.0 - 0.5 K/uL   Basophils Relative 1 %   Basophils Absolute 0.1 0.0 - 0.1 K/uL   Immature Granulocytes 1 %   Abs Immature Granulocytes 0.05 0.00 - 0.07 K/uL    Comment: Performed at [facility] [address], [place], [place] [address]  Troponin I (High Sensitivity)     Status: Abnormal   Collection Time: 11/17/21  5:10 PM  Result Value Ref Range   Troponin I (High Sensitivity) 41 (H) <18 ng/L    Comment: (NOTE) Elevated high sensitivity troponin I (  hsTnI) values and significant  changes across serial measurements may suggest ACS but many other  chronic and acute conditions are known to elevate hsTnI results.  Refer to the "Links" section for chest pain algorithms and additional  guidance. Performed at [facility], [place] [address], [place], [place] [address]   Resp Panel by RT-PCR (Flu A&B, Covid) Nasopharyngeal Swab     Status: None   Collection Time: 11/17/21 10:58 PM   Specimen: Nasopharyngeal Swab; Nasopharyngeal(NP) swabs in vial transport medium  Result Value Ref Range   SARS Coronavirus 2 by RT PCR NEGATIVE NEGATIVE    Comment: (NOTE) SARS-CoV-2 target nucleic acids are NOT DETECTED.  The SARS-CoV-2 RNA is generally detectable in upper respiratory specimens during the acute phase of infection. The lowest concentration of SARS-CoV-2 viral copies this assay can detect is 138 copies/mL. A negative result does not preclude SARS-Cov-2 infection and should not be used as the sole basis for treatment or other patient management decisions. A negative result may occur with   improper specimen collection/handling, submission of specimen other than nasopharyngeal swab, presence of viral mutation(s) within the areas targeted by this assay, and inadequate number of viral copies(<138 copies/mL). A negative result must be combined with clinical observations, patient history, and epidemiological information. The expected result is Negative.  Fact Sheet for Patients:  EntrepreneurPulse.com.au  Fact Sheet for Healthcare Providers:  IncredibleEmployment.be  This test is no t yet approved or cleared by the [place] FDA and  has been authorized for detection and/or diagnosis of SARS-CoV-2 by FDA under an Emergency Use Authorization (EUA). This EUA will remain  in effect (meaning this test can be used) for the duration of the COVID-19 declaration under Section 564(b)(1) of the Act, 21 U.S.C.section 360bbb-3(b)(1), unless the authorization is terminated  or revoked sooner.       Influenza A by PCR NEGATIVE NEGATIVE   Influenza B by PCR NEGATIVE NEGATIVE    Comment: (NOTE) The Xpert Xpress SARS-CoV-2/FLU/RSV plus assay is intended as an aid in the diagnosis of influenza from Nasopharyngeal swab specimens and should not be used as a sole basis for treatment. Nasal washings and aspirates are unacceptable for Xpert Xpress SARS-CoV-2/FLU/RSV testing.  Fact Sheet for Patients: EntrepreneurPulse.com.au  Fact Sheet for Healthcare Providers: IncredibleEmployment.be  This test is not yet approved or cleared by the [place] FDA and has been authorized for detection and/or diagnosis of SARS-CoV-2 by FDA under an Emergency Use Authorization (EUA). This EUA will remain in effect (meaning this test can be used) for the duration of the COVID-19 declaration under Section 564(b)(1) of the Act, 21 U.S.C. section 360bbb-3(b)(1), unless the authorization is terminated or revoked.  Performed at  [facility], [place] [address], [place], [place] [address]   Brain natriuretic peptide     Status: Abnormal   Collection Time: 11/17/21 11:03 PM  Result Value Ref Range   B Natriuretic Peptide 653.4 (H) 0.0 - 100.0 pg/mL    Comment: Performed at [facility] [address], [place], [place] [address]  Procalcitonin - Baseline     Status: None   Collection Time: 11/17/21 11:47 PM  Result Value Ref Range   Procalcitonin <0.10 ng/mL    Comment:        Interpretation: PCT (Procalcitonin) <= 0.5 ng/mL: Systemic infection (sepsis) is not likely. Local bacterial infection is possible. (NOTE)       Sepsis PCT Algorithm  Lower Respiratory Tract                                      Infection PCT Algorithm    ----------------------------     ----------------------------         PCT < 0.25 ng/mL                PCT < 0.10 ng/mL          Strongly encourage             Strongly discourage   discontinuation of antibiotics    initiation of antibiotics    ----------------------------     -----------------------------       PCT 0.25 - 0.50 ng/mL            PCT 0.10 - 0.25 ng/mL               OR       >80% decrease in PCT            Discourage initiation of                                            antibiotics      Encourage discontinuation           of antibiotics    ----------------------------     -----------------------------         PCT >= 0.50 ng/mL              PCT 0.26 - 0.50 ng/mL               AND        <80% decrease in PCT             Encourage initiation of                                             antibiotics       Encourage continuation           of antibiotics    ----------------------------     -----------------------------        PCT >= 0.50 ng/mL                  PCT > 0.50 ng/mL               AND         increase in PCT                  Strongly encourage                                      initiation of antibiotics    Strongly encourage  escalation           of antibiotics                                     -----------------------------  PCT <= 0.25 ng/mL                                                 OR                                        > 80% decrease in PCT                                      Discontinue / Do not initiate                                             antibiotics  Performed at [facility], [place] [address] [place], [place] [address]   Troponin I (High Sensitivity)     Status: Abnormal   Collection Time: 11/17/21 11:47 PM  Result Value Ref Range   Troponin I (High Sensitivity) 42 (H) <18 ng/L    Comment: (NOTE) Elevated high sensitivity troponin I (hsTnI) values and significant  changes across serial measurements may suggest ACS but many other  chronic and acute conditions are known to elevate hsTnI results.  Refer to the "Links" section for chest pain algorithms and additional  guidance. Performed at [facility], [place] [address] [place], [place] [address]     Chemistries  Recent Labs  Lab 11/17/21 1207  NA 138  K 4.0  CL 108  CO2 18*  GLUCOSE 108*  BUN 17  CREATININE 0.87  CALCIUM 9.3  AST 21  ALT 14  ALKPHOS 35*  BILITOT 2.2*   ------------------------------------------------------------------------------------------------------------------  ------------------------------------------------------------------------------------------------------------------ GFR: Estimated Creatinine Clearance: 92.9 mL/min (by C-G formula based on SCr of 0.87 mg/dL). Liver Function Tests: Recent Labs  Lab 11/17/21 1207  AST 21  ALT 14  ALKPHOS 35*  BILITOT 2.2*  PROT 6.6  ALBUMIN 4.1   No results for input(s): LIPASE, AMYLASE in the last 168 hours. No results for input(s): AMMONIA in the last 168 hours. Coagulation Profile: No results for input(s): INR, PROTIME in the last 168 hours. Cardiac Enzymes: No results for input(s):  CKTOTAL, CKMB, CKMBINDEX, TROPONINI in the last 168 hours. BNP (last 3 results) No results for input(s): PROBNP in the last 8760 hours. HbA1C: No results for input(s): HGBA1C in the last 72 hours. CBG: No results for input(s): GLUCAP in the last 168 hours. Lipid Profile: No results for input(s): CHOL, HDL, LDLCALC, TRIG, CHOLHDL, LDLDIRECT in the last 72 hours. Thyroid Function Tests: No results for input(s): TSH, T4TOTAL, FREET4, T3FREE, THYROIDAB in the last 72 hours. Anemia Panel: No results for input(s): VITAMINB12, FOLATE, FERRITIN, TIBC, IRON, RETICCTPCT in the last 72 hours.  --------------------------------------------------------------------------------------------------------------- Urine analysis: No results found for: COLORURINE, APPEARANCEUR, LABSPEC, PHURINE, GLUCOSEU, HGBUR, BILIRUBINUR, KETONESUR, PROTEINUR, UROBILINOGEN, NITRITE, LEUKOCYTESUR    Imaging Results:    DG Chest 2 View  Result Date: 11/17/2021 CLINICAL DATA:  Pneumonia shortness of breath and wheezing EXAM: CHEST - 2 VIEW COMPARISON:  Chest radiograph 11/07/2021 FINDINGS: Median sternotomy wires and mediastinal surgical clips are stable. The heart mildly enlarged, unchanged. Small  bilateral pleural effusions with patchy opacities in the lung bases, right worse than left, have worsened since 11/07/2021. The upper lungs are well aerated. There is no pneumothorax A left clavicular fracture is again seen. There is no new acute osseous abnormality. IMPRESSION: Small bilateral pleural effusions with patchy opacities in the lung bases, right more than left, have worsened since 11/07/2021. Findings could reflect infection and parapneumonic effusions in the correct clinical setting. Recommend follow-up radiographs in 6-8 weeks to assess for resolution. Electronically Signed   By: Valetta Mole M.D.   On: 11/17/2021 12:43      Assessment & Plan:    Principal Problem:   Dyspnea Active Problems:   Ischemic  cardiomyopathy   Hyperlipidemia associated with type 2 diabetes mellitus (HCC)   Essential hypertension   Onychomycosis   Dyspnea Most likely consistent with CHF Patient has JVD, BNP is elevated, pleural effusion on chest x-ray Chest x-ray showed opacities, procalcitonin negative Patient did recently have the flu and finished Tamiflu Patient recently evaluated for blood clot with CTA of November 07, 2021 Continue Lasix, get echo in the a.m. Fluid restrictions, monitor INO Continue to monitor Hyperlipidemia Patient has statin myopathy, resume Repatha outpatient Ischemic cardiomyopathy Continue aspirin, Coreg, losartan Essential hypertension Continue losartan and Coreg Onychomycosis Continue Lamisil Diabetes mellitus type 2 Hold Farxiga Currently euglycemic and 180 Add sliding scale if patient becomes hyperglycemic    DVT Prophylaxis-   Heparin - SCDs   AM Labs Ordered, also please review Full Orders  Family Communication: Admission, patients condition and plan of care including tests being ordered have been discussed with the patient and wife who indicate understanding and agree with the plan and Code Status.  Code Status: Full  Admission status: Observation Disposition: Anticipated Discharge date 24-36 hours discharge to home  Time spent in minutes : [employer]

## 2021-11-19 LAB — LIPID PANEL
Cholesterol: 75 mg/dL (ref 0–200)
HDL: 30 mg/dL — ABNORMAL LOW (ref >40)
LDL Cholesterol: 18 mg/dL (ref 0–99)
Total CHOL/HDL Ratio: 2.5 RATIO
Triglycerides: 134 mg/dL (ref <150)
VLDL: 27 mg/dL (ref 0–40)

## 2021-11-19 LAB — BASIC METABOLIC PANEL
Anion gap: 12 (ref 5–15)
BUN: 17 mg/dL (ref 8–23)
CO2: 20 mmol/L — ABNORMAL LOW (ref 22–32)
Calcium: 9.3 mg/dL (ref 8.9–10.3)
Chloride: 106 mmol/L (ref 98–111)
Creatinine, Ser: 0.97 mg/dL (ref 0.61–1.24)
GFR, Estimated: >60 mL/min (ref >60)
Glucose, Bld: 115 mg/dL — ABNORMAL HIGH (ref 70–99)
Potassium: 4 mmol/L (ref 3.5–5.1)
Sodium: 138 mmol/L (ref 135–145)

## 2021-11-19 LAB — LACTIC ACID, PLASMA: Lactic Acid, Venous: 1.3 mmol/L (ref 0.5–1.9)

## 2021-11-19 MED ORDER — SODIUM CHLORIDE 0.9 % IV SOLN: 250.0000 mL | INTRAVENOUS | Status: DC | PRN

## 2021-11-19 MED ORDER — SODIUM CHLORIDE 0.9 % IV SOLN: INTRAVENOUS | Status: DC

## 2021-11-19 MED ORDER — FUROSEMIDE 10 MG/ML IJ SOLN
80.0000 mg | Freq: Every day | INTRAMUSCULAR | Status: DC
  Administered 2021-11-20 – 2021-11-21 (×2): 80 mg via INTRAVENOUS
  Filled 2021-11-19 (×2): qty 8

## 2021-11-19 MED ORDER — SODIUM CHLORIDE 0.9% FLUSH
3.0000 mL | Freq: Two times a day (BID) | INTRAVENOUS | Status: DC
  Administered 2021-11-19 – 2021-11-21 (×3): 3 mL via INTRAVENOUS

## 2021-11-19 MED ORDER — POTASSIUM CHLORIDE CRYS ER 20 MEQ PO TBCR
40.0000 meq | EXTENDED_RELEASE_TABLET | Freq: Every day | ORAL | Status: DC
  Administered 2021-11-19 – 2021-11-21 (×3): 40 meq via ORAL
  Filled 2021-11-19 (×3): qty 2

## 2021-11-19 MED ORDER — SODIUM CHLORIDE 0.9% FLUSH: 3.0000 mL | INTRAVENOUS | Status: DC | PRN

## 2021-11-19 MED ORDER — ASPIRIN 81 MG PO CHEW
81.0000 mg | CHEWABLE_TABLET | ORAL | Status: AC
  Administered 2021-11-20: 81 mg via ORAL
  Filled 2021-11-19: qty 1

## 2021-11-19 NOTE — Progress Notes (Addendum)
 Progress Note  Patient Name: Anthony Alvarez Date of Encounter: 11/19/2021  [facility] Cardiologist: David Harding, MD   Subjective   No chest pain, some shortness of breath overnight.   Inpatient Medications    Scheduled Meds:  aspirin EC  81 mg Oral Daily   carvedilol  6.25 mg Oral BID WC   furosemide  40 mg Intravenous Daily   heparin  5,000 Units Subcutaneous Q8H   Continuous Infusions:  PRN Meds: acetaminophen **OR** acetaminophen, melatonin, morphine injection, ondansetron **OR** ondansetron (ZOFRAN) IV, oxyCODONE   Vital Signs    Vitals:   11/19/21 0009 11/19/21 0018 11/19/21 0429 11/19/21 0724  BP: 93/77 105/64 98/60 (!) 118/103  Pulse: 97 86 76 84  Resp: 17  20 18  Temp: 97.9 F (36.6 C)  97.8 F (36.6 C) 98.1 F (36.7 C)  TempSrc: Oral  Oral Oral  SpO2: 92%  100% 96%  Weight:   89.4 kg   Height:        Intake/Output Summary (Last 24 hours) at 11/19/2021 0859 Last data filed at 11/19/2021 0700 Gross per 24 hour  Intake 1200 ml  Output 1400 ml  Net -200 ml   Last 3 Weights 11/19/2021 11/17/2021 11/07/2021  Weight (lbs) 197 lb 1.6 oz 198 lb 200 lb  Weight (kg) 89.404 kg 89.812 kg 90.719 kg      Telemetry    SR with LBBB - Personally Reviewed  ECG    No new tracing this morning  Physical Exam   GEN: No acute distress.   Neck: No JVD Cardiac: RRR, no murmurs, rubs, or gallops.  Respiratory: Clear to auscultation bilaterally. GI: Soft, nontender, non-distended  MS: No edema; No deformity. Neuro:  Nonfocal  Psych: Normal affect   Labs    High Sensitivity Troponin:   Recent Labs  Lab 11/07/21 1744 11/07/21 1925 11/17/21 1207 11/17/21 1710 11/17/21 2347  TROPONINIHS 18* 19* 39* 41* 42*     Chemistry Recent Labs  Lab 11/17/21 1207 11/18/21 0813 11/19/21 0555  NA 138 138 138  K 4.0 4.2 4.0  CL 108 108 106  CO2 18* 18* 20*  GLUCOSE 108* 107* 115*  BUN 17 15 17  CREATININE 0.87 0.98 0.97  CALCIUM 9.3 9.0 9.3  MG  --  2.0   --   PROT 6.6 5.7*  --   ALBUMIN 4.1 3.5  --   AST 21 17  --   ALT 14 9  --   ALKPHOS 35* 33*  --   BILITOT 2.2* 2.5*  --   GFRNONAA >60 >60 >60  ANIONGAP 12 12 12    Lipids  Recent Labs  Lab 11/19/21 0555  CHOL 75  TRIG 134  HDL 30*  LDLCALC 18  CHOLHDL 2.5    Hematology Recent Labs  Lab 11/17/21 1207 11/18/21 0813  WBC 10.9* 10.6*  RBC 5.92* 5.37  HGB 18.4* 16.6  HCT 53.2* 48.1  MCV 89.9 89.6  MCH 31.1 30.9  MCHC 34.6 34.5  RDW 13.2 13.3  PLT 305 280   Thyroid  Recent Labs  Lab 11/18/21 0813  TSH 0.453    BNP Recent Labs  Lab 11/17/21 2303  BNP 653.4*    DDimer No results for input(s): DDIMER in the last 168 hours.   Radiology    DG Chest 2 View  Result Date: 11/17/2021 CLINICAL DATA:  Pneumonia shortness of breath and wheezing EXAM: CHEST - 2 VIEW COMPARISON:  Chest radiograph 11/07/2021 FINDINGS: Median sternotomy wires   and mediastinal surgical clips are stable. The heart mildly enlarged, unchanged. Small bilateral pleural effusions with patchy opacities in the lung bases, right worse than left, have worsened since 11/07/2021. The upper lungs are well aerated. There is no pneumothorax A left clavicular fracture is again seen. There is no new acute osseous abnormality. IMPRESSION: Small bilateral pleural effusions with patchy opacities in the lung bases, right more than left, have worsened since 11/07/2021. Findings could reflect infection and parapneumonic effusions in the correct clinical setting. Recommend follow-up radiographs in 6-8 weeks to assess for resolution. Electronically Signed   By: Lesia Hausen M.D.   On: 11/17/2021 12:43   ECHOCARDIOGRAM COMPLETE  Result Date: 11/18/2021    ECHOCARDIOGRAM REPORT   Patient Name:   Anthony Alvarez Date of Exam: 11/18/2021 Medical Rec #:  [id number]      Height:       70.0 in Accession #:    [id number]     Weight:       198.0 lb Date of Birth:  [birth year]      BSA:          2.078 m Patient Age:    65 years        BP:           122/71 mmHg Patient Gender: M              HR:           103 bpm. Exam Location:  Inpatient Procedure: 2D Echo, 3D Echo, Color Doppler and Cardiac Doppler Indications:    R06.9 DOE  History:        Patient has prior history of Echocardiogram examinations, most                 recent 12/21/2019. Prior CABG; Risk Factors:Hypertension, Diabetes                 and Dyslipidemia.  Sonographer:    Irving Burton Senior RDCS Referring Phys: [id number] ASIA B ZIERLE-GHOSH IMPRESSIONS  1. Severe global hypokinesis. LAD territory is akinetic. Left ventricular ejection fraction, by estimation, is 20 to 25%. The left ventricle has severely decreased function. The left ventricle demonstrates global hypokinesis. The left ventricular internal cavity size was moderately dilated. Left ventricular diastolic parameters are consistent with Grade II diastolic dysfunction (pseudonormalization).  2. Right ventricular systolic function is moderately reduced. The right ventricular size is normal.  3. The mitral valve is normal in structure. Mild mitral valve regurgitation.  4. The aortic valve is grossly normal. Aortic valve regurgitation is trivial. Aortic valve sclerosis/calcification is present, without any evidence of aortic stenosis.  5. 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 Aortic dilatation noted.  6. The inferior vena cava is normal in size with greater than 50% respiratory variability, suggesting right atrial pressure of 3 mmHg. Conclusion(s)/Recommendation(s): LV function severely globally hypokinetic with LAD terriory akinetic worsened compared to prior echo 12/21/2019 . No sigificant valvular disease. FINDINGS  Left Ventricle: Severe global hypokinesis. LAD territory is akinetic. Left ventricular ejection fraction, by estimation, is 20 to 25%. The left ventricle has severely decreased function. The left ventricle demonstrates global hypokinesis. The left ventricular internal cavity size was moderately dilated. There  is no left ventricular hypertrophy. Abnormal (paradoxical) septal motion consistent with post-operative status. Left ventricular diastolic parameters are consistent with Grade II diastolic dysfunction (pseudonormalization). Right Ventricle: The right ventricular size is normal. No increase in right ventricular wall thickness. Right ventricular systolic function is moderately reduced. Left  Atrium: Left atrial size was normal in size. Right Atrium: Right atrial size was normal in size. Pericardium: There is no evidence of pericardial effusion. Mitral Valve: The mitral valve is normal in structure. Mild mitral valve regurgitation. Tricuspid Valve: The tricuspid valve is normal in structure. Tricuspid valve regurgitation is not demonstrated. Aortic Valve: The aortic valve is grossly normal. Aortic valve regurgitation is trivial. Aortic valve sclerosis/calcification is present, without any evidence of aortic stenosis. Pulmonic Valve: The pulmonic valve was not well visualized. Pulmonic valve regurgitation is not visualized. 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 Aortic dilatation noted. Venous: The inferior vena cava is normal in size with greater than 50% respiratory variability, suggesting right atrial pressure of 3 mmHg. IAS/Shunts: No atrial level shunt detected by color flow Doppler.  LEFT VENTRICLE PLAX 2D LVIDd:         5.90 cm   Diastology LVIDs:         5.30 cm   LV e' medial:    5.44 cm/s LV PW:         0.80 cm   LV E/e' medial:  6.5 LV IVS:        0.90 cm   LV e' lateral:   3.70 cm/s LVOT diam:     2.00 cm   LV E/e' lateral: 9.6 LV SV:         49 LV SV Index:   24 LVOT Area:     3.14 cm                           3D Volume EF:                          3D EF:        20 %                          LV EDV:       254 ml                          LV ESV:       204 ml                          LV SV:        51 ml RIGHT VENTRICLE RV S prime:     9.14 cm/s TAPSE (M-mode): 1.3 cm LEFT ATRIUM            Index        RIGHT ATRIUM           Index LA diam:      3.00 cm 1.44 cm/m   RA Area:     15.70 cm LA Vol (A2C): 69.8 ml 33.58 ml/m  RA Volume:   35.90 ml  17.27 ml/m LA Vol (A4C): 43.5 ml 20.93 ml/m  AORTIC VALVE LVOT Vmax:   104.00 cm/s LVOT Vmean:  70.100 cm/s LVOT VTI:    0.156 m  AORTA Ao Root diam: 4.50 cm Ao Asc diam:  3.30 cm MITRAL VALVE MV Area (PHT): 3.88 cm     SHUNTS MV Decel Time: 196 msec     Systemic VTI:  0.16 m MV E velocity: 35.35 cm/s   Systemic Diam: 2.00 cm MV A velocity: 162.00 cm/s MV E/A ratio:  0.22 Carolan Clines Electronically signed by Carolan Clines Signature Date/Time: 11/18/2021/4:28:47 PM    Final     Cardiac Studies   TTE 11/18/2021   1. Severe global hypokinesis. LAD territory is akinetic. Left ventricular ejection fraction, by estimation, is 20 to 25%. The left ventricle has severely decreased function. The left ventricle demonstrates global hypokinesis. The left ventricular  internal cavity size was moderately dilated. Left ventricular diastolic parameters are consistent with Grade II diastolic dysfunction (pseudonormalization).   2. Right ventricular systolic function is moderately reduced. The right ventricular size is normal.   3. The mitral valve is normal in structure. Mild mitral valve  regurgitation.   4. The aortic valve is grossly normal. Aortic valve regurgitation is trivial. Aortic valve sclerosis/calcification is present, without any evidence of aortic stenosis.   5. 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 Aortic dilatation noted.   6. The inferior vena cava is normal in size with greater than 50% respiratory variability, suggesting right atrial pressure of 3 mmHg.    Conclusion(s)/Recommendation(s): LV function severely globally hypokinetic with LAD terriory akinetic worsened compared to prior echo 12/21/2019 . No significant valvular disease.    LHC 08/11/2019   RV Branch lesion is 95% stenosed. RPAV lesion is 95% stenosed. Mid RCA lesion  is 95% stenosed. Prox LAD to Mid LAD lesion is 100% stenosed. There is moderate left ventricular systolic dysfunction. LV end diastolic pressure is moderately elevated. The left ventricular ejection fraction is 35-45% by visual estimate.   1. Critical 2 vessel obstructive CAD    - CTO of the proximal LAD. LAD fills late by left to left collaterals    - 95% thrombotic lesion in the mid RCA with distal embolization into the PL branch 2. Moderate LV dysfunction. EF 40% with severe inferior wall HK 3. Moderately elevated LVEDP 24 mm Hg.   Plan: resume IV heparin. Beta blocker, statin, ASA. Recommend CT surgery consultation for consideration of CABG.   S/p off pump CABG [place]-LAD. SVG-PDA, SVG-PL   Patient Profile     65 y.o. male  with a hx of CAD status post CABG x3 (07/2019), hyperlipidemia, HFrEF and diabetes who is being seen 11/18/2021 for the evaluation of CHF at the request of Dr. Benjamine Mola presented with decompensated HF, worsening LV fxn   Assessment & Plan    HFrEF/ICM: Echo this admission shows decline in EF to 20 to 25% with global hypokinesis, LAD territory being akinetic. LV function mildly worsened from prior and he is symptomatic with acute decompensated HF. Will recommend ischemic eval. Initial presentation was significant volume overload, elevated BNP and chest x-ray with edema.   -- Currently diuresing with IV Lasix, net -1.7L; doing well. He has no significant MR, pulmonary pressure normal.  -- Planned for [facility] tomorrow to reassess graft patency  -- Continue IV Lasix 40 mg  -- Continue coreg 6.25 mg BID -- holding losartan 50 mg for now prior to contrast dye and can restart post cath  Shared Decision Making/Informed Consent The risks [stroke (1 in 1000), death (1 in 1000), kidney failure [usually temporary] (1 in 500), bleeding (1 in 200), allergic reaction [possibly serious] (1 in 200)], benefits (diagnostic support and management of coronary artery disease) and alternatives  of a cardiac catheterization were discussed in detail with Mr. Castille and he is willing to proceed.    CAD status post CABG x3 '20: With new decline in EF there is concern regarding graft failure.  He will need to  undergo cardiac catheterization to redefine anatomy. -- Continue aspirin -- on repatha as of 6 weeks ago   Hyperlipidemia: History of statin intolerance to multiple agents. LDL responded with statin previously.  LDL 211 08/24/2021, started repatha 09/21/2021. LDL 211, TC 297 -- Most recently has been on Repatha -- LDL 18   Diabetes: On Farxiga PTA -- SSI while inpatient   Hypertension: Blood pressures are on the softer side. -- We will hold ARB in anticipation for cath as well as need for diuresis   For questions or updates, please contact [facility] Please consult [email or web address] for contact info under        Signed, Laverda Page, NP  11/19/2021, 8:59 AM

## 2021-11-19 NOTE — Plan of Care (Signed)
  Problem: Clinical Measurements: Goal: Will remain free from infection Outcome: Completed/Met

## 2021-11-19 NOTE — Progress Notes (Signed)
MD, pt may need a outpt sleep study, pt may have sleep apnea, Thanks Lavonda Jumbo RN.

## 2021-11-19 NOTE — H&P (View-Only) (Signed)
Progress Note  Patient Name: Anthony Alvarez Date of Encounter: 11/19/2021  [facility] Cardiologist: [name], MD   Subjective   No chest pain, some shortness of breath overnight.   Inpatient Medications    Scheduled Meds:  aspirin EC  81 mg Oral Daily   carvedilol  6.25 mg Oral BID WC   furosemide  40 mg Intravenous Daily   heparin  5,000 Units Subcutaneous Q8H   Continuous Infusions:  PRN Meds: acetaminophen **OR** acetaminophen, melatonin, morphine injection, ondansetron **OR** ondansetron (ZOFRAN) IV, oxyCODONE   Vital Signs    Vitals:   11/19/21 0009 11/19/21 0018 11/19/21 0429 11/19/21 0724  BP: 93/77 105/64 98/60 (!) 118/103  Pulse: 97 86 76 84  Resp: Temp: 97.9 F (36.6 C)  97.8 F (36.6 C) 98.1 F (36.7 C)  TempSrc: Oral  Oral Oral  SpO2: 92%  100% 96%  Weight:   89.4 kg   Height:        Intake/Output Summary (Last 24 hours) at 11/19/2021 0859 Last data filed at 11/19/2021 0700 Gross per 24 hour  Intake 1200 ml  Output 1400 ml  Net -200 ml   Last 3 Weights 11/19/2021 11/17/2021 11/07/2021  Weight (lbs) 197 lb 1.6 oz 198 lb 200 lb  Weight (kg) 89.404 kg 89.812 kg 90.719 kg      Telemetry    SR with LBBB - Personally Reviewed  ECG    No new tracing this morning  Physical Exam   GEN: No acute distress.   Neck: No JVD Cardiac: RRR, no murmurs, rubs, or gallops.  Respiratory: Clear to auscultation bilaterally. GI: Soft, nontender, non-distended  MS: No edema; No deformity. Neuro:  Nonfocal  Psych: Normal affect   Labs    High Sensitivity Troponin:   Recent Labs  Lab 11/07/21 1744 11/07/21 1925 11/17/21 1207 11/17/21 1710 11/17/21 2347  TROPONINIHS 18* 19* 39* 41* 42*     Chemistry Recent Labs  Lab 11/17/21 1207 11/18/21 0813 11/19/21 0555  NA 138 138 138  K 4.0 4.2 4.0  CL 108 108 106  CO2 18* 18* 20*  GLUCOSE 108* 107* 115*  BUN CREATININE 0.87 0.98 0.97  CALCIUM 9.3 9.0 9.3  MG  --  2.0   --   PROT 6.6 5.7*  --   ALBUMIN 4.1 3.5  --   AST 21 17  --   ALT 14 9  --   ALKPHOS 35* 33*  --   BILITOT 2.2* 2.5*  --   GFRNONAA >60 >60 >60  ANIONGAP Lipids  Recent Labs  Lab 11/19/21 0555  CHOL 75  TRIG 134  HDL 30*  LDLCALC 18  CHOLHDL 2.5    Hematology Recent Labs  Lab 11/17/21 1207 11/18/21 0813  WBC 10.9* 10.6*  RBC 5.92* 5.37  HGB 18.4* 16.6  HCT 53.2* 48.1  MCV 89.9 89.6  MCH 31.1 30.9  MCHC 34.6 34.5  RDW 13.2 13.3  PLT 305 280   Thyroid  Recent Labs  Lab 11/18/21 0813  TSH 0.453    BNP Recent Labs  Lab 11/17/21 2303  BNP 653.4*    DDimer No results for input(s): DDIMER in the last 168 hours.   Radiology    DG Chest 2 View  Result Date: 11/17/2021 CLINICAL DATA:  Pneumonia shortness of breath and wheezing EXAM: CHEST - 2 VIEW COMPARISON:  Chest radiograph 11/07/2021 FINDINGS: Median sternotomy wires  and mediastinal surgical clips are stable. The heart mildly enlarged, unchanged. Small bilateral pleural effusions with patchy opacities in the lung bases, right worse than left, have worsened since 11/07/2021. The upper lungs are well aerated. There is no pneumothorax A left clavicular fracture is again seen. There is no new acute osseous abnormality. IMPRESSION: Small bilateral pleural effusions with patchy opacities in the lung bases, right more than left, have worsened since 11/07/2021. Findings could reflect infection and parapneumonic effusions in the correct clinical setting. Recommend follow-up radiographs in 6-8 weeks to assess for resolution. Electronically Signed   By: Lesia Hausen M.D.   On: 11/17/2021 12:43   ECHOCARDIOGRAM COMPLETE  Result Date: 11/18/2021    ECHOCARDIOGRAM REPORT   Patient Name:   Anthony Alvarez Date of Exam: 11/18/2021 Medical Rec #:  [id number]      Height:       70.0 in Accession #:    [id number]     Weight:       198.0 lb Date of Birth:  [birth year]      BSA:          2.078 m Patient Age:    65 years        BP:           122/71 mmHg Patient Gender: M              HR:           103 bpm. Exam Location:  Inpatient Procedure: 2D Echo, 3D Echo, Color Doppler and Cardiac Doppler Indications:    R06.9 DOE  History:        Patient has prior history of Echocardiogram examinations, most                 recent 12/21/2019. Prior CABG; Risk Factors:Hypertension, Diabetes                 and Dyslipidemia.  Sonographer:    Irving Burton Senior RDCS Referring Phys: [id number] ASIA B ZIERLE-GHOSH IMPRESSIONS  1. Severe global hypokinesis. LAD territory is akinetic. Left ventricular ejection fraction, by estimation, is 20 to 25%. The left ventricle has severely decreased function. The left ventricle demonstrates global hypokinesis. The left ventricular internal cavity size was moderately dilated. Left ventricular diastolic parameters are consistent with Grade II diastolic dysfunction (pseudonormalization).  2. Right ventricular systolic function is moderately reduced. The right ventricular size is normal.  3. The mitral valve is normal in structure. Mild mitral valve regurgitation.  4. The aortic valve is grossly normal. Aortic valve regurgitation is trivial. Aortic valve sclerosis/calcification is present, without any evidence of aortic stenosis.  5. 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 Aortic dilatation noted.  6. The inferior vena cava is normal in size with greater than 50% respiratory variability, suggesting right atrial pressure of 3 mmHg. Conclusion(s)/Recommendation(s): LV function severely globally hypokinetic with LAD terriory akinetic worsened compared to prior echo 12/21/2019 . No sigificant valvular disease. FINDINGS  Left Ventricle: Severe global hypokinesis. LAD territory is akinetic. Left ventricular ejection fraction, by estimation, is 20 to 25%. The left ventricle has severely decreased function. The left ventricle demonstrates global hypokinesis. The left ventricular internal cavity size was moderately dilated. There  is no left ventricular hypertrophy. Abnormal (paradoxical) septal motion consistent with post-operative status. Left ventricular diastolic parameters are consistent with Grade II diastolic dysfunction (pseudonormalization). Right Ventricle: The right ventricular size is normal. No increase in right ventricular wall thickness. Right ventricular systolic function is moderately reduced. Left  Atrium: Left atrial size was normal in size. Right Atrium: Right atrial size was normal in size. Pericardium: There is no evidence of pericardial effusion. Mitral Valve: The mitral valve is normal in structure. Mild mitral valve regurgitation. Tricuspid Valve: The tricuspid valve is normal in structure. Tricuspid valve regurgitation is not demonstrated. Aortic Valve: The aortic valve is grossly normal. Aortic valve regurgitation is trivial. Aortic valve sclerosis/calcification is present, without any evidence of aortic stenosis. Pulmonic Valve: The pulmonic valve was not well visualized. Pulmonic valve regurgitation is not visualized. 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 Aortic dilatation noted. Venous: The inferior vena cava is normal in size with greater than 50% respiratory variability, suggesting right atrial pressure of 3 mmHg. IAS/Shunts: No atrial level shunt detected by color flow Doppler.  LEFT VENTRICLE PLAX 2D LVIDd:         5.90 cm   Diastology LVIDs:         5.30 cm   LV e' medial:    5.44 cm/s LV PW:         0.80 cm   LV E/e' medial:  6.5 LV IVS:        0.90 cm   LV e' lateral:   3.70 cm/s LVOT diam:     2.00 cm   LV E/e' lateral: 9.6 LV SV:         49 LV SV Index:   24 LVOT Area:     3.14 cm                           3D Volume EF:                          3D EF:        20 %                          LV EDV:       254 ml                          LV ESV:       204 ml                          LV SV:        51 ml RIGHT VENTRICLE RV S prime:     9.14 cm/s TAPSE (M-mode): 1.3 cm LEFT ATRIUM            Index        RIGHT ATRIUM           Index LA diam:      3.00 cm 1.44 cm/m   RA Area:     15.70 cm LA Vol (A2C): 69.8 ml 33.58 ml/m  RA Volume:   35.90 ml  17.27 ml/m LA Vol (A4C): 43.5 ml 20.93 ml/m  AORTIC VALVE LVOT Vmax:   104.00 cm/s LVOT Vmean:  70.100 cm/s LVOT VTI:    0.156 m  AORTA Ao Root diam: 4.50 cm Ao Asc diam:  3.30 cm MITRAL VALVE MV Area (PHT): 3.88 cm     SHUNTS MV Decel Time: 196 msec     Systemic VTI:  0.16 m MV E velocity: 35.35 cm/s   Systemic Diam: 2.00 cm MV A velocity: 162.00 cm/s MV E/A ratio:  0.22 Carolan Clines Electronically signed by Carolan Clines Signature Date/Time: 11/18/2021/4:28:47 PM    Final     Cardiac Studies   TTE 11/18/2021   1. Severe global hypokinesis. LAD territory is akinetic. Left ventricular ejection fraction, by estimation, is 20 to 25%. The left ventricle has severely decreased function. The left ventricle demonstrates global hypokinesis. The left ventricular  internal cavity size was moderately dilated. Left ventricular diastolic parameters are consistent with Grade II diastolic dysfunction (pseudonormalization).   2. Right ventricular systolic function is moderately reduced. The right ventricular size is normal.   3. The mitral valve is normal in structure. Mild mitral valve  regurgitation.   4. The aortic valve is grossly normal. Aortic valve regurgitation is trivial. Aortic valve sclerosis/calcification is present, without any evidence of aortic stenosis.   5. 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 Aortic dilatation noted.   6. The inferior vena cava is normal in size with greater than 50% respiratory variability, suggesting right atrial pressure of 3 mmHg.    Conclusion(s)/Recommendation(s): LV function severely globally hypokinetic with LAD terriory akinetic worsened compared to prior echo 12/21/2019 . No significant valvular disease.    LHC 08/11/2019   RV Branch lesion is 95% stenosed. RPAV lesion is 95% stenosed. Mid RCA lesion  is 95% stenosed. Prox LAD to Mid LAD lesion is 100% stenosed. There is moderate left ventricular systolic dysfunction. LV end diastolic pressure is moderately elevated. The left ventricular ejection fraction is 35-45% by visual estimate.   1. Critical 2 vessel obstructive CAD    - CTO of the proximal LAD. LAD fills late by left to left collaterals    - 95% thrombotic lesion in the mid RCA with distal embolization into the PL branch 2. Moderate LV dysfunction. EF 40% with severe inferior wall HK 3. Moderately elevated LVEDP 24 mm Hg.   Plan: resume IV heparin. Beta blocker, statin, ASA. Recommend CT surgery consultation for consideration of CABG.   S/p off pump CABG [place]-LAD. SVG-PDA, SVG-PL   Patient Profile     65 y.o. male  with a hx of CAD status post CABG x3 (07/2019), hyperlipidemia, HFrEF and diabetes who is being seen 11/18/2021 for the evaluation of CHF at the request of Dr. Benjamine Mola presented with decompensated HF, worsening LV fxn   Assessment & Plan    HFrEF/ICM: Echo this admission shows decline in EF to 20 to 25% with global hypokinesis, LAD territory being akinetic. LV function mildly worsened from prior and he is symptomatic with acute decompensated HF. Will recommend ischemic eval. Initial presentation was significant volume overload, elevated BNP and chest x-ray with edema.   -- Currently diuresing with IV Lasix, net -1.7L; doing well. He has no significant MR, pulmonary pressure normal.  -- Planned for [facility] tomorrow to reassess graft patency  -- Continue IV Lasix 40 mg  -- Continue coreg 6.25 mg BID -- holding losartan 50 mg for now prior to contrast dye and can restart post cath  Shared Decision Making/Informed Consent The risks [stroke (1 in 1000), death (1 in 1000), kidney failure [usually temporary] (1 in 500), bleeding (1 in 200), allergic reaction [possibly serious] (1 in 200)], benefits (diagnostic support and management of coronary artery disease) and alternatives  of a cardiac catheterization were discussed in detail with Mr. Castille and he is willing to proceed.    CAD status post CABG x3 '20: With new decline in EF there is concern regarding graft failure.  He will need to  undergo cardiac catheterization to redefine anatomy. -- Continue aspirin -- on repatha as of 6 weeks ago   Hyperlipidemia: History of statin intolerance to multiple agents. LDL responded with statin previously.  LDL 211 08/24/2021, started repatha 09/21/2021. LDL 211, TC 297 -- Most recently has been on Repatha -- LDL 18   Diabetes: On Farxiga PTA -- SSI while inpatient   Hypertension: Blood pressures are on the softer side. -- We will hold ARB in anticipation for cath as well as need for diuresis   For questions or updates, please contact [facility] Please consult [email or web address] for contact info under        Signed, Laverda Page, NP  11/19/2021, 8:59 AM

## 2021-11-19 NOTE — Progress Notes (Signed)
Progress Note    Maureen Duesing  JXB:[id number] DOB: 06-Jun-1956  DOA: 11/17/2021 PCP: Daisy Floro, MD    Brief Narrative:     Medical records reviewed and are as summarized below:  Breion Novacek is an 65 y.o. male  with history of hyperlipidemia, ischemic cardiomyopathy, coronary artery disease, and more presents the ED with a chief complaint of dyspnea.  Patient reports that the dyspnea started night before last.  The first night it was a 4-5 out of 10 in severity.  He had mild dyspnea during the day.  He noticed it mostly on exertion, like when he was carrying his dog up a flight of stairs.  This is an activity he is normally able to do.  EF found to be 20%.  Plan for LHC.    Assessment/Plan:   Principal Problem:   Dyspnea Active Problems:   Ischemic cardiomyopathy   Hyperlipidemia associated with type 2 diabetes mellitus (HCC)   Essential hypertension   Onychomycosis   Acute exacerbation of CHF (congestive heart failure) (HCC)   Dyspnea due to acute systolic CHF -responding to IV lasix- dose increased 12/4 -echo show decreased EF- new from 2021 -cards consult appreciated -LHC planned for Monday -losartan on hold  Hyperlipidemia -Patient has statin myopathy, resume [facility] outpatient  Diabetes mellitus type 2 -Hold Farxiga -Currently euglycemic and 180 -Add sliding scale if patient becomes hyperglycemic      Family Communication/Anticipated D/C date and plan/Code Status   DVT prophylaxis: heparin Code Status: Full Code.  Family Communication: wife at bedside Disposition Plan: Status is: Inpatient  Remains inpatient appropriate because: needs LHC         Medical Consultants:   cards    Subjective:   Having some more SOB this AM  Objective:    Vitals:   11/19/21 0018 11/19/21 0429 11/19/21 0724 11/19/21 1239  BP: 105/64 98/60 (!) 118/103 101/69  Pulse: 86 76 84 96  Resp:  Temp:  97.8 F (36.6 C) 98.1 F (36.7 C) 98.3  F (36.8 C)  TempSrc:  Oral Oral Oral  SpO2:  100% 96% 98%  Weight:  89.4 kg    Height:        Intake/Output Summary (Last 24 hours) at 11/19/2021 1321 Last data filed at 11/19/2021 1040 Gross per 24 hour  Intake 960 ml  Output 1800 ml  Net -840 ml   Filed Weights   11/17/21 2335 11/19/21 0429  Weight: 89.8 kg 89.4 kg    Exam:  General: Appearance:     Overweight male in no acute distress     Lungs:     Diminished at bases, respirations slightly labored  Heart:    Normal heart rate.    MS:   All extremities are intact.    Neurologic:   Awake, alert, oriented x 3. No apparent focal neurological           defect.      Data Reviewed:   I have personally reviewed following labs and imaging studies:  Labs: Labs show the following:   Basic Metabolic Panel: Recent Labs  Lab 11/17/21 1207 11/18/21 0813 11/19/21 0555  NA 138 138 138  K 4.0 4.2 4.0  CL 108 108 106  CO2 18* 18* 20*  GLUCOSE 108* 107* 115*  BUN CREATININE 0.87 0.98 0.97  CALCIUM 9.3 9.0 9.3  MG  --  2.0  --    GFR Estimated Creatinine Clearance:  83.2 mL/min (by C-G formula based on SCr of 0.97 mg/dL). Liver Function Tests: Recent Labs  Lab 11/17/21 1207 11/18/21 0813  AST 21 17  ALT 14 9  ALKPHOS 35* 33*  BILITOT 2.2* 2.5*  PROT 6.6 5.7*  ALBUMIN 4.1 3.5   No results for input(s): LIPASE, AMYLASE in the last 168 hours. No results for input(s): AMMONIA in the last 168 hours. Coagulation profile No results for input(s): INR, PROTIME in the last 168 hours.  CBC: Recent Labs  Lab 11/17/21 1207 11/18/21 0813  WBC 10.9* 10.6*  NEUTROABS 6.9 7.5  HGB 18.4* 16.6  HCT 53.2* 48.1  MCV 89.9 89.6  PLT 305 280   Cardiac Enzymes: No results for input(s): CKTOTAL, CKMB, CKMBINDEX, TROPONINI in the last 168 hours. BNP (last 3 results) No results for input(s): PROBNP in the last 8760 hours. CBG: No results for input(s): GLUCAP in the last 168 hours. D-Dimer: No results for  input(s): DDIMER in the last 72 hours. Hgb A1c: No results for input(s): HGBA1C in the last 72 hours. Lipid Profile: Recent Labs    11/19/21 0555  CHOL 75  HDL 30*  LDLCALC 18  TRIG 144  CHOLHDL 2.5   Thyroid function studies: Recent Labs    11/18/21 0813  TSH 0.453   Anemia work up: No results for input(s): VITAMINB12, FOLATE, FERRITIN, TIBC, IRON, RETICCTPCT in the last 72 hours. Sepsis Labs: Recent Labs  Lab 11/17/21 1207 11/17/21 2347 11/18/21 0332 11/18/21 0813 11/19/21 0555  PROCALCITON  --  <0.10 <0.10  --   --   WBC 10.9*  --   --  10.6*  --   LATICACIDVEN  --   --   --   --  1.3    Microbiology Recent Results (from the past 240 hour(s))  Resp Panel by RT-PCR (Flu A&B, Covid) Nasopharyngeal Swab     Status: None   Collection Time: 11/17/21 10:58 PM   Specimen: Nasopharyngeal Swab; Nasopharyngeal(NP) swabs in vial transport medium  Result Value Ref Range Status   SARS Coronavirus 2 by RT PCR NEGATIVE NEGATIVE Final    Comment: (NOTE) SARS-CoV-2 target nucleic acids are NOT DETECTED.  The SARS-CoV-2 RNA is generally detectable in upper respiratory specimens during the acute phase of infection. The lowest concentration of SARS-CoV-2 viral copies this assay can detect is 138 copies/mL. A negative result does not preclude SARS-Cov-2 infection and should not be used as the sole basis for treatment or other patient management decisions. A negative result may occur with  improper specimen collection/handling, submission of specimen other than nasopharyngeal swab, presence of viral mutation(s) within the areas targeted by this assay, and inadequate number of viral copies(<138 copies/mL). A negative result must be combined with clinical observations, patient history, and epidemiological information. The expected result is Negative.  Fact Sheet for Patients:  BloggerCourse.com  Fact Sheet for Healthcare Providers:   SeriousBroker.it  This test is no t yet approved or cleared by the [place] FDA and  has been authorized for detection and/or diagnosis of SARS-CoV-2 by FDA under an Emergency Use Authorization (EUA). This EUA will remain  in effect (meaning this test can be used) for the duration of the COVID-19 declaration under Section 564(b)(1) of the Act, 21 U.S.C.section 360bbb-3(b)(1), unless the authorization is terminated  or revoked sooner.       Influenza A by PCR NEGATIVE NEGATIVE Final   Influenza B by PCR NEGATIVE NEGATIVE Final    Comment: (NOTE) The Xpert Xpress SARS-CoV-2/FLU/RSV  plus assay is intended as an aid in the diagnosis of influenza from Nasopharyngeal swab specimens and should not be used as a sole basis for treatment. Nasal washings and aspirates are unacceptable for Xpert Xpress SARS-CoV-2/FLU/RSV testing.  Fact Sheet for Patients: BloggerCourse.com  Fact Sheet for Healthcare Providers: SeriousBroker.it  This test is not yet approved or cleared by the [place] FDA and has been authorized for detection and/or diagnosis of SARS-CoV-2 by FDA under an Emergency Use Authorization (EUA). This EUA will remain in effect (meaning this test can be used) for the duration of the COVID-19 declaration under Section 564(b)(1) of the Act, 21 U.S.C. section 360bbb-3(b)(1), unless the authorization is terminated or revoked.  Performed at [facility], [address], [place], [place] [address]     Procedures and diagnostic studies:  ECHOCARDIOGRAM COMPLETE  Result Date: 11/18/2021    ECHOCARDIOGRAM REPORT   Patient Name:   JORAM VENSON Date of Exam: 11/18/2021 Medical Rec #:  [id number]      Height:       70.0 in Accession #:    [id number]     Weight:       198.0 lb Date of Birth:  [birth year]      BSA:          2.078 m Patient Age:    65 years       BP:           122/71 mmHg Patient  Gender: M              HR:           103 bpm. Exam Location:  Inpatient Procedure: 2D Echo, 3D Echo, Color Doppler and Cardiac Doppler Indications:    R06.9 DOE  History:        Patient has prior history of Echocardiogram examinations, most                 recent 12/21/2019. Prior CABG; Risk Factors:Hypertension, Diabetes                 and Dyslipidemia.  Sonographer:    Irving Burton Senior RDCS Referring Phys: [id number] ASIA B ZIERLE-GHOSH IMPRESSIONS  1. Severe global hypokinesis. LAD territory is akinetic. Left ventricular ejection fraction, by estimation, is 20 to 25%. The left ventricle has severely decreased function. The left ventricle demonstrates global hypokinesis. The left ventricular internal cavity size was moderately dilated. Left ventricular diastolic parameters are consistent with Grade II diastolic dysfunction (pseudonormalization).  2. Right ventricular systolic function is moderately reduced. The right ventricular size is normal.  3. The mitral valve is normal in structure. Mild mitral valve regurgitation.  4. The aortic valve is grossly normal. Aortic valve regurgitation is trivial. Aortic valve sclerosis/calcification is present, without any evidence of aortic stenosis.  5. 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 Aortic dilatation noted.  6. The inferior vena cava is normal in size with greater than 50% respiratory variability, suggesting right atrial pressure of 3 mmHg. Conclusion(s)/Recommendation(s): LV function severely globally hypokinetic with LAD terriory akinetic worsened compared to prior echo 12/21/2019 . No sigificant valvular disease. FINDINGS  Left Ventricle: Severe global hypokinesis. LAD territory is akinetic. Left ventricular ejection fraction, by estimation, is 20 to 25%. The left ventricle has severely decreased function. The left ventricle demonstrates global hypokinesis. The left ventricular internal cavity size was moderately dilated. There is no left ventricular hypertrophy.  Abnormal (paradoxical) septal motion consistent with post-operative status. Left ventricular diastolic parameters are  consistent with Grade II diastolic dysfunction (pseudonormalization). Right Ventricle: The right ventricular size is normal. No increase in right ventricular wall thickness. Right ventricular systolic function is moderately reduced. Left Atrium: Left atrial size was normal in size. Right Atrium: Right atrial size was normal in size. Pericardium: There is no evidence of pericardial effusion. Mitral Valve: The mitral valve is normal in structure. Mild mitral valve regurgitation. Tricuspid Valve: The tricuspid valve is normal in structure. Tricuspid valve regurgitation is not demonstrated. Aortic Valve: The aortic valve is grossly normal. Aortic valve regurgitation is trivial. Aortic valve sclerosis/calcification is present, without any evidence of aortic stenosis. Pulmonic Valve: The pulmonic valve was not well visualized. Pulmonic valve regurgitation is not visualized. 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 Aortic dilatation noted. Venous: The inferior vena cava is normal in size with greater than 50% respiratory variability, suggesting right atrial pressure of 3 mmHg. IAS/Shunts: No atrial level shunt detected by color flow Doppler.  LEFT VENTRICLE PLAX 2D LVIDd:         5.90 cm   Diastology LVIDs:         5.30 cm   LV e' medial:    5.44 cm/s LV PW:         0.80 cm   LV E/e' medial:  6.5 LV IVS:        0.90 cm   LV e' lateral:   3.70 cm/s LVOT diam:     2.00 cm   LV E/e' lateral: 9.6 LV SV:         49 LV SV Index:   24 LVOT Area:     3.14 cm                           3D Volume EF:                          3D EF:        20 %                          LV EDV:       254 ml                          LV ESV:       204 ml                          LV SV:        51 ml RIGHT VENTRICLE RV S prime:     9.14 cm/s TAPSE (M-mode): 1.3 cm LEFT ATRIUM           Index        RIGHT ATRIUM            Index LA diam:      3.00 cm 1.44 cm/m   RA Area:     15.70 cm LA Vol (A2C): 69.8 ml 33.58 ml/m  RA Volume:   35.90 ml  17.27 ml/m LA Vol (A4C): 43.5 ml 20.93 ml/m  AORTIC VALVE LVOT Vmax:   104.00 cm/s LVOT Vmean:  70.100 cm/s LVOT VTI:    0.156 m  AORTA Ao Root diam: 4.50 cm Ao Asc diam:  3.30 cm MITRAL VALVE MV Area (PHT): 3.88 cm     SHUNTS MV Decel Time: 196  msec     Systemic VTI:  0.16 m MV E velocity: 35.35 cm/s   Systemic Diam: 2.00 cm MV A velocity: 162.00 cm/s MV E/A ratio:  0.22 Photographer signed by Carolan Clines Signature Date/Time: 11/18/2021/4:28:47 PM    Final     Medications:    aspirin EC  81 mg Oral Daily   carvedilol  6.25 mg Oral BID WC   [START ON 11/20/2021] furosemide  80 mg Intravenous Daily   heparin  5,000 Units Subcutaneous Q8H   potassium chloride  40 mEq Oral Daily   sodium chloride flush  3 mL Intravenous Q12H   Continuous Infusions:   LOS: 1 day   Joseph Art  [facility]   How to contact the [facility] Attending or Consulting provider 7A - 7P or covering provider during after hours 7P -7A, for this patient?  Check the care team in [facility] and look for a) attending/consulting [facility] provider listed and b) the [facility] team listed Log into [email or web address] and use [place]'s universal password to access. If you do not have the password, please contact the hospital operator. Locate the [facility] provider you are looking for under [facility] and page to a number that you can be directly reached. If you still have difficulty reaching the provider, please page the [facility] (Director on Call) for the Hospitalists listed on amion for assistance.  11/19/2021, 1:21 PM

## 2021-11-20 ENCOUNTER — Other Ambulatory Visit (HOSPITAL_COMMUNITY): Payer: Self-pay

## 2021-11-20 ENCOUNTER — Encounter (HOSPITAL_COMMUNITY): Payer: Self-pay | Admitting: Internal Medicine

## 2021-11-20 ENCOUNTER — Encounter (HOSPITAL_COMMUNITY)
Admission: EM | Disposition: A | Discharge status: Home / Self Care | Payer: Self-pay | Source: Home / Self Care | Attending: Internal Medicine

## 2021-11-20 DIAGNOSIS — R0609 Other forms of dyspnea: Secondary | ICD-10-CM

## 2021-11-20 DIAGNOSIS — I25810 Atherosclerosis of coronary artery bypass graft(s) without angina pectoris: Secondary | ICD-10-CM

## 2021-11-20 DIAGNOSIS — I5023 Acute on chronic systolic (congestive) heart failure: Secondary | ICD-10-CM

## 2021-11-20 HISTORY — PX: RIGHT/LEFT HEART CATH AND CORONARY/GRAFT ANGIOGRAPHY: CATH118267

## 2021-11-20 LAB — POCT I-STAT 7, (LYTES, BLD GAS, ICA,H+H)
Acid-base deficit: 1 mmol/L (ref 0.0–2.0)
Bicarbonate: 22.7 mmol/L (ref 20.0–28.0)
Calcium, Ion: 1.29 mmol/L (ref 1.15–1.40)
HCT: 44 % (ref 39.0–52.0)
Hemoglobin: 15 g/dL (ref 13.0–17.0)
O2 Saturation: 94 %
Potassium: 3.9 mmol/L (ref 3.5–5.1)
Sodium: 140 mmol/L (ref 135–145)
TCO2: 24 mmol/L (ref 22–32)
pCO2 arterial: 35 mmHg (ref 32.0–48.0)
pH, Arterial: 7.42 (ref 7.350–7.450)
pO2, Arterial: 70 mmHg — ABNORMAL LOW (ref 83.0–108.0)

## 2021-11-20 LAB — POCT I-STAT EG7
Acid-base deficit: 1 mmol/L (ref 0.0–2.0)
Bicarbonate: 24.3 mmol/L (ref 20.0–28.0)
Calcium, Ion: 1.25 mmol/L (ref 1.15–1.40)
HCT: 43 % (ref 39.0–52.0)
Hemoglobin: 14.6 g/dL (ref 13.0–17.0)
O2 Saturation: 66 %
Potassium: 3.8 mmol/L (ref 3.5–5.1)
Sodium: 141 mmol/L (ref 135–145)
TCO2: 26 mmol/L (ref 22–32)
pCO2, Ven: 40.6 mmHg — ABNORMAL LOW (ref 44.0–60.0)
pH, Ven: 7.385 (ref 7.250–7.430)
pO2, Ven: 35 mmHg (ref 32.0–45.0)

## 2021-11-20 LAB — BASIC METABOLIC PANEL
Anion gap: 7 (ref 5–15)
BUN: 19 mg/dL (ref 8–23)
CO2: 24 mmol/L (ref 22–32)
Calcium: 9.2 mg/dL (ref 8.9–10.3)
Chloride: 105 mmol/L (ref 98–111)
Creatinine, Ser: 1.11 mg/dL (ref 0.61–1.24)
GFR, Estimated: >60 mL/min (ref >60)
Glucose, Bld: 128 mg/dL — ABNORMAL HIGH (ref 70–99)
Potassium: 3.9 mmol/L (ref 3.5–5.1)
Sodium: 136 mmol/L (ref 135–145)

## 2021-11-20 SURGERY — RIGHT/LEFT HEART CATH AND CORONARY/GRAFT ANGIOGRAPHY
Anesthesia: LOCAL

## 2021-11-20 MED ORDER — HEPARIN (PORCINE) IN NACL 1000-0.9 UT/500ML-% IV SOLN
INTRAVENOUS | Status: AC
  Filled 2021-11-20: qty 1000

## 2021-11-20 MED ORDER — LOSARTAN POTASSIUM 25 MG PO TABS
12.5000 mg | ORAL_TABLET | Freq: Every day | ORAL | Status: DC
  Administered 2021-11-20 – 2021-11-21 (×2): 12.5 mg via ORAL
  Filled 2021-11-20 (×2): qty 1

## 2021-11-20 MED ORDER — FENTANYL CITRATE (PF) 100 MCG/2ML IJ SOLN
INTRAMUSCULAR | Status: AC
  Filled 2021-11-20: qty 2

## 2021-11-20 MED ORDER — DAPAGLIFLOZIN PROPANEDIOL 5 MG PO TABS
5.0000 mg | ORAL_TABLET | Freq: Every day | ORAL | Status: DC
  Administered 2021-11-20 – 2021-11-21 (×2): 5 mg via ORAL
  Filled 2021-11-20 (×2): qty 1

## 2021-11-20 MED ORDER — SODIUM CHLORIDE 0.9% FLUSH: 3.0000 mL | INTRAVENOUS | Status: DC | PRN

## 2021-11-20 MED ORDER — IOHEXOL 350 MG/ML SOLN
INTRAVENOUS | Status: DC | PRN
  Administered 2021-11-20: 45 mL

## 2021-11-20 MED ORDER — SODIUM CHLORIDE 0.9 % IV SOLN: 250.0000 mL | INTRAVENOUS | Status: DC | PRN

## 2021-11-20 MED ORDER — HEPARIN SODIUM (PORCINE) 1000 UNIT/ML IJ SOLN
INTRAMUSCULAR | Status: AC
  Filled 2021-11-20: qty 10

## 2021-11-20 MED ORDER — HEPARIN SODIUM (PORCINE) 1000 UNIT/ML IJ SOLN
INTRAMUSCULAR | Status: DC | PRN
  Administered 2021-11-20: 4500 [IU] via INTRAVENOUS

## 2021-11-20 MED ORDER — VERAPAMIL HCL 2.5 MG/ML IV SOLN
INTRAVENOUS | Status: AC
  Filled 2021-11-20: qty 2

## 2021-11-20 MED ORDER — LIDOCAINE HCL (PF) 1 % IJ SOLN
INTRAMUSCULAR | Status: DC | PRN
  Administered 2021-11-20 (×2): 2 mL via INTRADERMAL

## 2021-11-20 MED ORDER — SODIUM CHLORIDE 0.9% FLUSH
3.0000 mL | Freq: Two times a day (BID) | INTRAVENOUS | Status: DC
  Administered 2021-11-20 – 2021-11-21 (×2): 3 mL via INTRAVENOUS

## 2021-11-20 MED ORDER — SPIRONOLACTONE 12.5 MG HALF TABLET
12.5000 mg | ORAL_TABLET | Freq: Every day | ORAL | Status: DC
  Administered 2021-11-20 – 2021-11-21 (×2): 12.5 mg via ORAL
  Filled 2021-11-20 (×2): qty 1

## 2021-11-20 MED ORDER — HEPARIN (PORCINE) IN NACL 1000-0.9 UT/500ML-% IV SOLN
INTRAVENOUS | Status: DC | PRN
  Administered 2021-11-20 (×2): 500 mL

## 2021-11-20 MED ORDER — VERAPAMIL HCL 2.5 MG/ML IV SOLN
INTRAVENOUS | Status: DC | PRN
  Administered 2021-11-20: 10 mL via INTRA_ARTERIAL

## 2021-11-20 MED ORDER — LIDOCAINE HCL (PF) 1 % IJ SOLN
INTRAMUSCULAR | Status: AC
  Filled 2021-11-20: qty 30

## 2021-11-20 MED ORDER — ENOXAPARIN SODIUM 40 MG/0.4ML IJ SOSY
40.0000 mg | PREFILLED_SYRINGE | INTRAMUSCULAR | Status: DC
  Administered 2021-11-21: 40 mg via SUBCUTANEOUS
  Filled 2021-11-20: qty 0.4

## 2021-11-20 MED ORDER — FENTANYL CITRATE (PF) 100 MCG/2ML IJ SOLN
INTRAMUSCULAR | Status: DC | PRN
  Administered 2021-11-20: 25 ug via INTRAVENOUS

## 2021-11-20 MED ORDER — MIDAZOLAM HCL 2 MG/2ML IJ SOLN
INTRAMUSCULAR | Status: AC
  Filled 2021-11-20: qty 2

## 2021-11-20 MED ORDER — HYDRALAZINE HCL 20 MG/ML IJ SOLN: 10.0000 mg | INTRAMUSCULAR | Status: AC | PRN

## 2021-11-20 MED ORDER — MIDAZOLAM HCL 2 MG/2ML IJ SOLN
INTRAMUSCULAR | Status: DC | PRN
  Administered 2021-11-20: 1 mg via INTRAVENOUS

## 2021-11-20 SURGICAL SUPPLY — 13 items
CATH BALLN WEDGE 5F 110CM (CATHETERS) ×2 IMPLANT
CATH INFINITI 5 FR IM (CATHETERS) ×2 IMPLANT
CATH INFINITI 5 FR MPA2 (CATHETERS) ×2 IMPLANT
DEVICE RAD COMP TR BAND LRG (VASCULAR PRODUCTS) ×2 IMPLANT
ELECT DEFIB PAD ADLT CADENCE (PAD) ×2 IMPLANT
GLIDESHEATH SLEND SS 6F .021 (SHEATH) ×2 IMPLANT
GUIDEWIRE INQWIRE 1.5J.035X260 (WIRE) ×1 IMPLANT
INQWIRE 1.5J .035X260CM (WIRE) ×2
KIT HEART LEFT (KITS) ×2 IMPLANT
PACK CARDIAC CATHETERIZATION (CUSTOM PROCEDURE TRAY) ×2 IMPLANT
SHEATH GLIDE SLENDER 4/5FR (SHEATH) ×2 IMPLANT
TRANSDUCER W/STOPCOCK (MISCELLANEOUS) ×2 IMPLANT
TUBING CIL FLEX 10 FLL-RA (TUBING) ×2 IMPLANT

## 2021-11-20 NOTE — Plan of Care (Signed)
?  Problem: Education: ?Goal: Ability to demonstrate management of disease process will improve ?Outcome: Progressing ?  ?Problem: Activity: ?Goal: Capacity to carry out activities will improve ?Outcome: Progressing ?  ?Problem: Cardiac: ?Goal: Ability to achieve and maintain adequate cardiopulmonary perfusion will improve ?Outcome: Progressing ?  ?

## 2021-11-20 NOTE — Progress Notes (Signed)
Heart Failure Stewardship Pharmacist Progress Note   PCP: Daisy Floro, MD PCP-Cardiologist: [name], MD    HPI:  65 yo M with PMH of CAD (s/p CABG in 2020), HLD, HFrEF, and type 2 diabetes. He presented to the ED on 12/2 with shortness of breath. CXR with small bilateral pleural effusions with patchy opacities in the lung bases. An ECHO was done on 12/3 and LVEF was severely reduced to 20-25% with G2DD and moderately reduced RV function (45-50% in 12/2019, 35% in 07/2019). R/LHC done today with severe 2 vessel CAD with chronic occlusions of proxLAD and midRCA, both saphenous graphs occluded, patent LIMA D3, RA 4, PA 23, wedge 22, CO 4.4, CI 2.1. Recommending medical management of CAD and CHF.   Current HF Medications: Diuretic: furosemide 80 mg IV daily Beta Blocker: carvedilol 6.25 mg BID Aldosterone Antagonist: spironolactone 12.5 mg daily  Prior to admission HF Medications: Beta blocker: carvedilol 3.125 mg BID ACE/ARB/ARNI: losartan 25 mg BID SGLT2i: Farxiga 5 mg daily  Pertinent Lab Values: Serum creatinine 1.11, BUN 19, Potassium 3.9, Sodium 136, BNP 653.4, Magnesium 2.0, A1c 8.1 (07/2019)   Vital Signs: Weight: 196 lbs (admission weight: 198 lbs) Blood pressure: 140/90s  Heart rate: 80s  I/O: -2.4L yesterday; net -3.3L  Medication Assistance / Insurance Benefits Check: Does the patient have prescription insurance?  Yes Type of insurance plan: UHC Medicare  Does the patient qualify for medication assistance through manufacturers or grants?   Pending household income info  Outpatient Pharmacy:  Prior to admission outpatient [facility] Is the patient willing to use [facility] pharmacy at discharge? Yes Is the patient willing to transition their outpatient pharmacy to utilize a [facility] outpatient pharmacy?   Pending    Assessment: 1. Acute on chronic systolic CHF (EF 65-03%), due to ICM. NYHA class II symptoms. - Continue furosemide 80 mg IV daily -  Continue carvedilol 6.25 mg BID - Agree with starting Entresto 24/26 mg BID tomorrow - Agree with starting spironolactone 12.5 mg daily - Consider increasing Farxiga to 10 mg daily for HF (5 mg dose for diabetes only)   Plan: 1) Medication changes recommended at this time: - Agree with changes as above; increase Farxiga to 10 mg daily   2) Patient assistance: - Patient is in the donut hole with insurance - Entresto copay $170; Marcelline Deist copay $146 - Can use Entresto 30-day free copay card to bridge him to the new year  3)  Education  - Patient has been educated on current HF medications and potential additions to HF medication regimen - Patient verbalizes understanding that over the next few months, these medication doses may change and more medications may be added to optimize HF regimen - Patient has been educated on basic disease state pathophysiology and goals of therapy   Sharen Hones, PharmD, BCPS Heart Failure Engineer, building services Phone [phone]

## 2021-11-20 NOTE — Progress Notes (Signed)
Progress Note    Anthony Alvarez  ZOX:[id number] DOB: 11/05/1956  DOA: 11/17/2021 PCP: Daisy Floro, MD    Brief Narrative:     Medical records reviewed and are as summarized below:  Anthony Alvarez is an 65 y.o. male  with history of hyperlipidemia, ischemic cardiomyopathy, coronary artery disease, and more presents the ED with a chief complaint of dyspnea.  Patient reports that the dyspnea started night before last.  The first night it was a 4-5 out of 10 in severity.  He had mild dyspnea during the day.  He noticed it mostly on exertion, like when he was carrying his dog up a flight of stairs.  This is an activity he is normally able to do.  EF found to be 20%.  S/p LHC  Assessment/Plan:   Principal Problem:   Dyspnea Active Problems:   Ischemic cardiomyopathy   Hyperlipidemia associated with type 2 diabetes mellitus (HCC)   Essential hypertension   Onychomycosis   Acute exacerbation of CHF (congestive heart failure) (HCC)   Acute on chronic HFrEF (heart failure with reduced ejection fraction) (HCC)   Dyspnea due to acute systolic CHF -plan per cards -echo show decreased EF- new from 2021 -cards consult appreciated -s/p LHC -losartan on hold- entresto to start tomm  Hyperlipidemia -Patient has statin myopathy, resume Repatha outpatient  Diabetes mellitus type 2 -resume Farxiga -Currently euglycemic and 180 -Add sliding scale if patient becomes hyperglycemic      Family Communication/Anticipated D/C date and plan/Code Status   DVT prophylaxis: heparin Code Status: Full Code.  Family Communication: wife at bedside Disposition Plan: Status is: Inpatient  Remains inpatient appropriate because: 24-48 hours until home         Medical Consultants:   cards    Subjective:   Feeling better  Objective:    Vitals:   11/20/21 1001 11/20/21 1016 11/20/21 1032 11/20/21 1101  BP: (!) 157/102 (!) 159/91 (!) 144/89 (!) 148/91  Pulse: 91 86 87 93   Resp:      Temp:      TempSrc:      SpO2: 98% 98% 97% 100%  Weight:      Height:        Intake/Output Summary (Last 24 hours) at 11/20/2021 1505 Last data filed at 11/20/2021 1400 Gross per 24 hour  Intake 600 ml  Output 2275 ml  Net -1675 ml   Filed Weights   11/17/21 2335 11/19/21 0429 11/20/21 0415  Weight: 89.8 kg 89.4 kg 89.2 kg    Exam:  General: Appearance:     Overweight male in no acute distress     Lungs:     respirations unlabored  Heart:    Normal heart rate. .    MS:   All extremities are intact.    Neurologic:   Awake, alert, oriented x 3. No apparent focal neurological           defect.        Data Reviewed:   I have personally reviewed following labs and imaging studies:  Labs: Labs show the following:   Basic Metabolic Panel: Recent Labs  Lab 11/17/21 1207 11/18/21 0813 11/19/21 0555 11/20/21 0428  NA 138 138 138 136  K 4.0 4.2 4.0 3.9  CL 108 108 106 105  CO2 18* 18* 20* 24  GLUCOSE 108* 107* 115* 128*  BUN 17 15 17 19   CREATININE 0.87 0.98 0.97 1.11  CALCIUM 9.3 9.0 9.3 9.2  MG  --  2.0  --   --    GFR Estimated Creatinine Clearance: 72.6 mL/min (by C-G formula based on SCr of 1.11 mg/dL). Liver Function Tests: Recent Labs  Lab 11/17/21 1207 11/18/21 0813  AST 21 17  ALT 14 9  ALKPHOS 35* 33*  BILITOT 2.2* 2.5*  PROT 6.6 5.7*  ALBUMIN 4.1 3.5   No results for input(s): LIPASE, AMYLASE in the last 168 hours. No results for input(s): AMMONIA in the last 168 hours. Coagulation profile No results for input(s): INR, PROTIME in the last 168 hours.  CBC: Recent Labs  Lab 11/17/21 1207 11/18/21 0813  WBC 10.9* 10.6*  NEUTROABS 6.9 7.5  HGB 18.4* 16.6  HCT 53.2* 48.1  MCV 89.9 89.6  PLT 305 280   Cardiac Enzymes: No results for input(s): CKTOTAL, CKMB, CKMBINDEX, TROPONINI in the last 168 hours. BNP (last 3 results) No results for input(s): PROBNP in the last 8760 hours. CBG: No results for input(s): GLUCAP in the  last 168 hours. D-Dimer: No results for input(s): DDIMER in the last 72 hours. Hgb A1c: No results for input(s): HGBA1C in the last 72 hours. Lipid Profile: Recent Labs    11/19/21 0555  CHOL 75  HDL 30*  LDLCALC 18  TRIG 867  CHOLHDL 2.5   Thyroid function studies: Recent Labs    11/18/21 0813  TSH 0.453   Anemia work up: No results for input(s): VITAMINB12, FOLATE, FERRITIN, TIBC, IRON, RETICCTPCT in the last 72 hours. Sepsis Labs: Recent Labs  Lab 11/17/21 1207 11/17/21 2347 11/18/21 0332 11/18/21 0813 11/19/21 0555  PROCALCITON  --  <0.10 <0.10  --   --   WBC 10.9*  --   --  10.6*  --   LATICACIDVEN  --   --   --   --  1.3    Microbiology Recent Results (from the past 240 hour(s))  Resp Panel by RT-PCR (Flu A&B, Covid) Nasopharyngeal Swab     Status: None   Collection Time: 11/17/21 10:58 PM   Specimen: Nasopharyngeal Swab; Nasopharyngeal(NP) swabs in vial transport medium  Result Value Ref Range Status   SARS Coronavirus 2 by RT PCR NEGATIVE NEGATIVE Final    Comment: (NOTE) SARS-CoV-2 target nucleic acids are NOT DETECTED.  The SARS-CoV-2 RNA is generally detectable in upper respiratory specimens during the acute phase of infection. The lowest concentration of SARS-CoV-2 viral copies this assay can detect is 138 copies/mL. A negative result does not preclude SARS-Cov-2 infection and should not be used as the sole basis for treatment or other patient management decisions. A negative result may occur with  improper specimen collection/handling, submission of specimen other than nasopharyngeal swab, presence of viral mutation(s) within the areas targeted by this assay, and inadequate number of viral copies(<138 copies/mL). A negative result must be combined with clinical observations, patient history, and epidemiological information. The expected result is Negative.  Fact Sheet for Patients:  BloggerCourse.com  Fact Sheet for  Healthcare Providers:  SeriousBroker.it  This test is no t yet approved or cleared by the [place] FDA and  has been authorized for detection and/or diagnosis of SARS-CoV-2 by FDA under an Emergency Use Authorization (EUA). This EUA will remain  in effect (meaning this test can be used) for the duration of the COVID-19 declaration under Section 564(b)(1) of the Act, 21 U.S.C.section 360bbb-3(b)(1), unless the authorization is terminated  or revoked sooner.       Influenza A by PCR NEGATIVE NEGATIVE Final   Influenza B by  PCR NEGATIVE NEGATIVE Final    Comment: (NOTE) The Xpert Xpress SARS-CoV-2/FLU/RSV plus assay is intended as an aid in the diagnosis of influenza from Nasopharyngeal swab specimens and should not be used as a sole basis for treatment. Nasal washings and aspirates are unacceptable for Xpert Xpress SARS-CoV-2/FLU/RSV testing.  Fact Sheet for Patients: BloggerCourse.com  Fact Sheet for Healthcare Providers: SeriousBroker.it  This test is not yet approved or cleared by the [place] FDA and has been authorized for detection and/or diagnosis of SARS-CoV-2 by FDA under an Emergency Use Authorization (EUA). This EUA will remain in effect (meaning this test can be used) for the duration of the COVID-19 declaration under Section 564(b)(1) of the Act, 21 U.S.C. section 360bbb-3(b)(1), unless the authorization is terminated or revoked.  Performed at [facility], [address], [place], [place] [address]     Procedures and diagnostic studies:  CARDIAC CATHETERIZATION  Result Date: 11/20/2021 Conclusions: Severe two-vessel coronary artery disease with chronic total occlusions of proximal LAD and mid RCA.  There is also moderate diffuse disease of the distal LCx. Widely patent LIMA-D3, which backfills the mid and distal LAD. Chronically occluded SVG-rPDA and SVG-rPL.  Mildly-moderately elevated left heart filling pressure. Normal right heart filling pressure. Mildly reduced Fick cardiac output. Recommendations: Continue aggressive secondary prevention of coronary artery disease. Gentle diuresis. Escalate goal-directed medical therapy of HFrEF. Yvonne Kendall, MD [facility]   Medications:    aspirin EC  81 mg Oral Daily   carvedilol  6.25 mg Oral BID WC   [START ON 11/21/2021] enoxaparin (LOVENOX) injection  40 mg Subcutaneous Q24H   furosemide  80 mg Intravenous Daily   losartan  12.5 mg Oral Daily   potassium chloride  40 mEq Oral Daily   sodium chloride flush  3 mL Intravenous Q12H   sodium chloride flush  3 mL Intravenous Q12H   spironolactone  12.5 mg Oral Daily   Continuous Infusions:  sodium chloride       LOS: 2 days   Joseph Art  [facility]   How to contact the [facility] Attending or Consulting provider 7A - 7P or covering provider during after hours 7P -7A, for this patient?  Check the care team in [facility] and look for a) attending/consulting [facility] provider listed and b) the [facility] team listed Log into [email or web address] and use [place]'s universal password to access. If you do not have the password, please contact the hospital operator. Locate the [facility] provider you are looking for under [facility] and page to a number that you can be directly reached. If you still have difficulty reaching the provider, please page the [facility] (Director on Call) for the Hospitalists listed on amion for assistance.  11/20/2021, 3:05 PM

## 2021-11-20 NOTE — Interval H&P Note (Signed)
History and Physical Interval Note:  11/20/2021 8:29 AM  Anthony Alvarez  has presented today for surgery, with the diagnosis of unstable angina and acute on chronic HFrEF.  The various methods of treatment have been discussed with the patient and family. After consideration of risks, benefits and other options for treatment, the patient has consented to  Procedure(s): RIGHT/LEFT HEART CATH AND CORONARY/GRAFT ANGIOGRAPHY (N/A) as a surgical intervention.  The patient's history has been reviewed, patient examined, no change in status, stable for surgery.  I have reviewed the patient's chart and labs.  Questions were answered to the patient's satisfaction.    Cath Lab Visit (complete for each Cath Lab visit)  Clinical Evaluation Leading to the Procedure:   ACS: Yes.    Non-ACS:  N/A  [name]

## 2021-11-20 NOTE — TOC Initial Note (Addendum)
Transition of Care [facility]) - Initial/Assessment Note    Patient Details  Name: Anthony Alvarez MRN: [id number] Date of Birth: 1956/02/16  Transition of [facility]) CM/SW Contact:    Elliot Cousin, RN Phone Number: 11/20/2021, 5:02 PM  Clinical Narrative:                  HF TOC CM spoke to pt and wife at bedside. Pt states he is independent prior to hospitalization. He has scale and does record weight. Pt states he monitors his sodium intake and does not eat processed food. He has been on a low carb diet for the past few years and has lost over 30 lbs. Pt takesFarxiga prior to hospital stay. Will continue to follow for dc needs.     Expected Discharge Plan: Home/Self Care Barriers to Discharge: No Barriers Identified   Patient Goals and CMS Choice        Expected Discharge Plan and Services Expected Discharge Plan: Home/Self Care   Discharge Planning Services: CM Consult   Prior Living Arrangements/Services   Lives with:: Spouse          Need for Family Participation in Patient Care: No (Comment) Care giver support system in place?: No (comment) Current home services: DME (scale, glucometer) Criminal Activity/Legal Involvement Pertinent to Current Situation/Hospitalization: No - Comment as needed  Activities of Daily Living Home Assistive Devices/Equipment: None ADL Screening (condition at time of admission) Patient's cognitive ability adequate to safely complete daily activities?: Yes Is the patient deaf or have difficulty hearing?: No Does the patient have difficulty seeing, even when wearing glasses/contacts?: No Does the patient have difficulty concentrating, remembering, or making decisions?: No Patient able to express need for assistance with ADLs?: Yes Does the patient have difficulty dressing or bathing?: No Independently performs ADLs?: Yes (appropriate for developmental age) Does the patient have difficulty walking or climbing stairs?: No Weakness of  Legs: None Weakness of Arms/Hands: None  Permission Sought/Granted Permission sought to share information with : Case Manager, PCP, Family Supports Permission granted to share information with : Yes, Verbal Permission Granted  Share Information with NAME: Anthony Alvarez     Permission granted to share info w Relationship: wife  Permission granted to share info w Contact Information: [phone]  Emotional Assessment Appearance:: Appears stated age Attitude/Demeanor/Rapport: Gracious, Engaged Affect (typically observed): Accepting Orientation: : Oriented to Self, Oriented to Place, Oriented to  Time, Oriented to Situation   Psych Involvement: No (comment)  Admission diagnosis:  Orthopnea [R06.01] Dyspnea [R06.00] Acute exacerbation of CHF (congestive heart failure) (HCC) [I50.9] Influenza [J11.1] Acute on chronic congestive heart failure, unspecified heart failure type Scnetx) [I50.9] Patient Active Problem List   Diagnosis Date Noted   Acute on chronic HFrEF (heart failure with reduced ejection fraction) (HCC)    Onychomycosis 11/18/2021   Acute exacerbation of CHF (congestive heart failure) (HCC) 11/18/2021   Dyspnea 11/17/2021   Adverse reaction to statin medication 09/21/2021   Elevated prostate specific antigen (PSA) 05/30/2021   Hypertriglyceridemia 05/29/2021   Statin myopathy 05/29/2021   Essential hypertension 12/18/2020   Ischemic cardiomyopathy 11/30/2019   Diabetes mellitus type II, non insulin dependent (HCC) 11/30/2019   Hyperlipidemia associated with type 2 diabetes mellitus (HCC) 11/30/2019   S/P Off Pump CABG x 3 08/13/2019   Multivessel CAD - 100% LAD (with Diag), 99% mRCA (subtotal occlusio) 08/12/2019   NSTEMI (non-ST elevated myocardial infarction) (HCC) 08/10/2019   PCP:  Daisy Floro, MD Pharmacy:   [facility] [address] [place] [address] Phone: [phone] Fax: [phone]     Social Determinants of  Health (SDOH) Interventions    Readmission Risk Interventions No flowsheet data found.

## 2021-11-20 NOTE — Progress Notes (Addendum)
Progress Note  Patient Name: Anthony Alvarez Date of Encounter: 11/20/2021  [facility] Cardiologist: [name], MD   Subjective   No chest pain. Breathing stable.   Inpatient Medications    Scheduled Meds:  aspirin EC  81 mg Oral Daily   carvedilol  6.25 mg Oral BID WC   furosemide  80 mg Intravenous Daily   heparin  5,000 Units Subcutaneous Q8H   potassium chloride  40 mEq Oral Daily   sodium chloride flush  3 mL Intravenous Q12H   spironolactone  12.5 mg Oral Daily   Continuous Infusions:  PRN Meds: acetaminophen **OR** acetaminophen, melatonin, morphine injection, ondansetron **OR** ondansetron (ZOFRAN) IV, oxyCODONE   Vital Signs    Vitals:   11/20/21 0856 11/20/21 0901 11/20/21 0906 11/20/21 0930  BP: 107/66 111/66 112/72 133/80  Pulse: (!) 239 99 100 81  Resp: 12 14 15 18   Temp:    97.9 F (36.6 C)  TempSrc:    Oral  SpO2: 97% 96% 96% 97%  Weight:      Height:        Intake/Output Summary (Last 24 hours) at 11/20/2021 1049 Last data filed at 11/20/2021 1000 Gross per 24 hour  Intake 1080 ml  Output 1375 ml  Net -295 ml   Last 3 Weights 11/20/2021 11/19/2021 11/17/2021  Weight (lbs) 196 lb 11.2 oz 197 lb 1.6 oz 198 lb  Weight (kg) 89.223 kg 89.404 kg 89.812 kg      Telemetry    SR - Personally Reviewed  ECG    N/A  Physical Exam   GEN: No acute distress.   Neck: No JVD Cardiac: RRR, no murmurs, rubs, or gallops. L radial with TR band  Respiratory: Clear to auscultation bilaterally. GI: Soft, nontender, non-distended  MS: No edema; No deformity. Neuro:  Nonfocal  Psych: Normal affect   Labs    High Sensitivity Troponin:   Recent Labs  Lab 11/07/21 1744 11/07/21 1925 11/17/21 1207 11/17/21 1710 11/17/21 2347  TROPONINIHS 18* 19* 39* 41* 42*     Chemistry Recent Labs  Lab 11/17/21 1207 11/18/21 0813 11/19/21 0555 11/20/21 0428  NA 138 138 138 136  K 4.0 4.2 4.0 3.9  CL 108 108 106 105  CO2 18* 18* 20* 24  GLUCOSE  108* 107* 115* 128*  BUN 17 15 17 19   CREATININE 0.87 0.98 0.97 1.11  CALCIUM 9.3 9.0 9.3 9.2  MG  --  2.0  --   --   PROT 6.6 5.7*  --   --   ALBUMIN 4.1 3.5  --   --   AST 21 17  --   --   ALT 14 9  --   --   ALKPHOS 35* 33*  --   --   BILITOT 2.2* 2.5*  --   --   GFRNONAA >60 >60 >60 >60  ANIONGAP 12 12 12 7     Lipids  Recent Labs  Lab 11/19/21 0555  CHOL 75  TRIG 134  HDL 30*  LDLCALC 18  CHOLHDL 2.5    Hematology Recent Labs  Lab 11/17/21 1207 11/18/21 0813  WBC 10.9* 10.6*  RBC 5.92* 5.37  HGB 18.4* 16.6  HCT 53.2* 48.1  MCV 89.9 89.6  MCH 31.1 30.9  MCHC 34.6 34.5  RDW 13.2 13.3  PLT 305 280   Thyroid  Recent Labs  Lab 11/18/21 0813  TSH 0.453    BNP Recent Labs  Lab 11/17/21 2303  BNP 653.4*  DDimer No results for input(s): DDIMER in the last 168 hours.   Radiology    CARDIAC CATHETERIZATION  Result Date: 11/20/2021 Conclusions: Severe two-vessel coronary artery disease with chronic total occlusions of proximal LAD and mid RCA.  There is also moderate diffuse disease of the distal LCx. Widely patent LIMA-D3, which backfills the mid and distal LAD. Chronically occluded SVG-rPDA and SVG-rPL. Mildly-moderately elevated left heart filling pressure. Normal right heart filling pressure. Mildly reduced Fick cardiac output. Recommendations: Continue aggressive secondary prevention of coronary artery disease. Gentle diuresis. Escalate goal-directed medical therapy of HFrEF. Yvonne Kendall, MD [facility]   Cardiac Studies   Echo 11/18/21 1. Severe global hypokinesis. LAD territory is akinetic. Left ventricular  ejection fraction, by estimation, is 20 to 25%. The left ventricle has  severely decreased function. The left ventricle demonstrates global  hypokinesis. The left ventricular  internal cavity size was moderately dilated. Left ventricular diastolic  parameters are consistent with Grade II diastolic dysfunction  (pseudonormalization).   2.  Right ventricular systolic function is moderately reduced. The right  ventricular size is normal.   3. The mitral valve is normal in structure. Mild mitral valve  regurgitation.   4. The aortic valve is grossly normal. Aortic valve regurgitation is  trivial. Aortic valve sclerosis/calcification is present, without any  evidence of aortic stenosis.   5. 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 Aortic dilatation  noted.   6. The inferior vena cava is normal in size with greater than 50%  respiratory variability, suggesting right atrial pressure of 3 mmHg.   Conclusion(s)/Recommendation(s): LV function severely globally hypokinetic  with LAD terriory akinetic worsened compared to prior echo 12/21/2019 . No  sigificant valvular disease  RIGHT/LEFT HEART CATH AND CORONARY/GRAFT ANGIOGRAPHY   Conclusion  Conclusions: Severe two-vessel coronary artery disease with chronic total occlusions of proximal LAD and mid RCA.  There is also moderate diffuse disease of the distal LCx. Widely patent LIMA-D3, which backfills the mid and distal LAD. Chronically occluded SVG-rPDA and SVG-rPL. Mildly-moderately elevated left heart filling pressure. Normal right heart filling pressure. Mildly reduced Fick cardiac output.   Recommendations: Continue aggressive secondary prevention of coronary artery disease. Gentle diuresis. Escalate goal-directed medical therapy of HFrEF.   Diagnostic Dominance: Right  Patient Profile     65 y.o. male  with a hx of CAD status post CABG x3 (07/2019), hyperlipidemia, HFrEF and diabetes seen for CHF.   Assessment & Plan    Acute systolic CHF /ICM - Echo this admission showed newly decline in LVEF at 20-25% with WM abnormality - BNP elevated with volume overload on presentation  - Getting IV lasix  >> diuresed 3.3L>> elevated LVEDP by cath >  continue diuresis - Continue Coreg - Start Cleda Daub 12.5mg  qd - Losartan on hold >> if stable renal function, start  Entresto tomorrow - resume Farxiga   2. CAD s/p CABG - Cath report as above recommending medical therapy - Continue ASA  and coreg - Hx of statin intolerance >> On repatha at home     For questions or updates, please contact [facility] Please consult [email or web address] for contact info under        SignedManson Passey, PA  11/20/2021, 10:49 AM     Patient seen and examined. Agree with assessment and plan.  Patient is back from the cardiac catheterization laboratory.  I have thoroughly reviewed the angiographic images and findings.  EF is significantly depressed at 25% on recent assessment.  He has  occluded both saphenous vein grafts.  His proximal LAD and mid RCA are totally occluded.  His LIMA fills his LAD system and extensively collateralizes the PDA.  His distal circumflex has 50% stenosis.  Retrospectively, the patient has noticed increasing shortness of breath with decreased activity.  We will plan to transition to [facility] tomorrow, losartan currently on hold.  Will initiate aldosterone blockade with spironolactone.  Patient will resume SGLT2 inhibition with [place].  Hopefully with guideline directed medical therapy which is optimized LV function will improve.  If not he may be a candidate for future CRT.  Continue outpatient Repatha with his history of statin intolerance with target LDL less than 55.  I answered questions of both he and his wife.   Lennette Bihari, MD, [facility] 11/20/2021 11:13 AM

## 2021-11-20 NOTE — Progress Notes (Signed)
Discussed HF booklet and exercise with pt and wife. Gave extra low sodium diets. Very receptive. They had already made diet changes for his DM. Will refer to [place] CRPII.  [id number] Ethelda Chick CES, ACSM 1:33 PM 11/20/2021

## 2021-11-20 NOTE — Progress Notes (Signed)
Mr Pursell is a 65 year old with newly diagnosed HFrEF and has EF 20-25%. Had cath today.   Appropriate for HF TOC clinic at d/c and he is agreeable.   Discussed with him and his wife. Lots of medication questions related to cost and potential side effects. I have asked HF Pharmacy Steward to meet with him.     I will set up follow in the HF [facility] clinic next week.   Jalayne Ganesh NP-C  /11:37 AM

## 2021-11-21 ENCOUNTER — Other Ambulatory Visit (HOSPITAL_COMMUNITY): Payer: Self-pay

## 2021-11-21 DIAGNOSIS — I25708 Atherosclerosis of coronary artery bypass graft(s), unspecified, with other forms of angina pectoris: Secondary | ICD-10-CM

## 2021-11-21 LAB — BASIC METABOLIC PANEL
Anion gap: 10 (ref 5–15)
BUN: 23 mg/dL (ref 8–23)
CO2: 25 mmol/L (ref 22–32)
Calcium: 9.6 mg/dL (ref 8.9–10.3)
Chloride: 102 mmol/L (ref 98–111)
Creatinine, Ser: 1.08 mg/dL (ref 0.61–1.24)
GFR, Estimated: >60 mL/min (ref >60)
Glucose, Bld: 106 mg/dL — ABNORMAL HIGH (ref 70–99)
Potassium: 3.8 mmol/L (ref 3.5–5.1)
Sodium: 137 mmol/L (ref 135–145)

## 2021-11-21 MED ORDER — DAPAGLIFLOZIN PROPANEDIOL 10 MG PO TABS
10.0000 mg | ORAL_TABLET | Freq: Every day | ORAL | 0 refills | Status: DC
  Filled 2021-11-21: qty 30, 30d supply, fill #0

## 2021-11-21 MED ORDER — CARVEDILOL 6.25 MG PO TABS
6.2500 mg | ORAL_TABLET | Freq: Two times a day (BID) | ORAL | 0 refills | Status: DC
  Filled 2021-11-21: qty 60, 30d supply, fill #0

## 2021-11-21 MED ORDER — FUROSEMIDE 40 MG PO TABS
40.0000 mg | ORAL_TABLET | Freq: Every day | ORAL | 0 refills | Status: DC
  Filled 2021-11-21: qty 30, 30d supply, fill #0

## 2021-11-21 MED ORDER — SACUBITRIL-VALSARTAN 24-26 MG PO TABS
1.0000 | ORAL_TABLET | Freq: Two times a day (BID) | ORAL | 1 refills | Status: DC
  Filled 2021-11-21: qty 60, 30d supply, fill #0

## 2021-11-21 MED ORDER — SPIRONOLACTONE 25 MG PO TABS
12.5000 mg | ORAL_TABLET | Freq: Every day | ORAL | 0 refills | Status: DC
  Filled 2021-11-21: qty 30, 60d supply, fill #0

## 2021-11-21 MED ORDER — FUROSEMIDE 40 MG PO TABS: 40.0000 mg | ORAL_TABLET | Freq: Every day | ORAL | Status: DC

## 2021-11-21 MED ORDER — SACUBITRIL-VALSARTAN 24-26 MG PO TABS: 1.0000 | ORAL_TABLET | Freq: Two times a day (BID) | ORAL | Status: DC

## 2021-11-21 MED ORDER — DAPAGLIFLOZIN PROPANEDIOL 10 MG PO TABS: 10.0000 mg | ORAL_TABLET | Freq: Every day | ORAL | Status: DC

## 2021-11-21 NOTE — Progress Notes (Signed)
Heart Failure Nurse Navigator Progress Note  Visited pt regarding HV TOC follow up. Gave appt reminder card. Answered any last minute questions, answered. Spouse at bedside. Pt questioning when he will be DC. Per bedside RN, pt awaiting TOC pharmacy to bring meds to bedside. Then discharge AVS instructions.  Ozella Rocks, MSN, RN Heart Failure Nurse Navigator [phone]

## 2021-11-21 NOTE — Progress Notes (Addendum)
Progress Note  Patient Name: Anthony Alvarez Date of Encounter: 11/21/2021  [facility] Cardiologist: [name], MD   Subjective   Feeling well. No chest pain, sob or palpitations.    Inpatient Medications    Scheduled Meds:  aspirin EC  81 mg Oral Daily   carvedilol  6.25 mg Oral BID WC   [START ON 11/22/2021] dapagliflozin propanediol  10 mg Oral Daily   enoxaparin (LOVENOX) injection  40 mg Subcutaneous Q24H   furosemide  80 mg Intravenous Daily   losartan  12.5 mg Oral Daily   potassium chloride  40 mEq Oral Daily   sodium chloride flush  3 mL Intravenous Q12H   sodium chloride flush  3 mL Intravenous Q12H   spironolactone  12.5 mg Oral Daily   Continuous Infusions:  sodium chloride     PRN Meds: sodium chloride, acetaminophen **OR** acetaminophen, melatonin, morphine injection, ondansetron **OR** ondansetron (ZOFRAN) IV, oxyCODONE, sodium chloride flush   Vital Signs    Vitals:   11/20/21 1502 11/20/21 1936 11/21/21 0344 11/21/21 0800  BP: (!) 144/76 112/65 (!) 102/57 110/64  Pulse: 88 79 79 77  Resp:  (!) 22 18   Temp:  97.7 F (36.5 C) 97.8 F (36.6 C)   TempSrc:  Oral Oral   SpO2: 97% 96% 97%   Weight:   88 kg   Height:        Intake/Output Summary (Last 24 hours) at 11/21/2021 1043 Last data filed at 11/21/2021 0700 Gross per 24 hour  Intake 920 ml  Output 2225 ml  Net -1305 ml   Last 3 Weights 11/21/2021 11/20/2021 11/19/2021  Weight (lbs) 194 lb 196 lb 11.2 oz 197 lb 1.6 oz  Weight (kg) 87.998 kg 89.223 kg 89.404 kg      Telemetry    Sinus rhythm  - Personally Reviewed  ECG    N/A  Physical Exam   GEN: No acute distress.   Neck: No JVD Cardiac: RRR, no murmurs, rubs, or gallops.  Respiratory: Clear to auscultation bilaterally. GI: Soft, nontender, non-distended  MS: No edema; No deformity. Neuro:  Nonfocal  Psych: Normal affect   Labs    High Sensitivity Troponin:   Recent Labs  Lab 11/07/21 1744 11/07/21 1925  11/17/21 1207 11/17/21 1710 11/17/21 2347  TROPONINIHS 18* 19* 39* 41* 42*     Chemistry Recent Labs  Lab 11/17/21 1207 11/18/21 0813 11/19/21 0555 11/20/21 0428 11/20/21 0847 11/20/21 0850 11/21/21 0233  NA 138 138 138 136 141 140 137  K 4.0 4.2 4.0 3.9 3.8 3.9 3.8  CL 108 108 106 105  --   --  102  CO2 18* 18* 20* 24  --   --  25  GLUCOSE 108* 107* 115* 128*  --   --  106*  BUN 17 15 17 19   --   --  23  CREATININE 0.87 0.98 0.97 1.11  --   --  1.08  CALCIUM 9.3 9.0 9.3 9.2  --   --  9.6  MG  --  2.0  --   --   --   --   --   PROT 6.6 5.7*  --   --   --   --   --   ALBUMIN 4.1 3.5  --   --   --   --   --   AST 21 17  --   --   --   --   --   ALT  14 9  --   --   --   --   --   ALKPHOS 35* 33*  --   --   --   --   --   BILITOT 2.2* 2.5*  --   --   --   --   --   GFRNONAA >60 >60 >60 >60  --   --  >60  ANIONGAP 12 12 12 7   --   --  10    Lipids  Recent Labs  Lab 11/19/21 0555  CHOL 75  TRIG 134  HDL 30*  LDLCALC 18  CHOLHDL 2.5    Hematology Recent Labs  Lab 11/17/21 1207 11/18/21 0813 11/20/21 0847 11/20/21 0850  WBC 10.9* 10.6*  --   --   RBC 5.92* 5.37  --   --   HGB 18.4* 16.6 14.6 15.0  HCT 53.2* 48.1 43.0 44.0  MCV 89.9 89.6  --   --   MCH 31.1 30.9  --   --   MCHC 34.6 34.5  --   --   RDW 13.2 13.3  --   --   PLT 305 280  --   --    Thyroid  Recent Labs  Lab 11/18/21 0813  TSH 0.453    BNP Recent Labs  Lab 11/17/21 2303  BNP 653.4*     Radiology    CARDIAC CATHETERIZATION  Result Date: 11/20/2021 Conclusions: Severe two-vessel coronary artery disease with chronic total occlusions of proximal LAD and mid RCA.  There is also moderate diffuse disease of the distal LCx. Widely patent LIMA-D3, which backfills the mid and distal LAD. Chronically occluded SVG-rPDA and SVG-rPL. Mildly-moderately elevated left heart filling pressure. Normal right heart filling pressure. Mildly reduced Fick cardiac output. Recommendations: Continue aggressive  secondary prevention of coronary artery disease. Gentle diuresis. Escalate goal-directed medical therapy of HFrEF. 14/04/2021, MD [facility]   Cardiac Studies   Echo 11/18/21 1. Severe global hypokinesis. LAD territory is akinetic. Left ventricular  ejection fraction, by estimation, is 20 to 25%. The left ventricle has  severely decreased function. The left ventricle demonstrates global  hypokinesis. The left ventricular  internal cavity size was moderately dilated. Left ventricular diastolic  parameters are consistent with Grade II diastolic dysfunction  (pseudonormalization).   2. Right ventricular systolic function is moderately reduced. The right  ventricular size is normal.   3. The mitral valve is normal in structure. Mild mitral valve  regurgitation.   4. The aortic valve is grossly normal. Aortic valve regurgitation is  trivial. Aortic valve sclerosis/calcification is present, without any  evidence of aortic stenosis.   5. 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 Aortic dilatation  noted.   6. The inferior vena cava is normal in size with greater than 50%  respiratory variability, suggesting right atrial pressure of 3 mmHg.   Conclusion(s)/Recommendation(s): LV function severely globally hypokinetic  with LAD terriory akinetic worsened compared to prior echo 12/21/2019 . No  sigificant valvular disease   RIGHT/LEFT HEART CATH AND CORONARY/GRAFT ANGIOGRAPHY    Conclusion   Conclusions: Severe two-vessel coronary artery disease with chronic total occlusions of proximal LAD and mid RCA.  There is also moderate diffuse disease of the distal LCx. Widely patent LIMA-D3, which backfills the mid and distal LAD. Chronically occluded SVG-rPDA and SVG-rPL. Mildly-moderately elevated left heart filling pressure. Normal right heart filling pressure. Mildly reduced Fick cardiac output.   Recommendations: Continue aggressive secondary prevention of coronary artery  disease. Gentle diuresis. Escalate goal-directed medical therapy of HFrEF.     Diagnostic Dominance: Right    Patient Profile     65 y.o. male  with a hx of CAD status post CABG x3 (07/2019), hyperlipidemia, HFrEF and diabetes seen for CHF.   Assessment & Plan    Acute systolic CHF /ICM - Echo this admission showed newly decline in LVEF at 20-25% with WM abnormality - BNP elevated with volume overload on presentation  - Getting IV lasix  >>elevated LVEDP by cath >  Diuresed > 5L.  - Weight down 198>>194lb - Continue Coreg 6.25mg  BID - Continue Spiro 12.5mg  qd - on Losartan 12.5mg  qd >> start Entresto tomorrow at 24/26mg  BID - Increase  Farxiga to 10mg  qd - Stop IV lasix >> start Lasix 40mg  qd tomorrow    2. CAD s/p CABG - Cath report as above recommending medical therapy - Continue ASA  and coreg - Hx of statin intolerance >> On repatha at home   [facility] will sign off.   Medication Recommendations:  as above  Other recommendations (labs, testing, etc):  BMP at follow up Follow up as an outpatient:  TOC Heart failure clinic   For questions or updates, please contact [facility] Please consult [email or web address] for contact info under        Signed , PA  11/21/2021, 10:43 AM      Patient seen and examined. Agree with assessment and plan. Patient feels well; no recurrent chest pain. Cath data again reviewed with pt and wife. To transition to entresto tomorrow since losartan was given this am. Manson Passey dose increased to 10 mg; pt on carvedilol and spironolactone.  OK for dc today with probable dose titration as outpatient as hemodynamics allow.    14/05/2021, MD, [facility] 11/21/2021 11:16 AM     Patient seen and examined. Agree with assessment and plan.   [facility], MD, [facility] 11/21/2021 11:06 AM

## 2021-11-21 NOTE — Plan of Care (Signed)
  Problem: Education: Goal: Ability to demonstrate management of disease process will improve Outcome: Adequate for Discharge   Problem: Activity: Goal: Capacity to carry out activities will improve Outcome: Adequate for Discharge   Problem: Cardiac: Goal: Ability to achieve and maintain adequate cardiopulmonary perfusion will improve Outcome: Adequate for Discharge

## 2021-11-21 NOTE — Progress Notes (Signed)
Pt sts he ambulated hall last night without CP or SOB. Feels well and no further questions. [phone] Ethelda Chick CES, ACSM 11:46 AM 11/21/2021

## 2021-11-21 NOTE — Progress Notes (Signed)
Heart Failure Stewardship Pharmacist Progress Note   PCP: Daisy Floro, MD PCP-Cardiologist: [name], MD    HPI:  65 yo M with PMH of CAD (s/p CABG in 2020), HLD, HFrEF, and type 2 diabetes. He presented to the ED on 12/2 with shortness of breath. CXR with small bilateral pleural effusions with patchy opacities in the lung bases. An ECHO was done on 12/3 and LVEF was severely reduced to 20-25% with G2DD and moderately reduced RV function (45-50% in 12/2019, 35% in 07/2019). R/LHC done today with severe 2 vessel CAD with chronic occlusions of proxLAD and midRCA, both saphenous graphs occluded, patent LIMA D3, RA 4, PA 23, wedge 22, CO 4.4, CI 2.1. Recommending medical management of CAD and CHF.   Current HF Medications: Diuretic: furosemide 80 mg IV daily Beta Blocker: carvedilol 6.25 mg BID ACE/ARB/ARNI: losartan 12.5 mg daily Aldosterone Antagonist: spironolactone 12.5 mg daily SGLT2i: Farxiga 5 mg daily  Prior to admission HF Medications: Beta blocker: carvedilol 3.125 mg BID ACE/ARB/ARNI: losartan 25 mg BID SGLT2i: Farxiga 5 mg daily  Pertinent Lab Values: Serum creatinine 1.08, BUN 23, Potassium 3.8, Sodium 137, BNP 653.4, Magnesium 2.0, A1c 8.1 (07/2019)   Vital Signs: Weight: 194 lbs (admission weight: 198 lbs) Blood pressure: 110/60s  Heart rate: 80s  I/O: -2.5L yesterday; net -4.6L  Medication Assistance / Insurance Benefits Check: Does the patient have prescription insurance?  Yes Type of insurance plan: UHC Medicare  Does the patient qualify for medication assistance through manufacturers or grants?   Pending household income info  Outpatient Pharmacy:  Prior to admission outpatient [facility]: [facility] Is the patient willing to use [facility] TOC pharmacy at discharge? Yes Is the patient willing to transition their outpatient pharmacy to utilize a [facility] outpatient pharmacy?   Pending    Assessment: 1. Acute on chronic systolic CHF (EF 58-85%), due to ICM. NYHA  class II symptoms. - Continue furosemide 80 mg IV daily - Continue carvedilol 6.25 mg BID - Consider transitioning to Entresto 24/26 mg BID  - Continue spironolactone 12.5 mg daily - Consider increasing Farxiga to 10 mg daily for HF (5 mg dose for diabetes only)   Plan: 1) Medication changes recommended at this time: - Stop losartan; start Entresto 24/26 mg BID; increase Farxiga to 10 mg daily   2) Patient assistance: - Patient is in the donut hole with insurance - Entresto copay $170; Marcelline Deist copay $146 - Can use Entresto 30-day free copay card to bridge him to the new year  3)  Education  - Patient has been educated on current HF medications and potential additions to HF medication regimen - Patient verbalizes understanding that over the next few months, these medication doses may change and more medications may be added to optimize HF regimen - Patient has been educated on basic disease state pathophysiology and goals of therapy - Reviewed Living with HF booklet   Sharen Hones, PharmD, BCPS Heart Failure Stewardship Pharmacist Phone [phone]

## 2021-11-21 NOTE — Discharge Summary (Signed)
Physician Discharge Summary  Anthony Alvarez IHK:[id number] DOB: January 28, 1956 DOA: 11/17/2021  PCP: Lawerance Cruel, MD  Admit date: 11/17/2021 Discharge date: 11/21/2021  Admitted From: home Discharge disposition: home   Recommendations for Outpatient Follow-Up:   Close cards follow up- BMP 1 week If no etiology of EF found, ? Holter monitor for tachycardia- on adderall  Consider sleep study-- RN reports O2 sat decreased while sleeping Follow up x ray: 6-8 weeks to assess for resolution of fluid   Discharge Diagnosis:   Principal Problem:   Dyspnea Active Problems:   Coronary artery disease   Ischemic cardiomyopathy   Hyperlipidemia associated with type 2 diabetes mellitus ([place])   Essential hypertension   Onychomycosis   Acute exacerbation of CHF (congestive heart failure) (HCC)   Acute on chronic HFrEF (heart failure with reduced ejection fraction) ([place])    Discharge Condition: Improved.  Diet recommendation: Low sodium, heart healthy.   Wound care: None.  Code status: Full.   History of Present Illness:   Anthony Alvarez  is a 65 y.o. male, with history of hyperlipidemia, ischemic cardiomyopathy, coronary artery disease, and more presents the ED with a chief complaint of dyspnea.  Patient reports that the dyspnea started night before last.  The first night it was a 4-5 out of 10 in severity.  He had mild dyspnea during the day.  He noticed it mostly on exertion, like when he was carrying his dog up a flight of stairs.  This is an activity he is normally able to do.  Last night dyspnea occurred much more severely at an 8-9 out of 10.  Sitting up he did make the dyspnea better.  He went to the urgent care he had a chest x-ray notes that he had worsening opacities need to come into the ED.  Patient reports when the dyspnea happens it wakes him up out of sleep.  He reports dyspnea during the day as almost not noticeable.  It is worth noting the patient had flu on  November 22.  He finished a 5-day course of Tamiflu.  Patient still has an associated dry cough.  He also reports sometimes he has URI symptoms with his Repatha injection.  Patient denies chest pain, pitting edema in his legs.  He reports he has been on Repatha about 6 weeks.  He has not been staying hydrated enough at home per his report all.  On further questioning it seems like over the last 3 weeks patient has had intermittent flu-like symptoms.  Patient has no other complaints at this time.   Patient does not smoke, does not drink alcohol, does not use illicit drugs, is not vaccinated for COVID.  Patient is full code.   Hospital Course by Problem:   Dyspnea due to acute systolic CHF -echo show decreased EF- new from 2021 -cards consult appreciated -s/p LHC -per cards:  -- Continue Coreg 6.2m BID - Continue Spiro 12.520mqd - start Entresto tomorrow at 24/2628mID - Increase  Farxiga to 48m67m -  start Lasix 40mg76mtomorrow     Hyperlipidemia -Patient has statin myopathy, resume Repatha outpatient   Diabetes mellitus type 2 -resume [facility]:   cards   Discharge Exam:   Vitals:   11/21/21 0344 11/21/21 0800  BP: (!) 102/57 110/64  Pulse: 79 77  Resp: 18   Temp: 97.8 F (36.6 C)   SpO2: 97%    Vitals:  11/20/21 1502 11/20/21 1936 11/21/21 0344 11/21/21 0800  BP: (!) 144/76 112/65 (!) 102/57 110/64  Pulse: 88 79 79 77  Resp:  (!) 22 18   Temp:  97.7 F (36.5 C) 97.8 F (36.6 C)   TempSrc:  Oral Oral   SpO2: 97% 96% 97%   Weight:   88 kg   Height:         The results of significant diagnostics from this hospitalization (including imaging, microbiology, ancillary and laboratory) are listed below for reference.     Procedures and Diagnostic Studies:   DG Chest 2 View  Result Date: 11/17/2021 CLINICAL DATA:  Pneumonia shortness of breath and wheezing EXAM: CHEST - 2 VIEW COMPARISON:  Chest radiograph 11/07/2021 FINDINGS:  Median sternotomy wires and mediastinal surgical clips are stable. The heart mildly enlarged, unchanged. Small bilateral pleural effusions with patchy opacities in the lung bases, right worse than left, have worsened since 11/07/2021. The upper lungs are well aerated. There is no pneumothorax A left clavicular fracture is again seen. There is no new acute osseous abnormality. IMPRESSION: Small bilateral pleural effusions with patchy opacities in the lung bases, right more than left, have worsened since 11/07/2021. Findings could reflect infection and parapneumonic effusions in the correct clinical setting. Recommend follow-up radiographs in 6-8 weeks to assess for resolution. Electronically Signed   By: Valetta Mole M.D.   On: 11/17/2021 12:43   ECHOCARDIOGRAM COMPLETE  Result Date: 11/18/2021    ECHOCARDIOGRAM REPORT   Patient Name:   Anthony Alvarez Date of Exam: 11/18/2021 Medical Rec #:  [id number]      Height:       70.0 in Accession #:    [id number]     Weight:       198.0 lb Date of Birth:  [birth year]      BSA:          2.078 m Patient Age:    65 years       BP:           122/71 mmHg Patient Gender: M              HR:           103 bpm. Exam Location:  Inpatient Procedure: 2D Echo, 3D Echo, Color Doppler and Cardiac Doppler Indications:    R06.9 DOE  History:        Patient has prior history of Echocardiogram examinations, most                 recent 12/21/2019. Prior CABG; Risk Factors:Hypertension, Diabetes                 and Dyslipidemia.  Sonographer:    Raquel Sarna Senior RDCS Referring Phys: [id number] ASIA B [place]  1. Severe global hypokinesis. LAD territory is akinetic. Left ventricular ejection fraction, by estimation, is 20 to 25%. The left ventricle has severely decreased function. The left ventricle demonstrates global hypokinesis. The left ventricular internal cavity size was moderately dilated. Left ventricular diastolic parameters are consistent with Grade II diastolic dysfunction  (pseudonormalization).  2. Right ventricular systolic function is moderately reduced. The right ventricular size is normal.  3. The mitral valve is normal in structure. Mild mitral valve regurgitation.  4. The aortic valve is grossly normal. Aortic valve regurgitation is trivial. Aortic valve sclerosis/calcification is present, without any evidence of aortic stenosis.  5. 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 Aortic dilatation noted.  6. The inferior vena cava  is normal in size with greater than 50% respiratory variability, suggesting right atrial pressure of 3 mmHg. Conclusion(s)/Recommendation(s): LV function severely globally hypokinetic with LAD terriory akinetic worsened compared to prior echo 12/21/2019 . No sigificant valvular disease. FINDINGS  Left Ventricle: Severe global hypokinesis. LAD territory is akinetic. Left ventricular ejection fraction, by estimation, is 20 to 25%. The left ventricle has severely decreased function. The left ventricle demonstrates global hypokinesis. The left ventricular internal cavity size was moderately dilated. There is no left ventricular hypertrophy. Abnormal (paradoxical) septal motion consistent with post-operative status. Left ventricular diastolic parameters are consistent with Grade II diastolic dysfunction (pseudonormalization). Right Ventricle: The right ventricular size is normal. No increase in right ventricular wall thickness. Right ventricular systolic function is moderately reduced. Left Atrium: Left atrial size was normal in size. Right Atrium: Right atrial size was normal in size. Pericardium: There is no evidence of pericardial effusion. Mitral Valve: The mitral valve is normal in structure. Mild mitral valve regurgitation. Tricuspid Valve: The tricuspid valve is normal in structure. Tricuspid valve regurgitation is not demonstrated. Aortic Valve: The aortic valve is grossly normal. Aortic valve regurgitation is trivial. Aortic valve  sclerosis/calcification is present, without any evidence of aortic stenosis. Pulmonic Valve: The pulmonic valve was not well visualized. Pulmonic valve regurgitation is not visualized. 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 Aortic dilatation noted. Venous: The inferior vena cava is normal in size with greater than 50% respiratory variability, suggesting right atrial pressure of 3 mmHg. IAS/Shunts: No atrial level shunt detected by color flow Doppler.  LEFT VENTRICLE PLAX 2D LVIDd:         5.90 cm   Diastology LVIDs:         5.30 cm   LV e' medial:    5.44 cm/s LV PW:         0.80 cm   LV E/e' medial:  6.5 LV IVS:        0.90 cm   LV e' lateral:   3.70 cm/s LVOT diam:     2.00 cm   LV E/e' lateral: 9.6 LV SV:         49 LV SV Index:   24 LVOT Area:     3.14 cm                           3D Volume EF:                          3D EF:        20 %                          LV EDV:       254 ml                          LV ESV:       204 ml                          LV SV:        51 ml RIGHT VENTRICLE RV S prime:     9.14 cm/s TAPSE (M-mode): 1.3 cm LEFT ATRIUM           Index        RIGHT ATRIUM  Index LA diam:      3.00 cm 1.44 cm/m   RA Area:     15.70 cm LA Vol (A2C): 69.8 ml 33.58 ml/m  RA Volume:   35.90 ml  17.27 ml/m LA Vol (A4C): 43.5 ml 20.93 ml/m  AORTIC VALVE LVOT Vmax:   104.00 cm/s LVOT Vmean:  70.100 cm/s LVOT VTI:    0.156 m  AORTA Ao Root diam: 4.50 cm Ao Asc diam:  3.30 cm MITRAL VALVE MV Area (PHT): 3.88 cm     SHUNTS MV Decel Time: 196 msec     Systemic VTI:  0.16 m MV E velocity: 35.35 cm/s   Systemic Diam: 2.00 cm MV A velocity: 162.00 cm/s MV E/A ratio:  0.22 Landscape architect signed by Phineas Inches Signature Date/Time: 11/18/2021/4:28:47 PM    Final      Labs:   Basic Metabolic Panel: Recent Labs  Lab 11/17/21 1207 11/18/21 0813 11/19/21 0555 11/20/21 0428 11/20/21 0847 11/20/21 0850 11/21/21 0233  NA 138 138 138 136 141 140 137  K 4.0 4.2 4.0  3.9 3.8 3.9 3.8  CL 108 108 106 105  --   --  102  CO2 18* 18* 20* 24  --   --  25  GLUCOSE 108* 107* 115* 128*  --   --  106*  BUN _0 --   --  23  CREATININE 0.87 0.98 0.97 1.11  --   --  1.08  CALCIUM 9.3 9.0 9.3 9.2  --   --  9.6  MG  --  2.0  --   --   --   --   --    GFR Estimated Creatinine Clearance: 74.2 mL/min (by C-G formula based on SCr of 1.08 mg/dL). Liver Function Tests: Recent Labs  Lab 11/17/21 1207 11/18/21 0813  AST 21 17  ALT 14 9  ALKPHOS 35* 33*  BILITOT 2.2* 2.5*  PROT 6.6 5.7*  ALBUMIN 4.1 3.5   No results for input(s): LIPASE, AMYLASE in the last 168 hours. No results for input(s): AMMONIA in the last 168 hours. Coagulation profile No results for input(s): INR, PROTIME in the last 168 hours.  CBC: Recent Labs  Lab 11/17/21 1207 11/18/21 0813 11/20/21 0847 11/20/21 0850  WBC 10.9* 10.6*  --   --   NEUTROABS 6.9 7.5  --   --   HGB 18.4* 16.6 14.6 15.0  HCT 53.2* 48.1 43.0 44.0  MCV 89.9 89.6  --   --   PLT 305 280  --   --    Cardiac Enzymes: No results for input(s): CKTOTAL, CKMB, CKMBINDEX, TROPONINI in the last 168 hours. BNP: Invalid input(s): POCBNP CBG: No results for input(s): GLUCAP in the last 168 hours. D-Dimer No results for input(s): DDIMER in the last 72 hours. Hgb A1c No results for input(s): HGBA1C in the last 72 hours. Lipid Profile Recent Labs    11/19/21 0555  CHOL 75  HDL 30*  LDLCALC 18  TRIG 134  CHOLHDL 2.5   Thyroid function studies No results for input(s): TSH, T4TOTAL, T3FREE, THYROIDAB in the last 72 hours.  Invalid input(s): FREET3 Anemia work up No results for input(s): VITAMINB12, FOLATE, FERRITIN, TIBC, IRON, RETICCTPCT in the last 72 hours. Microbiology Recent Results (from the past 240 hour(s))  Resp Panel by RT-PCR (Flu A&B, Covid) Nasopharyngeal Swab     Status: None   Collection Time: 11/17/21 10:58 PM   Specimen: Nasopharyngeal Swab; Nasopharyngeal(NP) swabs  in vial transport  medium  Result Value Ref Range Status   SARS Coronavirus 2 by RT PCR NEGATIVE NEGATIVE Final    Comment: (NOTE) SARS-CoV-2 target nucleic acids are NOT DETECTED.  The SARS-CoV-2 RNA is generally detectable in upper respiratory specimens during the acute phase of infection. The lowest concentration of SARS-CoV-2 viral copies this assay can detect is 138 copies/mL. A negative result does not preclude SARS-Cov-2 infection and should not be used as the sole basis for treatment or other patient management decisions. A negative result may occur with  improper specimen collection/handling, submission of specimen other than nasopharyngeal swab, presence of viral mutation(s) within the areas targeted by this assay, and inadequate number of viral copies(<138 copies/mL). A negative result must be combined with clinical observations, patient history, and epidemiological information. The expected result is Negative.  Fact Sheet for Patients:  EntrepreneurPulse.com.au  Fact Sheet for Healthcare Providers:  IncredibleEmployment.be  This test is no t yet approved or cleared by the [place] FDA and  has been authorized for detection and/or diagnosis of SARS-CoV-2 by FDA under an Emergency Use Authorization (EUA). This EUA will remain  in effect (meaning this test can be used) for the duration of the COVID-19 declaration under Section 564(b)(1) of the Act, 21 U.S.C.section 360bbb-3(b)(1), unless the authorization is terminated  or revoked sooner.       Influenza A by PCR NEGATIVE NEGATIVE Final   Influenza B by PCR NEGATIVE NEGATIVE Final    Comment: (NOTE) The Xpert Xpress SARS-CoV-2/FLU/RSV plus assay is intended as an aid in the diagnosis of influenza from Nasopharyngeal swab specimens and should not be used as a sole basis for treatment. Nasal washings and aspirates are unacceptable for Xpert Xpress SARS-CoV-2/FLU/RSV testing.  Fact Sheet for  Patients: EntrepreneurPulse.com.au  Fact Sheet for Healthcare Providers: IncredibleEmployment.be  This test is not yet approved or cleared by the [place] FDA and has been authorized for detection and/or diagnosis of SARS-CoV-2 by FDA under an Emergency Use Authorization (EUA). This EUA will remain in effect (meaning this test can be used) for the duration of the COVID-19 declaration under Section 564(b)(1) of the Act, 21 U.S.C. section 360bbb-3(b)(1), unless the authorization is terminated or revoked.  Performed at [facility], [place] [address], [place], [place] [address]      Discharge Instructions:   Discharge Instructions     (HEART FAILURE PATIENTS) Call MD:  Anytime you have any of the following symptoms: 1) 3 pound weight gain in 24 hours or 5 pounds in 1 week 2) shortness of breath, with or without a dry hacking cough 3) swelling in the hands, feet or stomach 4) if you have to sleep on extra pillows at night in order to breathe.   Complete by: As directed    Amb Referral to Cardiac Rehabilitation   Complete by: As directed    Diagnosis: Heart Failure (see criteria below if ordering Phase II)   Heart Failure Type: Chronic Systolic & Diastolic   After initial evaluation and assessments completed: Virtual Based Care may be provided alone or in conjunction with Phase 2 Cardiac Rehab based on patient barriers.: Yes   Diet - low sodium heart healthy   Complete by: As directed    Heart Failure patients record your daily weight using the same scale at the same time of day   Complete by: As directed    Increase activity slowly   Complete by: As directed  Allergies as of 11/21/2021       Reactions   Atorvastatin Other (See Comments)   Memory deficit, fatigue, myalgias, insomnia; similar symptoms with rosuvastatin.   Icosapent Ethyl    lethargy   Rosuvastatin    Other reaction(s): Other (See Comments)        Medication  List     STOP taking these medications    amphetamine-dextroamphetamine 20 MG tablet Commonly known as: ADDERALL   blood glucose meter kit and supplies Kit   losartan 50 MG tablet Commonly known as: COZAAR   oseltamivir 75 MG capsule Commonly known as: TAMIFLU   sildenafil 50 MG tablet Commonly known as: VIAGRA       TAKE these medications    acetaminophen 500 MG tablet Commonly known as: TYLENOL Take 500-1,000 mg by mouth every 8 (eight) hours as needed for mild pain.   aspirin EC 81 MG tablet Take 81 mg by mouth daily.   CALCIUM-MAGNESIUM-ZINC PO Take 1 tablet by mouth daily.   carvedilol 6.25 MG tablet Commonly known as: COREG Take 1 tablet (6.25 mg total) by mouth 2 (two) times daily with a meal. What changed:  medication strength how much to take when to take this   dapagliflozin propanediol 10 MG Tabs tablet Commonly known as: FARXIGA Take 1 tablet (10 mg total) by mouth daily. Start taking on: November 22, 2021 What changed:  medication strength how much to take how to take this when to take this additional instructions   furosemide 40 MG tablet Commonly known as: LASIX Take 1 tablet (40 mg total) by mouth daily. Start taking on: November 22, 2021   glucose blood test strip TEST FOUR TIMES DAILY OR AS DIRECTED   guaiFENesin 100 MG/5ML liquid Commonly known as: ROBITUSSIN Take 5 mLs by mouth every 4 (four) hours as needed for cough or to loosen phlegm.   MAGNESIUM PO Take 1 tablet by mouth daily.   Melatonin 10 MG Caps Take 10 mg by mouth at bedtime as needed (sleep).   Multi-Vitamin tablet Take 1 tablet by mouth daily.   onetouch ultrasoft lancets Use as instructed   Repatha SureClick 876 MG/ML [place] Generic drug: Evolocumab Inject 140 mg into the skin every 14 (fourteen) days.   sacubitril-valsartan 24-26 MG Commonly known as: ENTRESTO Take 1 tablet by mouth 2 (two) times daily. Start taking on: November 22, 2021    spironolactone 25 MG tablet Commonly known as: ALDACTONE Take 0.5 tablets (12.5 mg total) by mouth daily. Start taking on: November 22, 2021   terbinafine 250 MG tablet Commonly known as: LAMISIL Take 250 mg by mouth See admin instructions. 276m oral daily for 7 days every month, last week of the month   Vitamin D3 50 MCG (2000 UT) capsule Take 2,000 Units by mouth daily.   Zinc 50 MG Tabs Take 50 mg by mouth daily.        Follow-up Information     [facility] Follow up.   Specialty[facility]iology [facility] [id number] Located on the 1st floor of [address]. Contact information: [id number]       RLawerance Cruel MD Follow up in 1 week(s).   Specialty: Family Medicine Contact information: [place] [phone]                 Time coordinating discharge: 344  min  Signed:  Geradine Girt DO  [facility] 11/21/2021, 11:22 AM

## 2021-11-27 ENCOUNTER — Ambulatory Visit (HOSPITAL_COMMUNITY)
Admit: 2021-11-27 | Discharge: 2021-11-27 | Disposition: A | Discharge status: Home / Self Care | Payer: Medicare Other | Attending: Family Medicine | Admitting: Family Medicine

## 2021-11-27 ENCOUNTER — Telehealth (HOSPITAL_COMMUNITY): Payer: Self-pay

## 2021-11-27 ENCOUNTER — Encounter (HOSPITAL_COMMUNITY): Payer: Self-pay

## 2021-11-27 ENCOUNTER — Other Ambulatory Visit: Payer: Self-pay

## 2021-11-27 VITALS — BP 98/70 | HR 85 | Ht 70.0 in | Wt 195.8 lb

## 2021-11-27 DIAGNOSIS — E785 Hyperlipidemia, unspecified: Secondary | ICD-10-CM | POA: Insufficient documentation

## 2021-11-27 DIAGNOSIS — I252 Old myocardial infarction: Secondary | ICD-10-CM | POA: Insufficient documentation

## 2021-11-27 DIAGNOSIS — Z951 Presence of aortocoronary bypass graft: Secondary | ICD-10-CM | POA: Diagnosis not present

## 2021-11-27 DIAGNOSIS — F909 Attention-deficit hyperactivity disorder, unspecified type: Secondary | ICD-10-CM | POA: Insufficient documentation

## 2021-11-27 DIAGNOSIS — I2510 Atherosclerotic heart disease of native coronary artery without angina pectoris: Secondary | ICD-10-CM | POA: Insufficient documentation

## 2021-11-27 DIAGNOSIS — E119 Type 2 diabetes mellitus without complications: Secondary | ICD-10-CM | POA: Diagnosis not present

## 2021-11-27 DIAGNOSIS — I5022 Chronic systolic (congestive) heart failure: Secondary | ICD-10-CM | POA: Insufficient documentation

## 2021-11-27 DIAGNOSIS — Z87891 Personal history of nicotine dependence: Secondary | ICD-10-CM | POA: Insufficient documentation

## 2021-11-27 DIAGNOSIS — Z79899 Other long term (current) drug therapy: Secondary | ICD-10-CM | POA: Diagnosis not present

## 2021-11-27 DIAGNOSIS — I255 Ischemic cardiomyopathy: Secondary | ICD-10-CM | POA: Diagnosis not present

## 2021-11-27 DIAGNOSIS — I447 Left bundle-branch block, unspecified: Secondary | ICD-10-CM | POA: Insufficient documentation

## 2021-11-27 DIAGNOSIS — Z7982 Long term (current) use of aspirin: Secondary | ICD-10-CM | POA: Diagnosis not present

## 2021-11-27 LAB — BASIC METABOLIC PANEL
Anion gap: 10 (ref 5–15)
BUN: 24 mg/dL — ABNORMAL HIGH (ref 8–23)
CO2: 23 mmol/L (ref 22–32)
Calcium: 9.5 mg/dL (ref 8.9–10.3)
Chloride: 103 mmol/L (ref 98–111)
Creatinine, Ser: 1.03 mg/dL (ref 0.61–1.24)
GFR, Estimated: >60 mL/min (ref >60)
Glucose, Bld: 130 mg/dL — ABNORMAL HIGH (ref 70–99)
Potassium: 4.5 mmol/L (ref 3.5–5.1)
Sodium: 136 mmol/L (ref 135–145)

## 2021-11-27 MED ORDER — FUROSEMIDE 40 MG PO TABS: 40.0000 mg | ORAL_TABLET | ORAL | 0 refills | Status: DC | PRN

## 2021-11-27 NOTE — Patient Instructions (Signed)
CHANGE Lasix 40  mg to as needed for swelling or weight gain ( 3lbs overnight or 5 lbs in a week)   Labs today We will only contact you if something comes back abnormal or we need to make some changes. Otherwise no news is good news!  Your physician recommends that you schedule a follow-up appointment in: 3-4 weeks with the pharmacy team and in 8 weeks with Dr Gala Romney  Do the following things EVERYDAY: Weigh yourself in the morning before breakfast. Write it down and keep it in a log. Take your medicines as prescribed Eat low salt foods--Limit salt (sodium) to 2000 mg per day.  Stay as active as you can everyday Limit all fluids for the day to less than 2 liters

## 2021-11-27 NOTE — Progress Notes (Signed)
HEART & VASCULAR TRANSITION OF CARE CONSULT NOTE     Referring Physician: Dr End  Primary Care: Dr Tenny Craw Primary Cardiologist: Dr Herbie Baltimore  CT Surgery: Dr Cliffton Asters  HPI: Referred to clinic by Dr End for heart failure consultation.   [name] is a 65 year old with a history of CAD, S/P CABG 2020, DM, hyperlipidemia, ADHD previously on adderall at leat 13 years, and LBBB  .   Admitted 07/2019 with NSTEMI. LHC critical lesions of the RV branch 95%, RPA V lesion 95%, mid RCA 95% and proximal LAD 100%.  LVEF at that time was 35 to 45%.  He underwent CABG with LIMA to LAD, SVG to PDA, SVG to PL 08/13/2019 with Dr. Cliffton Asters.   In 2021 EF was up to 45-50%.   Of note he had the flu the week before admit. Admitted with 11/17/21 with A/C HFrEF. Diuresed with IV lasix. Had cath with severe 2V CAD with CTO of proximal LAD and mild RCA + chronically occluded SVG-rPDA and SVG-rPL. ECHO EF 20-25% with moderately reduced EF. Recommendations for aggressive GDMT. Started entresto and farxiga was increased to 10 mg daily. Of note adderall was stopped.   Today he presents with his wife. Overall feeling fine. Mild SOB with exertion. Denies PND/Orthopnea. No chest pain. Appetite ok. No fever or chills. Weight at home has gone down to 190 pounds. Taking all medications.  Cardiac Testing  Echo 11/2021 EF 20-25% RV moderately reduced  Echo 1/20221 EF 45-50%   RHC/LHC 11/20/21 Severe two-vessel coronary artery disease with chronic total occlusions of proximal LAD and mid RCA.  There is also moderate diffuse disease of the distal LCx. Widely patent LIMA-D3, which backfills the mid and distal LAD. Chronically occluded SVG-rPDA and SVG-rPL. Mildly-moderately elevated left heart filling pressure. Normal right heart filling pressure. Mildly reduced Fick cardiac output.  Recommendations: Continue aggressive secondary prevention of coronary artery disease. Gentle diuresis. Escalate goal-directed medical therapy  of HFrEF.  Review of Systems: [y] = yes, [ ]  = no   General: Weight gain [ ] ; Weight loss [ ] ; Anorexia [ ] ; Fatigue [ ] ; Fever [ ] ; Chills [ ] ; Weakness [ ]   Cardiac: Chest pain/pressure [ ] ; Resting SOB [ ] ; Exertional SOB [ Y]; Orthopnea [ ] ; Pedal Edema [ ] ; Palpitations [ ] ; Syncope [ ] ; Presyncope [ ] ; Paroxysmal nocturnal dyspnea[ ]   Pulmonary: Cough [ ] ; Wheezing[ ] ; Hemoptysis[ ] ; Sputum [ ] ; Snoring [ ]   GI: Vomiting[ ] ; Dysphagia[ ] ; Melena[ ] ; Hematochezia [ ] ; Heartburn[ ] ; Abdominal pain [ ] ; Constipation [ ] ; Diarrhea [ ] ; BRBPR [ ]   GU: Hematuria[ ] ; Dysuria [ ] ; Nocturia[ ]   Vascular: Pain in legs with walking [ ] ; Pain in feet with lying flat [ ] ; Non-healing sores [ ] ; Stroke [ ] ; TIA [ ] ; Slurred speech [ ] ;  Neuro: Headaches[ ] ; Vertigo[ ] ; Seizures[ ] ; Paresthesias[ ] ;Blurred vision [ ] ; Diplopia [ ] ; Vision changes [ ]   Ortho/Skin: Arthritis [ ] ; Joint pain [ ] ; Muscle pain [ ] ; Joint swelling [ ] ; Back Pain [ ] ; Rash [ ]   Psych: Depression[ ] ; Anxiety[ ]   Heme: Bleeding problems [ ] ; Clotting disorders [ ] ; Anemia [ ]   Endocrine: Diabetes [ Y]; Thyroid dysfunction[ ]    Past Medical History:  Diagnosis Date   HLD (hyperlipidemia)    Hyperlipidemia associated with type 2 diabetes mellitus (HCC) 11/30/2019   Ischemic cardiomyopathy 11/30/2019   Multivessel CAD - 100% LAD (with Diag), 99%  mRCA (subtotal occlusio) 08/12/2019   NSTEMI (non-ST elevated myocardial infarction) (HCC) 08/10/2019   S/P Off Pump CABG x 3 08/13/2019   LIMA to LAD RSVG to PDA RSVG to PLVB    Current Outpatient Medications  Medication Sig Dispense Refill   acetaminophen (TYLENOL) 500 MG tablet Take 500-1,000 mg by mouth every 8 (eight) hours as needed for mild pain.     amphetamine-dextroamphetamine (ADDERALL) 20 MG tablet Take 0.5 mg by mouth 4 (four) times daily as needed. Focus     aspirin EC 81 MG tablet Take 81 mg by mouth daily.     CALCIUM-MAGNESIUM-ZINC PO Take 1 tablet by mouth daily.      carvedilol (COREG) 6.25 MG tablet Take 1 tablet (6.25 mg total) by mouth 2 (two) times daily with a meal. 60 tablet 0   Cholecalciferol (VITAMIN D3) 50 MCG (2000 UT) capsule Take 2,000 Units by mouth daily.     dapagliflozin propanediol (FARXIGA) 10 MG TABS tablet Take 1 tablet (10 mg total) by mouth daily. 30 tablet 0   Evolocumab (REPATHA SURECLICK) 140 MG/ML SOAJ Inject 140 mg into the skin every 14 (fourteen) days. 2 mL 11   furosemide (LASIX) 40 MG tablet Take 1 tablet (40 mg total) by mouth daily. 30 tablet 0   glucose blood test strip TEST FOUR TIMES DAILY OR AS DIRECTED 100 each 1   guaiFENesin (ROBITUSSIN) 100 MG/5ML liquid Take 5 mLs by mouth every 4 (four) hours as needed for cough or to loosen phlegm.     Lancets (ONETOUCH ULTRASOFT) lancets Use as instructed 100 each 1   MAGNESIUM PO Take 1 tablet by mouth daily.     Melatonin 10 MG CAPS Take 10 mg by mouth at bedtime as needed (sleep).     Multiple Vitamin (MULTI-VITAMIN) tablet Take 1 tablet by mouth daily.     sacubitril-valsartan (ENTRESTO) 24-26 MG Take 1 tablet by mouth 2 (two) times daily. 60 tablet 1   spironolactone (ALDACTONE) 25 MG tablet Take 1/2 tablet (12.5 mg total) by mouth daily. 30 tablet 0   terbinafine (LAMISIL) 250 MG tablet Take 250 mg by mouth See admin instructions. 250mg  oral daily for 7 days every month, last week of the month     Zinc 50 MG TABS Take 50 mg by mouth daily.     No current facility-administered medications for this encounter.    Allergies  Allergen Reactions   Atorvastatin Other (See Comments)    Memory deficit, fatigue, myalgias, insomnia; similar symptoms with rosuvastatin.   Icosapent Ethyl     lethargy   Rosuvastatin     Other reaction(s): Other (See Comments)      Social History   Socioeconomic History   Marital status: Married    Spouse name: Not on file   Number of children: Not on file   Years of education: Not on file   Highest education level: Not on file   Occupational History   Not on file  Tobacco Use   Smoking status: Former    Types: Cigarettes   Smokeless tobacco: Never  Substance and Sexual Activity   Alcohol use: Not on file   Drug use: Not on file   Sexual activity: Not on file  Other Topics Concern   Not on file  Social History Narrative   Not on file   Social Determinants of Health   Financial Resource Strain: Not on file  Food Insecurity: Not on file  Transportation Needs: Not on file  Physical Activity: Not on file  Stress: Not on file  Social Connections: Not on file  Intimate Partner Violence: Not on file     No family history on file.  Vitals:   11/27/21 1015  BP: 98/70  Pulse: 85  SpO2: 100%  Weight: 88.8 kg  Height: 5\' 10"  (1.778 m)   Wt Readings from Last 3 Encounters:  11/27/21 88.8 kg  11/21/21 88 kg  11/07/21 90.7 kg   REDS CLIP 30%.   PHYSICAL EXAM: General:  Well appearing. No respiratory difficulty HEENT: normal Neck: supple. no JVD. Carotids 2+ bilat; no bruits. No lymphadenopathy or thryomegaly appreciated. Cor: PMI nondisplaced. Regular rate & rhythm. No rubs, gallops or murmurs. Lungs: clear Abdomen: soft, nontender, nondistended. No hepatosplenomegaly. No bruits or masses. Good bowel sounds. Extremities: no cyanosis, clubbing, rash, edema Neuro: alert & oriented x 3, cranial nerves grossly intact. moves all 4 extremities w/o difficulty. Affect pleasant.  ECG: SR with occasional PVCs LBBB QRS 170 ms    ASSESSMENT & PLAN: Chronic HFrEF, ICM + LBBB. Had the flu in November which could also contribute to cardiomyopathy. He is not longer taking adderall but would like restart. I have asked him to hold off due to reduced EF.  -Echo in 2021 EF was higher at 45-50%. Earlier this month EF was down to 20-25%.  -NYHA II-III. Mild SOB with exertion.  - Reds Clip 30%. Will change lasix to 40 mg as needed for 3-4 pound weight gain in 24 hous.  - No room to titrate medications with SBP in  90s.  -Continue carvedilol 6.25 mg twice a day --> He prefers to not increase due to ED.  -Continue entresto 24-26 mg twice a day  -Continue 12.5 mg spiro daily  Continue farxiga 10 mg daily - Optimize GDMT then plan to repeat ECHO in 3 months. May need CMRI as well if EF has not improved. - If EF remains < 35% could consider CRT-D. We discussed today.  - Check BMET   2. LBBB -EKG today . QRS 170 ms.  -If EF does not improve after 3 months could consider CRT-D  3.CAD -S/P CABG 2021 -LHC 11/2021 severe 2V CAD with CTO of proximal LAD and mild RCA + chronically occluded SVG-rPDA and SVG-rPL. Intolerant statin. On Repatha.  - on aspirin and carvedilol.  - No chest pain  4. DMII Followed by [facility] PCP.   5. ADHD For now would keep off adderall until we see what his EF is after HF medication optimization.   Referred to HFSW ( Medications ): Yes for SDOH screen. SW helping with medication assistance.  Refer to Pharmacy: Yes  Refer to Home Health: No  Refer to Advanced Heart Failure Clinic: Yes--> Dr Haroldine Laws  Refer to General Cardiology: Shared with Dr Ellyn Hack.   Follow up 3-4 weeks with pharmacy.   Britney Captain NP-C  10:58 AM

## 2021-11-27 NOTE — Progress Notes (Signed)
CSW met with pt and completed SDOH screening- no immediate concerns identified.  Pt did report some medications he is on being expensive (farxiga and entresto)  and being worried about insurance starting over in January and what that will mean for cost.  CSW informed him of patient assistance programs that might help and provided with applications- they will complete and turn back in to clinic for submission.  CSW Production assistant, radio at [employer] office about [place] assistance as looked like he had been involved with pt regarding this medication in the past.  Will continue to follow and assist as needed   Jorge Ny, [facility] Desk#: [phone] Cell#: [phone]

## 2021-11-27 NOTE — Addendum Note (Signed)
Encounter addended by: Burna Sis, LCSW on: 11/27/2021 12:25 PM  Actions taken: Flowsheet accepted, Clinical Note Signed

## 2021-11-27 NOTE — Telephone Encounter (Signed)
Called patient to see if she is interested in the Cardiac Rehab Program. Patient expressed interest. Explained scheduling process and went over insurance process, patient verbalized understanding. Will contact patient for scheduling at a later date.. 

## 2021-11-27 NOTE — Progress Notes (Signed)
ReDS Vest / Clip - 11/27/21 1015       ReDS Vest / Clip   Station Marker D    Ruler Value 33    ReDS Value Range Low volume    ReDS Actual Value 30

## 2021-11-28 ENCOUNTER — Telehealth: Payer: Self-pay

## 2021-11-28 MED ORDER — REPATHA SURECLICK 140 MG/ML ~~LOC~~ SOAJ
140.0000 mg | SUBCUTANEOUS | 3 refills | Status: DC
Start: 2021-11-28 — End: 2022-11-19

## 2021-11-28 NOTE — Telephone Encounter (Signed)
-----  Message from Rollen Sox, Leighann Amadon [facility] sent at 11/27/2021  1:11 PM EST ----- Absolutely.  We can take care of it for you   ----- Message ----- From: Rudean Curt Sent: 11/27/2021  12:23 PM EST To: Rollen Sox, [place]  My name is [place] i'm a Education officer, museum with the [employer].  I met with this pt today and he had several medications he was concerned about paying for come January so I provided him applications for his heart failure meds (farixga and entresto) but saw you had worked with pt on his [place] so didn't know if someone in [place] office could reach out to him about [place] assistance as well?  Thank you,  Eliezer Lofts

## 2021-11-28 NOTE — Telephone Encounter (Signed)
Called andspoke to pt and was able to email him confirmation that he was approved for the [employer] grant. Pt voiced understanding

## 2021-12-04 ENCOUNTER — Ambulatory Visit: Payer: Medicare Other | Admitting: Cardiology

## 2021-12-04 ENCOUNTER — Encounter: Payer: Self-pay | Admitting: Cardiology

## 2021-12-04 ENCOUNTER — Other Ambulatory Visit: Payer: Self-pay

## 2021-12-04 VITALS — BP 94/60 | HR 73 | Ht 70.0 in | Wt 201.2 lb

## 2021-12-04 DIAGNOSIS — I255 Ischemic cardiomyopathy: Secondary | ICD-10-CM | POA: Diagnosis not present

## 2021-12-04 DIAGNOSIS — E781 Pure hyperglyceridemia: Secondary | ICD-10-CM

## 2021-12-04 DIAGNOSIS — E785 Hyperlipidemia, unspecified: Secondary | ICD-10-CM

## 2021-12-04 DIAGNOSIS — T466X5A Adverse effect of antihyperlipidemic and antiarteriosclerotic drugs, initial encounter: Secondary | ICD-10-CM

## 2021-12-04 DIAGNOSIS — I5022 Chronic systolic (congestive) heart failure: Secondary | ICD-10-CM

## 2021-12-04 DIAGNOSIS — I25708 Atherosclerosis of coronary artery bypass graft(s), unspecified, with other forms of angina pectoris: Secondary | ICD-10-CM

## 2021-12-04 DIAGNOSIS — I25118 Atherosclerotic heart disease of native coronary artery with other forms of angina pectoris: Secondary | ICD-10-CM | POA: Diagnosis not present

## 2021-12-04 DIAGNOSIS — T466X5D Adverse effect of antihyperlipidemic and antiarteriosclerotic drugs, subsequent encounter: Secondary | ICD-10-CM

## 2021-12-04 DIAGNOSIS — E1169 Type 2 diabetes mellitus with other specified complication: Secondary | ICD-10-CM

## 2021-12-04 DIAGNOSIS — G720 Drug-induced myopathy: Secondary | ICD-10-CM

## 2021-12-04 NOTE — Patient Instructions (Addendum)
Medication Instructions:    CHECK BLOOD PRESSURE  , IF SYSTOLIC BLOOD PRESSURE ( TOP NUMBER  IS 95 OR LESS) IN THE MORNING DO NOT TAKE SPIRONOLACTONE    IF BLOOD PRESSURE IS  95 OR LESS  THAN IN THE AFTERNOON OR  EVENING TAKE  1/2 TABLET OF ENTRESTO 24/26 MG    *If you need a refill on your cardiac medications before your next appointment, please call your pharmacy*   Lab Work:  NOT NEEDED   Testing/Procedures: NOT NEEDED   Follow-Up: At [facility], you and your health needs are our priority.  As part of our continuing mission to provide you with exceptional heart care, we have created designated Provider Care Teams.  These Care Teams include your primary Cardiologist (physician) and Advanced Practice Providers (APPs -  Physician Assistants and Nurse Practitioners) who all work together to provide you with the care you need, when you need it.     Your next appointment:   6 month(s)  The format for your next appointment:   In Person  Provider:   [name], MD

## 2021-12-04 NOTE — Progress Notes (Signed)
Primary Care Provider: Lawerance Cruel, MD Cardiologist: Glenetta Hew, MD Advanced Heart Failure Cardiologist: Dr. Linna Caprice, [place] Electrophysiologist: None  Clinic Note: Chief Complaint  Patient presents with   Hospitalization Follow-up    Admitted for CHF exacerbation early December, was seen by advanced CHF clinic, now here for primary cardiology follow-up.   Congestive Heart Failure    Newly reduced LVEF & progression of CAD.   ===================================  ASSESSMENT/PLAN   Problem List Items Addressed This Visit       Cardiology Problems   Coronary artery disease involving coronary bypass graft of native heart (Chronic)    He justifiably had questions about why these grafts closed so quickly.  Difficult to know since there is significant upstream RCA disease as well as in the RPA V.  Potentially mechanical issues with the grafts versus competitive flow collateralization.  Question benefit of CTO PCI of RCA.      Chronic HFrEF (heart failure with reduced ejection fraction) (HCC) - Primary (Chronic)    Recent exacerbation of CHF with now newly reduced EF back down to 20-25% after initially going to 45% post CABG.  Unfortunately, his entire RCA system is now filling via left-to-right collaterals with the RPL graft and RPDA graft closed as well as native RCA.  He has been started on Entresto along with carvedilol which we cannot titrate doses further because of hypotension. Is also on low-dose spironolactone plus Farxiga 10 mg.  This combination has kept EXTR only using Lasix as needed.  I reiterated the importance of sliding scale Lasix based on 3 pound weight gain.  He says he feels better with his weight normalizing at 195 as opposed to 1 90.  At that lower weight he was very dizzy and lightheaded and fatigued.  We sent his home dry weight at 195.  Unable to wear belts orthostatic hypotension and intermittent hypotension as it is today.  Indicated  that if his blood pressures are running low i.e. <95 mmHg, he should hold spironolactone and take one half dose of Entresto.      Coronary artery disease involving native coronary artery of native heart (Chronic)    Now severe two-vessel CAD with occluded LAD and RCA.  No active angina symptoms per se mostly CHF symptoms of exertional dyspnea.  He does not recall having chest pain at the time of his MI either.   Plan: Continue aspirin now Plavix. ->  For now okay to hold Plavix 7 days preop for surgery. Statin intolerant-on Repatha with excellent lipid. On max dose tolerated carvedilol and Entresto With the RCA/PDA being perfused via retrograde collaterals from the LIMA-LAD, question if reperfusion in the RCA territory with antegrade flow would be beneficial.   I will discuss with Dr. [name] and Irish Lack about potential of CTO intervention on the RCA which would allow for perfusion of the RCA/RPDA allowing the LAD to be perfused without having to collateralized the RCA.        Ischemic cardiomyopathy (Chronic)    Unfortunately, his EF is now notably worse.  Interestingly, the echo was read as having LAD wall motion abnormality despite the fact abilities revascularized.  The LAD had been totally closed at the time of his non-STEMI.  It is now the RCA distribution that is being filled via collaterals from the grafted vessel.  Cannot be sure if the total drop in EF was simply ischemic versus also some component of myocarditis from influenza.  Plan is to reassess EF  and 3 months which would be February in order to determine if EF has improved on optimize medical management. If his EF has not improved to greater than 35%, would likely refer for ICD for possible CRT-D given his LBBB.      Hyperlipidemia associated with type 2 diabetes mellitus (HCC) (Chronic)    Thankfully, he was started on Repatha with the first lipid panel after starting Repatha being outstanding.  Some of this may be due to  his acute illness, but LDL down to 18.  Continue Repatha.  Continue Farxiga for diabetes control-with last A1c of 5.2.  Being managed by PCP.      Hypertriglyceridemia (Chronic)    Intolerant of Vascepa up and fenofibrate.  Thankfully, on Repatha triglycerides are well controlled now 134.  Also benefiting from dietary modification, and glycemic control..        Other   Statin myopathy    Intolerant of multiple different statins as indicated previously.  Was converted to [place] with excellent lipid control.       ===================================  HPI:    Anthony Alvarez is a 65 y.o. male with a PMH for MV CAD (non-STEMI-CABG x3 07/2021), HTN, HLD and DM-2 with new diagnosis of severe ICM/chronic HFrEF who presents today for 6 month/Hospital F/U.  07/2019 - NSTEMI => Cardiac Cath => critical lesions of the RV branch 95%, RPA V lesion 95%, mid RCA 95% and proximal LAD 100%.  LVEF at that time was 35 to 45%.   CABG with LIMA to D3, SVG to PDA, SVG to PL 08/13/2019 -  Dr. Kipp Brood.  ISCHEMIC CARDIOMYOPATHY (ICM) 2021 EF was up to 45-50%.  However he did not really have any significant CHF symptoms.  NYHA Class I Known LBBB 11/17/2021 -> admitted with acute on chronic combined systolic diastolic heart berry with EF down to 20-25% in setting of recent influenza infection. Cath with RCA & SVG-RPDA, SVG-RPL CTO along with known LAD CTO. L-RPDA collaterals.   I last saw Anthony Alvarez on May 29, 2021 -> he noted feeling pretty poorly in the beginning of the year with poor memory sleep and muscle aches with exertional dyspnea and dizziness.  He felt much better after stopping statin.  Symptoms still recurred with rosuvastatin (referred to [facility]).  He still noted exertional dyspnea with overexertion, but not with activity.  Mild elevated but no PND orthopnea or edema.  Mild orthostatic dizziness.  Relatively normal blood pressures ranging in the 235T systolic. (110s-150s).  Was  feeling better after stopping statin get back into regular activity.  He had lost 16 pounds. => He was stable.  No changes made.  => He and his wife have made dramatic changes to lifestyle.  Adjusted his diet with significant provement in A1c.  Despite all of this, his lipids had not improved. -> He was subsequently placed on Repatha (after referral to [facility]) - started on 09/21/2021.   Recent Hospitalizations:  11/07/2021: ER - Dx with Influenza. ER admission [facility] 11/17/2021-11/20/2021 -1 week after diagnosis with influenza, presented with significant dyspnea.  Initially had exertional dyspnea followed by Orthopnea &PND.  Chest x-ray showed worsening opacities bilaterally and referred to ER.  Edema most notably worse during sleeping hours. => Echocardiogram showed EF of 20 to 25% with global and LAD distribution HK. => Cath showed fully occluded RCA with occluded graft to the RPDA and PL with patent LIMA-D3 -significant left to right collaterals filling the PDA. Converted from ARB to  Lisabeth Register increase to 10 mg daily, continued on carvedilol 6.25 mg twice daily and spironolactone 12 5 mg added. Discharged on 40 mg daily Lasix. Adderall discontinued. Plan to follow-up by the Advanced Heart Failure Team.  He was seen by the Heart Failure Follow-up team on December 12 (Amy Clegg, NP MAC.) ->  Noted mild exertional dyspnea but no PND orthopnea.  No chest pain.  Appetite doing well.  Home weight down to 190 pounds.  NYHA II-III Changes Lasix to sliding scale PRN for 3-4 pound weight gain. Unable to titrate medications as blood pressure low in the 90s.  Continued on current dose of carvedilol 6.25 mg daily, spironolactone 12.5 mg daily, Entresto 24-26 mg daily along with Farxiga 10 mg daily Plan was to follow-up echo in 3 months with consideration of possible CRT-D if EF still reduced. Concerns preference because of Farxiga, Repatha and Entresto-set up appointment with clinical social  worker.  Reviewed  CV studies:    The following studies were reviewed today: (if available, images/films reviewed: From Epic Chart or Care Everywhere) TTE 11/18/2021: Severe global HK.  Akinetic LAD territory.  EF 20 to 25%.  Moderate LV dilation.  GRII DD.  Moderate is RV function.  AOV sclerosis but no stenosis.  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  Trace AI.  Normal RAP. R&LHC 11/20/2021: Severe 2 V CAD - CTO of pLAD & mRCA. RPAD 95%. Mod (~50) distal LCx disease.  LIMA-D3 (Intramyocardial LAD) patent - backfills LAD. CTO of SVG-rPDA & SVG-RPL. Mild-Moderately elevated LVEDP. Normal RHC Pressures. Mildly reduced CO-CI by Fick.      Interval History:   Anthony Alvarez presents here today with his wife.  They are notably distressed about his recent CHF presentation.  He is then off-and-on having weird symptoms of "illness "for few days here than a few days there.  He said 3 to 4 days he feels that the increase feels better.  They are asking if there is any potential for him to have recurrent flu symptoms.  He has just generalized illness sensation off and on. He has not really had any PND orthopnea however since hospitalization.  His weights are stabilized out he had about 195 pounds.  He felt really dizzy and lightheaded with his pressures in the 190 range.  (Isolated today was 196 lb which correlates to 201 lb here).  He has minimal edema, usually goes down by the time he puts his feet up.  He has not had any sensation whatsoever chest pain or pressure with rest exertion.  He did not have anything prior to his hospitalization.  They are very concerned and upset about this grasping close and I tried to explain to him that this could be a mechanical issue or it could have been an issue with the downstream vessel.  I did reassure them that there was collateralization.  They asked him what we can do about opening up the arteries and that explained to him that it would be difficult CTO procedure to open up the RCA  which may or may not be of any benefit given collateralizations.  He does have dizziness mostly orthostatic with some fatigue.  They tell me that his home pressures are usually in the 1 10-1 20 range not in the 90s like it is today.  For the most part he has fatigue but not some much exertional dyspnea.  CV Review of Symptoms (Summary)  Cardiovascular ROS: positive for - dyspnea on exertion, edema, shortness of breath,  and overall stable to improved.  Mostly notes fatigue, exercise intolerance and generalized symptoms.  Orthostatic dizziness and lightheadedness negative for - chest pain, irregular heartbeat, orthopnea, palpitations, paroxysmal nocturnal dyspnea, rapid heart rate, or syncope/near syncope, TIA/ amaurosis fugax claudication  REVIEWED OF SYSTEMS   Review of Systems  Constitutional:  Positive for malaise/fatigue. Negative for weight loss.       No longer having fevers and chills sensation but does have generalized malaise and myalgias.  HENT:  Negative for congestion.   Respiratory:  Positive for shortness of breath. Negative for cough.   Cardiovascular:        Per HPI  Gastrointestinal:  Negative for blood in stool.  Genitourinary:  Negative for hematuria.  Musculoskeletal:  Positive for joint pain and myalgias (Please come and go including like he had the flu.). Negative for back pain.  Neurological:  Positive for dizziness and weakness (Generalized).  Psychiatric/Behavioral:  Positive for depression (depressed mood.). Negative for memory loss (Improved off of statin). The patient is not nervous/anxious and does not have insomnia.    I have reviewed and (if needed) personally updated the patient's problem list, medications, allergies, past medical and surgical history, social and family history.   PAST MEDICAL HISTORY   Past Medical History:  Diagnosis Date   Chronic HFrEF (heart failure with reduced ejection fraction) ([place]) 11/17/2021   EF down to 20-25% with Acute  Presentation of HFrEF - Global & LAD territory HK.   Coronary artery disease involving coronary bypass graft of native heart 08/12/2019   SVG-rPDA & SVG-RPL 100% occluded.   HLD (hyperlipidemia)    Hyperlipidemia associated with type 2 diabetes mellitus ([place]) 11/30/2019   Ischemic cardiomyopathy 11/30/2019   Post NSTEMI-CABG, EF increased from 35% to 45% => 11/2021 Acute on Chronic HFrEF -> EF downt to 20-25%. Cath with both SVGs to RPDA & PL along with native RCA CTO.  Patent LIMA-D3(LAD).  L-RPDA collaterals.  = titration of meds limited by Hypotension.   Multivessel CAD - 100% LAD (with Diag), 99% mRCA (subtotal occlusio) 08/12/2019   NSTEMI (non-ST elevated myocardial infarction) ([place]) 08/10/2019   S/P Off Pump CABG x 3 08/13/2019   LIMA to LAD RSVG to PDA RSVG to PLVB    PAST SURGICAL HISTORY   Past Surgical History:  Procedure Laterality Date   CORONARY ARTERY BYPASS GRAFT N/A 08/13/2019   Procedure: OFF PUMP CORONARY ARTERY BYPASS GRAFTING (CABG) x 2 WITH ENDOSCOPIC HARVESTING OF RIGHT GREATER SAPHENOUS VEIN;  Surgeon: [name], MD;  Location: [facility];  Service: Open Heart Surgery: LIMA-LAD, SVG-PDA, SVG-PL.   LEFT HEART CATH AND CORONARY ANGIOGRAPHY N/A 08/11/2019   Procedure: LEFT HEART CATH AND CORONARY ANGIOGRAPHY;  Surgeon: [name], [name], MD;  Location: [facility];  Service: Cardiovascular;  p-mLAD 100%, mRCA thrombotic 95%, rPAV 95%.  EF mod reduced, ~40%. EDP 24 mmHg --> CABG referral   RIGHT/LEFT HEART CATH AND CORONARY/GRAFT ANGIOGRAPHY N/A 11/20/2021   Procedure: RIGHT/LEFT HEART CATH AND CORONARY/GRAFT ANGIOGRAPHY;  Surgeon: Nelva Bush, MD;  Location: [facility];; Severe 2 V CAD - CTO of pLAD & mRCA. RPAD 95%. Mod (~50) distal LCx disease.  LIMA-D3 (Intramyocardial LAD) patent - backfills LAD. CTO of SVG-rPDA & SVG-RPL. Mild-Moderately elevated LVEDP. Normal RHC Pressures. Mildly reduced CO-CI by Fick.   TEE WITHOUT CARDIOVERSION  N/A 08/13/2019   Procedure: TRANSESOPHAGEAL ECHOCARDIOGRAM (TEE);  Surgeon: [name], MD;  Location: [facility];  Service: Open Heart Surgery;;;EF 30-40%.  Apical  akinesis.  Trace AI.  Mild MR.  Normal RV.    TRANSTHORACIC ECHOCARDIOGRAM  12/21/2019   EF improved to 45 and 50%.  Mild LVH.  Moderate HK of the mid anteroapical, inferoapical and inferolateral wall.  Septal motion consistent with LBBB.  Only GR 1 DD.  Moderate reduced RV function.  Normal atrial size.  Aortic root roughly 39 mm   TRANSTHORACIC ECHOCARDIOGRAM  08/11/2019   (non-STEMI) EF 35%.  Apical septal akinesis, apical akinesis.  Mid apical inferior akinesis and apical lateral akinesis.  GRIII DD.  Trivial MR.  Mild aortic root dilation-4.2 mL.   TRANSTHORACIC ECHOCARDIOGRAM Right 11/18/2021   Acute CHF presentation (in setting of Flu-PNA) => Severe global HK.  Akinetic LAD territory.  EF 20 to 25%.  Moderate LV dilation.  GRII DD.  Moderate is RV function.  AOV sclerosis but no stenosis.  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  Trace AI.  Normal RAP.    Immunization History  Administered Date(s) Administered   Tdap 06/02/2016    MEDICATIONS/ALLERGIES   Current Meds  Medication Sig   acetaminophen (TYLENOL) 500 MG tablet Take 500-1,000 mg by mouth every 8 (eight) hours as needed for mild pain.   aspirin EC 81 MG tablet Take 81 mg by mouth daily.   CALCIUM-MAGNESIUM-ZINC PO Take 1 tablet by mouth daily.   carvedilol (COREG) 6.25 MG tablet Take 1 tablet (6.25 mg total) by mouth 2 (two) times daily with a meal.   Cholecalciferol (VITAMIN D3) 50 MCG (2000 UT) capsule Take 2,000 Units by mouth daily.   dapagliflozin propanediol (FARXIGA) 10 MG TABS tablet Take 1 tablet (10 mg total) by mouth daily.   Evolocumab (REPATHA SURECLICK) 784 MG/ML SOAJ Inject 140 mg into the skin every 14 (fourteen) days.   furosemide (LASIX) 40 MG tablet Take 1 tablet (40 mg total) by mouth as needed.   glucose blood test strip TEST FOUR  TIMES DAILY OR AS DIRECTED   guaiFENesin (ROBITUSSIN) 100 MG/5ML liquid Take 5 mLs by mouth every 4 (four) hours as needed for cough or to loosen phlegm.   Lancets (ONETOUCH ULTRASOFT) lancets Use as instructed   MAGNESIUM PO Take 1 tablet by mouth daily.   Melatonin 10 MG CAPS Take 10 mg by mouth at bedtime as needed (sleep).   Multiple Vitamin (MULTI-VITAMIN) tablet Take 1 tablet by mouth daily.   sacubitril-valsartan (ENTRESTO) 24-26 MG Take 1 tablet by mouth 2 (two) times daily.   spironolactone (ALDACTONE) 25 MG tablet Take 1/2 tablet (12.5 mg total) by mouth daily.   terbinafine (LAMISIL) 250 MG tablet Take 250 mg by mouth See admin instructions. 221m oral daily for 7 days every month, last week of the month   Zinc 50 MG TABS Take 50 mg by mouth daily.    Allergies  Allergen Reactions   Atorvastatin Other (See Comments)    Memory deficit, fatigue, myalgias, insomnia; similar symptoms with rosuvastatin.   Icosapent Ethyl     lethargy   Rosuvastatin     Other reaction(s): Other (See Comments)  Intolerances: Rosuvastatin, Atorvastatin, Vascepa  SOCIAL HISTORY/FAMILY HISTORY   Reviewed in Epic:  Pertinent findings:  Social History   Tobacco Use   Smoking status: Former    Types: Cigarettes   Smokeless tobacco: Never   Social History   Social History Narrative   Not on file    OBJCTIVE -PE, EKG, labs   Wt Readings from Last 3 Encounters:  12/04/21 201 lb 3.2 oz (91.3 kg)  11/27/21  195 lb 12.8 oz (88.8 kg)  11/21/21 194 lb (88 kg)    Physical Exam: BP 94/60    Pulse 73    Ht _0  (1.778 m)    Wt 201 lb 3.2 oz (91.3 kg)    SpO2 98%    BMI 28.87 kg/m  Physical Exam Vitals reviewed.  Constitutional:      General: He is not in acute distress.    Appearance: Normal appearance. He is normal weight. He is not ill-appearing (Just seems to be somewhat down.) or toxic-appearing.  HENT:     Head: Normocephalic and atraumatic.  Neck:     Vascular: No  carotid bruit, hepatojugular reflux or JVD.  Cardiovascular:     Rate and Rhythm: Normal rate and regular rhythm. Occasional Extrasystoles are present.    Chest Wall: PMI is not displaced.     Pulses: Normal pulses and intact distal pulses.     Heart sounds: S1 normal and S2 normal. Heart sounds are distant. No murmur heard.   No friction rub. Gallop present. S4 sounds present.  Pulmonary:     Effort: Pulmonary effort is normal. No respiratory distress.     Breath sounds: Normal breath sounds.  Chest:     Chest wall: No tenderness.  Musculoskeletal:        General: No swelling. Normal range of motion.     Cervical back: Normal range of motion and neck supple.  Skin:    General: Skin is warm and dry.  Neurological:     General: No focal deficit present.     Mental Status: He is alert and oriented to person, place, and time.     Motor: No weakness.     Gait: Gait normal.  Psychiatric:        Thought Content: Thought content normal.        Judgment: Judgment normal.     Comments: Depressed mood.     Adult ECG Report Not checked   Recent Labs:  reviewed   Hemoglobin A1c 05/11/2021: 5.2. Lab Results  Component Value Date   CHOL 75 11/19/2021   HDL 30 (L) 11/19/2021   LDLCALC 18 11/19/2021   TRIG 134 11/19/2021   CHOLHDL 2.5 11/19/2021   Lab Results  Component Value Date   CREATININE 1.03 11/27/2021   BUN 24 (H) 11/27/2021   NA 136 11/27/2021   K 4.5 11/27/2021   CL 103 11/27/2021   CO2 23 11/27/2021   CBC Latest Ref Rng & Units 11/20/2021 11/20/2021 11/18/2021  WBC 4.0 - 10.5 K/uL - - 10.6(H)  Hemoglobin 13.0 - 17.0 g/dL 15.0 14.6 16.6  Hematocrit 39.0 - 52.0 % 44.0 43.0 48.1  Platelets 150 - 400 K/uL - - 280     Lab Results  Component Value Date   TSH 0.453 11/18/2021    ==================================================  COVID-19 Education: The signs and symptoms of COVID-19 were discussed with the patient and how to seek care for testing (follow up with  PCP or arrange E-visit).    I spent a total of 42 minutes with the patient spent in direct patient consultation.  Additional time spent with chart review  / charting (studies, outside notes, etc): 27 min Total Time: 69 min  Current medicines are reviewed at length with the patient today.    This visit occurred during the SARS-CoV-2 public health emergency.  Safety protocols were in place, including screening questions prior to the visit, additional usage of staff PPE, and extensive cleaning of  exam room while observing appropriate contact time as indicated for disinfecting solutions.  Notice: This dictation was prepared with Dragon dictation along with smart phrase technology. Any transcriptional errors that result from this process are unintentional and may not be corrected upon review.  Studies Ordered:   No orders of the defined types were placed in this encounter.   Patient Instructions / Medication Changes & Studies & Tests Ordered   Patient Instructions  Medication Instructions:    CHECK BLOOD PRESSURE  , IF SYSTOLIC BLOOD PRESSURE ( TOP NUMBER  IS 95 OR LESS) IN THE MORNING DO NOT TAKE SPIRONOLACTONE    IF BLOOD PRESSURE IS  95 OR LESS  THAN IN THE AFTERNOON OR  EVENING TAKE  1/2 TABLET OF ENTRESTO 24/26 MG    *If you need a refill on your cardiac medications before your next appointment, please call your pharmacy*   Lab Work:  NOT NEEDED   Testing/Procedures: NOT NEEDED   Follow-Up: At [facility], you and your health needs are our priority.  As part of our continuing mission to provide you with exceptional heart care, we have created designated Provider Care Teams.  These Care Teams include your primary Cardiologist (physician) and Advanced Practice Providers (APPs -  Physician Assistants and Nurse Practitioners) who all work together to provide you with the care you need, when you need it.     Your next appointment:   6 month(s)  The format for your next  appointment:   In Person  Provider:   Glenetta Hew, MD         Glenetta Hew, M.D., M.S. Interventional Cardiologist   Pager # [phone] Phone # [phone] [address] [place], [place] [address]   Thank you for choosing [facility] at [facility]!!

## 2021-12-05 ENCOUNTER — Encounter: Payer: Self-pay | Admitting: Cardiology

## 2021-12-05 DIAGNOSIS — I2510 Atherosclerotic heart disease of native coronary artery without angina pectoris: Secondary | ICD-10-CM | POA: Insufficient documentation

## 2021-12-05 NOTE — Assessment & Plan Note (Signed)
Thankfully, he was started on Repatha with the first lipid panel after starting Repatha being outstanding.  Some of this may be due to his acute illness, but LDL down to 18.  Continue Repatha.  Continue Farxiga for diabetes control-with last A1c of 5.2.  Being managed by PCP.

## 2021-12-05 NOTE — Assessment & Plan Note (Signed)
Intolerant of multiple different statins as indicated previously.  Was converted to Repatha with excellent lipid control.

## 2021-12-05 NOTE — Assessment & Plan Note (Addendum)
Intolerant of Vascepa up and fenofibrate.  Thankfully, on Repatha triglycerides are well controlled now 134.  Also benefiting from dietary modification, and glycemic control.[name]

## 2021-12-05 NOTE — Assessment & Plan Note (Addendum)
Unfortunately, his EF is now notably worse.  Interestingly, the echo was read as having LAD wall motion abnormality despite the fact abilities revascularized.  The LAD had been totally closed at the time of his non-STEMI.  It is now the RCA distribution that is being filled via collaterals from the grafted vessel.  Cannot be sure if the total drop in EF was simply ischemic versus also some component of myocarditis from influenza.  Plan is to reassess EF and 3 months which would be February in order to determine if EF has improved on optimize medical management. If his EF has not improved to greater than 35%, would likely refer for ICD for possible CRT-D given his LBBB.

## 2021-12-05 NOTE — Assessment & Plan Note (Addendum)
Now severe two-vessel CAD with occluded LAD and RCA.  No active angina symptoms per se mostly CHF symptoms of exertional dyspnea.  He does not recall having chest pain at the time of his MI either.   Plan:  Continue aspirin now Plavix. ->  For now okay to hold Plavix 7 days preop for surgery.  Statin intolerant-on Repatha with excellent lipid.  On max dose tolerated carvedilol and Entresto  With the RCA/PDA being perfused via retrograde collaterals from the LIMA-LAD, question if reperfusion in the RCA territory with antegrade flow would be beneficial.   I will discuss with Dr. [place] and Eldridge Dace about potential of CTO intervention on the RCA which would allow for perfusion of the RCA/RPDA allowing the LAD to be perfused without having to collateralized the RCA.

## 2021-12-05 NOTE — Assessment & Plan Note (Signed)
Recent exacerbation of CHF with now newly reduced EF back down to 20-25% after initially going to 45% post CABG.  Unfortunately, his entire RCA system is now filling via left-to-right collaterals with the RPL graft and RPDA graft closed as well as native RCA.  He has been started on Entresto along with carvedilol which we cannot titrate doses further because of hypotension. Is also on low-dose spironolactone plus Farxiga 10 mg.  This combination has kept EXTR only using Lasix as needed.  I reiterated the importance of sliding scale Lasix based on 3 pound weight gain.  He says he feels better with his weight normalizing at 195 as opposed to 1 90.  At that lower weight he was very dizzy and lightheaded and fatigued.  We sent his home dry weight at 195.  Unable to wear belts orthostatic hypotension and intermittent hypotension as it is today.  Indicated that if his blood pressures are running low i.e. <95 mmHg, he should hold spironolactone and take one half dose of Entresto.

## 2021-12-05 NOTE — Assessment & Plan Note (Signed)
He justifiably had questions about why these grafts closed so quickly.  Difficult to know since there is significant upstream RCA disease as well as in the RPA V.  Potentially mechanical issues with the grafts versus competitive flow collateralization.  Question benefit of CTO PCI of RCA.

## 2021-12-08 NOTE — Telephone Encounter (Signed)
Called patient to see if he was interested in participating in the Cardiac Rehab Program. Patient stated yes. Patient will come in for orientation on 01/02/2022@8 :30am and will attend the 10:30am exercise class.   Pensions consultant.

## 2021-12-15 NOTE — Progress Notes (Signed)
Advanced Heart Failure Clinic Note   Referring Physician: Dr End  Primary Care: Dr Tenny Craw Primary Cardiologist: Dr Herbie Baltimore  CT Surgery: Dr Cliffton Asters HF Cardiologist: Dr. Gala Romney  HPI:  Referred to clinic by Dr End for heart failure consultation.    Anthony Alvarez is a 65 year old with a history of CAD, S/P CABG 2020, DM, hyperlipidemia, ADHD previously on adderall at leat 13 years, and LBBB  .    Admitted 07/2019 with NSTEMI. LHC critical lesions of the RV branch 95%, RPA V lesion 95%, mid RCA 95% and proximal LAD 100%.  LVEF at that time was 35 to 45%.  He underwent CABG with LIMA to LAD, SVG to PDA, SVG to PL 08/13/2019 with Dr. Cliffton Asters.    In 2021 EF was up to 45-50%.    Of note he had the flu the week before admit. Admitted 11/17/21 with A/C HFrEF. Diuresed with IV lasix. Had cath with severe 2V CAD with CTO of proximal LAD and mild RCA + chronically occluded SVG-rPDA and SVG-rPL. ECHO EF 20-25% with moderately reduced EF. Recommendations for aggressive GDMT. Started Entresto and farxiga was increased to 10 mg daily. Of note adderall was stopped.    Recently presented to [facility] 11/27/21 with his wife. Overall was feeling fine. Noted mild SOB with exertion. Denied PND/Orthopnea. No chest pain. Appetite was ok. No fever or chills. Weight at home had gone down to 190 pounds. Reported taking all medications.  Today he returns to HF clinic for pharmacist medication titration. At last visit with APP, Lasix was decreased to PRN only (ReDS 30%). He was referred to [facility] for follow up. Overall he is feeling ok today. Has occasional dizziness when standing, which has been happening for the last 6-8 months. BP at home has been ~115/80 when taking Entresto. He ran out of Entresto 2 days ago, BP in clinic off Entresto was 120/84. Feels like he has been getting more SOB over the last week. Gets SOB with mild-moderate activity. Weight has been increasing. Believes his dry weight is 195-200 lbs,  weight has been steadily increasing over the last 5 days (now 207 lbs). Weight is up 12 lbs from last visit to clinic 4 weeks ago. Has taken PRN Lasix every day for the last 5 days without much effect. Has trace bilateral LEE, which is new for him. No PND/orthopnea. Is concerned about testosterone inhibiting effects of spironolactone.    HF Medications: Carvedilol 6.25 mg BID Entresto 24/26 mg BID - has been out for 2 days Spironolactone 12.5 mg daily Farxiga 10 mg daily Furosemide 40 mg PRN  Has the patient been experiencing any side effects to the medications prescribed?  Concerned about possible testosterone inhibiting effects of spironolactone. Is willing to try eplerenone.  Does the patient have any problems obtaining medications due to transportation or finances?   [employer] Medicare. Has grant for Repatha. Will add to PAN waitlist for HF fund for [place]. No issues affording copays at this time but will likely go into donut hole early in the year.   Understanding of regimen: good Understanding of indications: good Potential of compliance: good Patient understands to avoid NSAIDs. Patient understands to avoid decongestants.    Pertinent Lab Values: 11/27/21: Serum creatinine 1.03, BUN 24, Potassium 4.5, Sodium 136  Vital Signs: Weight: 207.8 lbs (last clinic weight: 195.8 lbs) Blood pressure: 120/84 (out of Entresto for 2 days)  Heart rate: 79   Assessment/Plan: Chronic HFrEF, ICM +  LBBB. Had the flu in November which could also contribute to cardiomyopathy. He is not longer taking adderall but would like restart. We previously asked to hold off due to reduced EF.  -Echo in 2021 EF was higher at 45-50%. Echo 11/18/21 EF was down to 20-25%.  -NYHA II-III. Volume status elevated on exam - Take Lasix 40 mg BID x2 days, then resume 40 mg PRN.  -Continue carvedilol 6.25 mg twice a day --> He prefers to not increase due to ED.  -Restart Entresto 24-26 mg BID -Wishes to stop  spironolactone due to possible testosterone inhibition. He is willing to try eplerenone. Start eplerenone 12.5 mg daily. Repeat BMET in 1 week.  - Continue Farxiga 10 mg daily - Optimize GDMT then plan to repeat ECHO in 3 months. May need CMRI as well if EF has not improved. - If EF remains < 35% could consider CRT-D.    2. LBBB -EKG 11/27/21: QRS 170 ms.  -If EF does not improve after 3 months could consider CRT-D   3.CAD -S/P CABG 2021 -[facility] 11/2021 severe 2V CAD with CTO of proximal LAD and mild RCA + chronically occluded SVG-rPDA and SVG-rPL. Intolerant to statins. On Repatha.  - on aspirin and carvedilol.  - No chest pain   4. DMII Followed by Deboraha Sprang PCP.       Follow up: BMET in 1 week, HF Clinic with Dr. Gala Romney in 1 month.    Karle Plumber, PharmD, BCPS, BCCP, CPP Heart Failure Clinic Pharmacist [phone]

## 2021-12-20 NOTE — Telephone Encounter (Signed)
Pt insurance is active and benefits verified through [facility] Medicare Co-pay 0, DED 0/0 met, out of pocket $3,600/0 met, co-insurance 0%. no pre-authorization required. Passport, 12/20/2021@10 :09am, REF# [id number]

## 2021-12-26 ENCOUNTER — Other Ambulatory Visit (HOSPITAL_COMMUNITY): Payer: Self-pay

## 2021-12-26 ENCOUNTER — Ambulatory Visit (HOSPITAL_COMMUNITY)
Admission: RE | Admit: 2021-12-26 | Discharge: 2021-12-26 | Disposition: A | Discharge status: Home / Self Care | Payer: Medicare Other | Source: Ambulatory Visit | Attending: Cardiology | Admitting: Cardiology

## 2021-12-26 ENCOUNTER — Other Ambulatory Visit: Payer: Self-pay

## 2021-12-26 VITALS — BP 120/84 | HR 79 | Wt 207.8 lb

## 2021-12-26 DIAGNOSIS — I5022 Chronic systolic (congestive) heart failure: Secondary | ICD-10-CM

## 2021-12-26 MED ORDER — SACUBITRIL-VALSARTAN 24-26 MG PO TABS: 1.0000 | ORAL_TABLET | Freq: Two times a day (BID) | ORAL | 5 refills | Status: DC

## 2021-12-26 MED ORDER — DAPAGLIFLOZIN PROPANEDIOL 10 MG PO TABS: 10.0000 mg | ORAL_TABLET | Freq: Every day | ORAL | 5 refills | Status: DC

## 2021-12-26 MED ORDER — FUROSEMIDE 40 MG PO TABS: 40.0000 mg | ORAL_TABLET | ORAL | 5 refills | Status: DC | PRN

## 2021-12-26 MED ORDER — CARVEDILOL 6.25 MG PO TABS: 6.2500 mg | ORAL_TABLET | Freq: Two times a day (BID) | ORAL | 5 refills | Status: DC

## 2021-12-26 MED ORDER — EPLERENONE 25 MG PO TABS: 12.5000 mg | ORAL_TABLET | Freq: Every day | ORAL | 5 refills | Status: DC

## 2021-12-26 NOTE — Patient Instructions (Addendum)
It was a pleasure seeing you today!  MEDICATIONS: -We are changing your medications today - Stop spironolactone and start eplerenone 12.5 mg (1/2 tablet) daily - Restart Entresto 24/26 mg (1 tablet) twice daily -Take Lasix 40 mg (1 tablet) twice daily for 2 days, then resume taking as needed for fluid retention.   -Call if you have questions about your medications.   NEXT APPOINTMENT: Return to clinic in 1 month with Dr. Gala Romney.  In general, to take care of your heart failure: -Limit your fluid intake to 2 Liters (half-gallon) per day.   -Limit your salt intake to ideally 2-3 grams (2000-3000 mg) per day. -Weigh yourself daily and record, and bring that "weight diary" to your next appointment.  (Weight gain of 2-3 pounds in 1 day typically means fluid weight.) -The medications for your heart are to help your heart and help you live longer.   -Please contact us before stopping any of your heart medications.  Call the clinic at [phone] with questions or to reschedule future appointments.

## 2022-01-01 ENCOUNTER — Telehealth (HOSPITAL_COMMUNITY): Payer: Self-pay | Admitting: *Deleted

## 2022-01-01 NOTE — Telephone Encounter (Signed)
Spoke with Anthony Alvarez. Completed health history confirmed appointment for orientation tomorrow.Gladstone Lighter, RN,BSN 01/01/2022 12:29 PM

## 2022-01-01 NOTE — Telephone Encounter (Signed)
Chat message exchange :   "Happy new year Amy!  The above pt referred to cardiac rehab by inpatient Cardiac rehab staff is scheduled for for orientation tomorrow. You saw him on 11/27/21 and Dr. Herbie Baltimore saw him on 12/19.  Noted his reduced EF. Will follow up with [facility] on 01/29/22 I assume he will also get an ECHO.  Ok to proceed as scheduled or should we hold from individualized exercise until he completes his follow up on 2/13??  Thanks for your input Takashi Korol"  Response from Tonye Becket NP  "yes please"  Return response:  "Thanks"

## 2022-01-02 ENCOUNTER — Other Ambulatory Visit: Payer: Self-pay

## 2022-01-02 ENCOUNTER — Ambulatory Visit (HOSPITAL_COMMUNITY)
Admission: RE | Admit: 2022-01-02 | Discharge: 2022-01-02 | Disposition: A | Discharge status: Home / Self Care | Payer: Medicare Other | Source: Ambulatory Visit | Attending: Internal Medicine | Admitting: Internal Medicine

## 2022-01-02 ENCOUNTER — Encounter (HOSPITAL_COMMUNITY): Payer: Self-pay

## 2022-01-02 ENCOUNTER — Encounter (HOSPITAL_COMMUNITY)
Admission: RE | Admit: 2022-01-02 | Discharge: 2022-01-02 | Disposition: A | Discharge status: Home / Self Care | Payer: Medicare Other | Source: Ambulatory Visit | Attending: Cardiology | Admitting: Cardiology

## 2022-01-02 VITALS — BP 102/64 | HR 84 | Ht 70.0 in | Wt 207.2 lb

## 2022-01-02 DIAGNOSIS — I5020 Unspecified systolic (congestive) heart failure: Secondary | ICD-10-CM

## 2022-01-02 DIAGNOSIS — E119 Type 2 diabetes mellitus without complications: Secondary | ICD-10-CM | POA: Insufficient documentation

## 2022-01-02 DIAGNOSIS — I5022 Chronic systolic (congestive) heart failure: Secondary | ICD-10-CM

## 2022-01-02 DIAGNOSIS — I5042 Chronic combined systolic (congestive) and diastolic (congestive) heart failure: Secondary | ICD-10-CM | POA: Insufficient documentation

## 2022-01-02 HISTORY — DX: Heart failure, unspecified: I50.9

## 2022-01-02 NOTE — Progress Notes (Signed)
Cardiac Individual Treatment Plan  Patient Details  Name: Anthony Alvarez MRN: [id number] Date of Birth: 1956/05/02 Referring Provider:   Flowsheet Row CARDIAC REHAB PHASE II ORIENTATION from 01/02/2022 in [facility] CARDIAC REHAB  Referring Provider Marykay Lex, MD       Initial Encounter Date:  Flowsheet Row CARDIAC REHAB PHASE II ORIENTATION from 01/02/2022 in [facility] CARDIAC REHAB  Date 01/02/22       Visit Diagnosis: HFrEF (heart failure with reduced ejection fraction) (HCC)  Patient's Home Medications on Admission:  Current Outpatient Medications:    acetaminophen (TYLENOL) 500 MG tablet, Take 500-1,000 mg by mouth every 8 (eight) hours as needed for mild pain., Disp: , Rfl:    amphetamine-dextroamphetamine (ADDERALL) 20 MG tablet, Take 10 mg by mouth 4 (four) times daily as needed (focus)., Disp: , Rfl:    aspirin EC 81 MG tablet, Take 81 mg by mouth daily., Disp: , Rfl:    B Complex Vitamins (B-COMPLEX/B-12 PO), Take 500 mg by mouth daily., Disp: , Rfl:    CALCIUM-MAGNESIUM-ZINC PO, Take 1 tablet by mouth daily., Disp: , Rfl:    Cholecalciferol (VITAMIN D3) 50 MCG (2000 UT) capsule, Take 2,000 Units by mouth daily., Disp: , Rfl:    Evolocumab (REPATHA SURECLICK) 140 MG/ML SOAJ, Inject 140 mg into the skin every 14 (fourteen) days., Disp: 6 mL, Rfl: 3   MAGNESIUM PO, Take 1 tablet by mouth daily., Disp: , Rfl:    Melatonin 10 MG CAPS, Take 10 mg by mouth at bedtime as needed (sleep)., Disp: , Rfl:    Menaquinone-7 (K2 PO), Take 100 mg by mouth daily., Disp: , Rfl:    Multiple Vitamin (MULTI-VITAMIN) tablet, Take 1 tablet by mouth daily., Disp: , Rfl:    sildenafil (VIAGRA) 50 MG tablet, Take 50 mg by mouth daily as needed for erectile dysfunction., Disp: , Rfl:    terbinafine (LAMISIL) 250 MG tablet, Take 250 mg by mouth See admin instructions. 250 mg oral daily for 7 days every month, last week of the month, Disp: , Rfl:    Zinc 50  MG TABS, Take 50 mg by mouth daily., Disp: , Rfl:    carvedilol (COREG) 6.25 MG tablet, Take 1 tablet (6.25 mg total) by mouth 2 (two) times daily with a meal., Disp: 60 tablet, Rfl: 5   dapagliflozin propanediol (FARXIGA) 10 MG TABS tablet, Take 1 tablet (10 mg total) by mouth daily., Disp: 30 tablet, Rfl: 5   eplerenone (INSPRA) 25 MG tablet, Take 0.5 tablets (12.5 mg total) by mouth daily., Disp: 15 tablet, Rfl: 5   furosemide (LASIX) 40 MG tablet, Take 1 tablet (40 mg total) by mouth as needed., Disp: 30 tablet, Rfl: 5   glucose blood test strip, TEST FOUR TIMES DAILY OR AS DIRECTED, Disp: 100 each, Rfl: 1   Lancets (ONETOUCH ULTRASOFT) lancets, Use as instructed, Disp: 100 each, Rfl: 1   niacin 500 MG tablet, Take 500 mg by mouth at bedtime., Disp: , Rfl:    sacubitril-valsartan (ENTRESTO) 24-26 MG, Take 1 tablet by mouth 2 (two) times daily., Disp: 60 tablet, Rfl: 5  Past Medical History: Past Medical History:  Diagnosis Date   CHF (congestive heart failure) (HCC)    Chronic HFrEF (heart failure with reduced ejection fraction) (HCC) 11/17/2021   EF down to 20-25% with Acute Presentation of HFrEF - Global & LAD territory HK.   Coronary artery disease involving coronary bypass graft of native heart 08/12/2019  SVG-rPDA & SVG-RPL 100% occluded.   HLD (hyperlipidemia)    Hyperlipidemia associated with type 2 diabetes mellitus (HCC) 11/30/2019   Ischemic cardiomyopathy 11/30/2019   Post NSTEMI-CABG, EF increased from 35% to 45% => 11/2021 Acute on Chronic HFrEF -> EF downt to 20-25%. Cath with both SVGs to RPDA & PL along with native RCA CTO.  Patent LIMA-D3(LAD).  L-RPDA collaterals.  = titration of meds limited by Hypotension.   Multivessel CAD - 100% LAD (with Diag), 99% mRCA (subtotal occlusio) 08/12/2019   NSTEMI (non-ST elevated myocardial infarction) (HCC) 08/10/2019   S/P Off Pump CABG x 3 08/13/2019   LIMA to LAD RSVG to PDA RSVG to PLVB    Tobacco Use: Social History    Tobacco Use  Smoking Status Former   Types: Cigars  Smokeless Tobacco Never    Labs: Recent Review Flowsheet Data     Labs for ITP Cardiac and Pulmonary Rehab Latest Ref Rng & Units 12/21/2019 08/24/2021 11/19/2021 11/20/2021 11/20/2021   Cholestrol 0 - 200 mg/dL 61(W) 431(V) 75 - -   LDLCALC 0 - 99 mg/dL 27 400(Q) 18 - -   HDL >40 mg/dL 67(Y) 19(J) 09(T) - -   Trlycerides <150 mg/dL 267(T) 245(Y) 099 - -   Hemoglobin A1c 4.8 - 5.6 % - - - - -   PHART 7.350 - 7.450 - - - - 7.420   PCO2ART 32.0 - 48.0 mmHg - - - - 35.0   HCO3 20.0 - 28.0 mmol/L - - - 24.3 22.7   TCO2 22 - 32 mmol/L - - - 26 24   ACIDBASEDEF 0.0 - 2.0 mmol/L - - - 1.0 1.0   O2SAT % - - - 66.0 94.0       Capillary Blood Glucose: Lab Results  Component Value Date   GLUCAP 145 (H) 08/17/2019   GLUCAP 143 (H) 08/16/2019   GLUCAP 171 (H) 08/16/2019   GLUCAP 211 (H) 08/16/2019   GLUCAP 145 (H) 08/16/2019     Exercise Target Goals: Exercise Program Goal: Individual exercise prescription set using results from initial 6 min walk test and THRR while considering  patients activity barriers and safety.   Exercise Prescription Goal: Starting with aerobic activity 30 plus minutes a day, 3 days per week for initial exercise prescription. Provide home exercise prescription and guidelines that participant acknowledges understanding prior to discharge.  Activity Barriers & Risk Stratification:  Activity Barriers & Cardiac Risk Stratification - 01/02/22 0919       Activity Barriers & Cardiac Risk Stratification   Activity Barriers Assistive Device;History of Falls    Cardiac Risk Stratification High             6 Minute Walk:  6 Minute Walk     Row Name 01/02/22 0930         6 Minute Walk   Phase Initial     Distance 1645 feet     Walk Time 6 minutes     # of Rest Breaks 0     MPH 3.11     METS 3.64     RPE 11     Perceived Dyspnea  1     VO2 Peak 12.75     Symptoms Yes (comment)     Comments Mild  SOB     Resting HR 84 bpm     Resting BP 102/64     Resting Oxygen Saturation  96 %     Exercise Oxygen Saturation  during 6 min  walk 98 %     Max Ex. HR 119 bpm     Max Ex. BP 118/72     2 Minute Post BP 120/70              Oxygen Initial Assessment:   Oxygen Re-Evaluation:   Oxygen Discharge (Final Oxygen Re-Evaluation):   Initial Exercise Prescription:  Initial Exercise Prescription - 01/02/22 0900       Date of Initial Exercise RX and Referring Provider   Date 01/02/22    Referring Provider Marykay LexHarding, David W, MD    Expected Discharge Date 03/02/22      Bike   Level 1.3    Minutes 15    METs 3.61      NuStep   Level 3    SPM 85    Minutes 15    METs 2.8      Prescription Details   Frequency (times per week) 3    Duration Progress to 30 minutes of continuous aerobic without signs/symptoms of physical distress      Intensity   THRR 40-80% of Max Heartrate 61-122    Ratings of Perceived Exertion 11-13    Perceived Dyspnea 0-4      Progression   Progression Continue to progress workloads to maintain intensity without signs/symptoms of physical distress.      Resistance Training   Training Prescription Yes    Weight 4 lbs    Reps 10-15             Perform Capillary Blood Glucose checks as needed.  Exercise Prescription Changes:   Exercise Comments:   Exercise Goals and Review:   Exercise Goals     Row Name 01/02/22 0848             Exercise Goals   Increase Physical Activity Yes       Intervention Provide advice, education, support and counseling about physical activity/exercise needs.;Develop an individualized exercise prescription for aerobic and resistive training based on initial evaluation findings, risk stratification, comorbidities and participant's personal goals.       Expected Outcomes Short Term: Attend rehab on a regular basis to increase amount of physical activity.;Long Term: Exercising regularly at least 3-5 days a  week.;Long Term: Add in home exercise to make exercise part of routine and to increase amount of physical activity.       Increase Strength and Stamina Yes       Intervention Provide advice, education, support and counseling about physical activity/exercise needs.;Develop an individualized exercise prescription for aerobic and resistive training based on initial evaluation findings, risk stratification, comorbidities and participant's personal goals.       Expected Outcomes Short Term: Increase workloads from initial exercise prescription for resistance, speed, and METs.;Short Term: Perform resistance training exercises routinely during rehab and add in resistance training at home;Long Term: Improve cardiorespiratory fitness, muscular endurance and strength as measured by increased METs and functional capacity (6MWT)       Able to understand and use rate of perceived exertion (RPE) scale Yes       Intervention Provide education and explanation on how to use RPE scale       Expected Outcomes Short Term: Able to use RPE daily in rehab to express subjective intensity level;Long Term:  Able to use RPE to guide intensity level when exercising independently       Knowledge and understanding of Target Heart Rate Range (THRR) Yes       Intervention Provide education and  explanation of THRR including how the numbers were predicted and where they are located for reference       Expected Outcomes Long Term: Able to use THRR to govern intensity when exercising independently;Short Term: Able to state/look up THRR;Short Term: Able to use daily as guideline for intensity in rehab       Able to check pulse independently Yes       Intervention Provide education and demonstration on how to check pulse in carotid and radial arteries.;Review the importance of being able to check your own pulse for safety during independent exercise       Expected Outcomes Short Term: Able to explain why pulse checking is important during  independent exercise;Long Term: Able to check pulse independently and accurately       Understanding of Exercise Prescription Yes       Intervention Provide education, explanation, and written materials on patient's individual exercise prescription       Expected Outcomes Short Term: Able to explain program exercise prescription;Long Term: Able to explain home exercise prescription to exercise independently                Exercise Goals Re-Evaluation :    Discharge Exercise Prescription (Final Exercise Prescription Changes):   Nutrition:  Target Goals: Understanding of nutrition guidelines, daily intake of sodium 1500mg , cholesterol 200mg , calories 30% from fat and 7% or less from saturated fats, daily to have 5 or more servings of fruits and vegetables.  Biometrics:  Pre Biometrics - 01/02/22 0847       Pre Biometrics   Waist Circumference 39 inches    Hip Circumference 41 inches    Waist to Hip Ratio 0.95 %    Triceps Skinfold 24 mm    % Body Fat 29.5 %    Grip Strength 46 kg    Flexibility 9 in    Single Leg Stand 30 seconds              Nutrition Therapy Plan and Nutrition Goals:   Nutrition Assessments:  MEDIFICTS Score Key: ?70 Need to make dietary changes  40-70 Heart Healthy Diet ? 40 Therapeutic Level Cholesterol Diet   Picture Your Plate Scores: <19 Unhealthy dietary pattern with much room for improvement. 41-50 Dietary pattern unlikely to meet recommendations for good health and room for improvement. 51-60 More healthful dietary pattern, with some room for improvement.  >60 Healthy dietary pattern, although there may be some specific behaviors that could be improved.    Nutrition Goals Re-Evaluation:   Nutrition Goals Discharge (Final Nutrition Goals Re-Evaluation):   Psychosocial: Target Goals: Acknowledge presence or absence of significant depression and/or stress, maximize coping skills, provide positive support system. Participant is  able to verbalize types and ability to use techniques and skills needed for reducing stress and depression.  Initial Review & Psychosocial Screening:  Initial Psych Review & Screening - 01/02/22 1478       Initial Review   Current issues with Current Stress Concerns;History of Depression    Source of Stress Concerns Chronic Illness    Comments Anthony Alvarez is concerned about his health due to his heart issues.      Family Dynamics   Good Support System? Yes   Anthony Alvarez has his wife for support     Barriers   Psychosocial barriers to participate in program The patient should benefit from training in stress management and relaxation.      Screening Interventions   Interventions Encouraged to exercise;Provide feedback about  the scores to participant;To provide support and resources with identified psychosocial needs    Expected Outcomes Long Term Goal: Stressors or current issues are controlled or eliminated.;Short Term goal: Identification and review with participant of any Quality of Life or Depression concerns found by scoring the questionnaire.;Long Term goal: The participant improves quality of Life and PHQ9 Scores as seen by post scores and/or verbalization of changes             Quality of Life Scores:  Quality of Life - 01/02/22 1022       Quality of Life   Select Quality of Life      Quality of Life Scores   Health/Function Pre 17.47 %    Socioeconomic Pre 25.43 %    Psych/Spiritual Pre 25.79 %    Family Pre 24 %    GLOBAL Pre 21.78 %            Scores of 19 and below usually indicate a poorer quality of life in these areas.  A difference of  2-3 points is a clinically meaningful difference.  A difference of 2-3 points in the total score of the Quality of Life Index has been associated with significant improvement in overall quality of life, self-image, physical symptoms, and general health in studies assessing change in quality of life.  PHQ-9: Recent Review Flowsheet Data      Depression screen [facility] 01/02/2022   Decreased Interest 0   Down, Depressed, Hopeless 0   PHQ - 2 Score 0      Interpretation of Total Score  Total Score Depression Severity:  1-4 = Minimal depression, 5-9 = Mild depression, 10-14 = Moderate depression, 15-19 = Moderately severe depression, 20-27 = Severe depression   Psychosocial Evaluation and Intervention:   Psychosocial Re-Evaluation:   Psychosocial Discharge (Final Psychosocial Re-Evaluation):   Vocational Rehabilitation: Provide vocational rehab assistance to qualifying candidates.   Vocational Rehab Evaluation & Intervention:  Vocational Rehab - 01/02/22 0924       Initial Vocational Rehab Evaluation & Intervention   Assessment shows need for Vocational Rehabilitation No   Anthony StableBill is retired and does not need vocaitonal rehab at this time            Education: Education Goals: Education classes will be provided on a weekly basis, covering required topics. Participant will state understanding/return demonstration of topics presented.  Learning Barriers/Preferences:  Learning Barriers/Preferences - 01/02/22 1216       Learning Barriers/Preferences   Learning Barriers Exercise Concerns   Anthony Alvarez sometimes has dizziness due to his medications   Learning Preferences Skilled Demonstration             Education Topics: Hypertension, Hypertension Reduction -Define heart disease and high blood pressure. Discus how high blood pressure affects the body and ways to reduce high blood pressure.   Exercise and Your Heart -Discuss why it is important to exercise, the FITT principles of exercise, normal and abnormal responses to exercise, and how to exercise safely.   Angina -Discuss definition of angina, causes of angina, treatment of angina, and how to decrease risk of having angina.   Cardiac Medications -Review what the following cardiac medications are used for, how they affect the body, and side effects  that may occur when taking the medications.  Medications include Aspirin, Beta blockers, calcium channel blockers, ACE Inhibitors, angiotensin receptor blockers, diuretics, digoxin, and antihyperlipidemics.   Congestive Heart Failure -Discuss the definition of CHF, how to live with CHF, the signs  and symptoms of CHF, and how keep track of weight and sodium intake.   Heart Disease and Intimacy -Discus the effect sexual activity has on the heart, how changes occur during intimacy as we age, and safety during sexual activity.   Smoking Cessation / COPD -Discuss different methods to quit smoking, the health benefits of quitting smoking, and the definition of COPD.   Nutrition I: Fats -Discuss the types of cholesterol, what cholesterol does to the heart, and how cholesterol levels can be controlled.   Nutrition II: Labels -Discuss the different components of food labels and how to read food label   Heart Parts/Heart Disease and PAD -Discuss the anatomy of the heart, the pathway of blood circulation through the heart, and these are affected by heart disease.   Stress I: Signs and Symptoms -Discuss the causes of stress, how stress may lead to anxiety and depression, and ways to limit stress.   Stress II: Relaxation -Discuss different types of relaxation techniques to limit stress.   Warning Signs of Stroke / TIA -Discuss definition of a stroke, what the signs and symptoms are of a stroke, and how to identify when someone is having stroke.   Knowledge Questionnaire Score:  Knowledge Questionnaire Score - 01/02/22 1020       Knowledge Questionnaire Score   Pre Score 20/24             Core Components/Risk Factors/Patient Goals at Admission:  Personal Goals and Risk Factors at Admission - 01/02/22 0849       Core Components/Risk Factors/Patient Goals on Admission    Weight Management Yes;Weight Loss    Intervention Weight Management: Provide education and appropriate  resources to help participant work on and attain dietary goals.    Admit Weight 207 lb 3.7 oz (94 kg)    Goal Weight: Short Term 201 lb (91.2 kg)    Goal Weight: Long Term 185 lb (83.9 kg)    Expected Outcomes Short Term: Continue to assess and modify interventions until short term weight is achieved;Long Term: Adherence to nutrition and physical activity/exercise program aimed toward attainment of established weight goal;Weight Loss: Understanding of general recommendations for a balanced deficit meal plan, which promotes 1-2 lb weight loss per week and includes a negative energy balance of [phone] kcal/d    Diabetes Yes    Intervention Provide education about signs/symptoms and action to take for hypo/hyperglycemia.;Provide education about proper nutrition, including hydration, and aerobic/resistive exercise prescription along with prescribed medications to achieve blood glucose in normal ranges: Fasting glucose 65-99 mg/dL    Expected Outcomes Short Term: Participant verbalizes understanding of the signs/symptoms and immediate care of hyper/hypoglycemia, proper foot care and importance of medication, aerobic/resistive exercise and nutrition plan for blood glucose control.;Long Term: Attainment of HbA1C < 7%.    Heart Failure Yes    Intervention Provide a combined exercise and nutrition program that is supplemented with education, support and counseling about heart failure. Directed toward relieving symptoms such as shortness of breath, decreased exercise tolerance, and extremity edema.    Expected Outcomes Improve functional capacity of life;Short term: Attendance in program 2-3 days a week with increased exercise capacity. Reported lower sodium intake. Reported increased fruit and vegetable intake. Reports medication compliance.;Short term: Daily weights obtained and reported for increase. Utilizing diuretic protocols set by physician.;Long term: Adoption of self-care skills and reduction of barriers  for early signs and symptoms recognition and intervention leading to self-care maintenance.    Hypertension Yes    Intervention  Provide education on lifestyle modifcations including regular physical activity/exercise, weight management, moderate sodium restriction and increased consumption of fresh fruit, vegetables, and low fat dairy, alcohol moderation, and smoking cessation.;Monitor prescription use compliance.    Expected Outcomes Short Term: Continued assessment and intervention until BP is < 140/63mm HG in hypertensive participants. < 130/81mm HG in hypertensive participants with diabetes, heart failure or chronic kidney disease.;Long Term: Maintenance of blood pressure at goal levels.    Lipids Yes    Intervention Provide education and support for participant on nutrition & aerobic/resistive exercise along with prescribed medications to achieve LDL 70mg , HDL >40mg .    Expected Outcomes Short Term: Participant states understanding of desired cholesterol values and is compliant with medications prescribed. Participant is following exercise prescription and nutrition guidelines.;Long Term: Cholesterol controlled with medications as prescribed, with individualized exercise RX and with personalized nutrition plan. Value goals: LDL < 70mg , HDL > 40 mg.    Stress Yes    Intervention Offer individual and/or small group education and counseling on adjustment to heart disease, stress management and health-related lifestyle change. Teach and support self-help strategies.;Refer participants experiencing significant psychosocial distress to appropriate mental health specialists for further evaluation and treatment. When possible, include family members and significant others in education/counseling sessions.    Expected Outcomes Short Term: Participant demonstrates changes in health-related behavior, relaxation and other stress management skills, ability to obtain effective social support, and compliance with  psychotropic medications if prescribed.;Long Term: Emotional wellbeing is indicated by absence of clinically significant psychosocial distress or social isolation.    Personal Goal Other Yes    Personal Goal Build collateral circulation    Intervention Provide individualized aerobic exercise program to help improve collateral circulation.    Expected Outcomes Patient will begin and maintain a regular aerobic exercise routine.             Core Components/Risk Factors/Patient Goals Review:    Core Components/Risk Factors/Patient Goals at Discharge (Final Review):    ITP Comments:  ITP Comments     Row Name 01/02/22 0847 01/02/22 0919         ITP Comments Medical Director- Dr. Armanda Magic, MD --               Comments: Anthony Alvarez attended orientation on 01/02/2022 to review rules and guidelines for program.  Completed 6 minute walk test, Intitial ITP, and exercise prescription.  VSS. Telemetry-Sinus Rhythm, Bundle Branch Block. Anthony Alvarez reported experiencing mild shortness of breath at the end of his walk test other wise.  Asymptomatic. Safety measures and social distancing in place per CDC guidelines.Thayer Headings RN BSN

## 2022-01-02 NOTE — Progress Notes (Signed)
Cardiac Rehab Medication Review by a Pharmacist  Does the patient  feel that his/her medications are working for him/her?  YES  Has the patient been experiencing any side effects to the medications prescribed?  NO  Does the patient measure his/her own blood pressure or blood glucose at home?  YES   Does the patient have any problems obtaining medications due to transportation or finances?  NO  Understanding of regimen: good Understanding of indications: poor Potential of compliance: good    Nurse comments: Anthony Alvarez is taking his medications as prescribed and says he does not  understand what his  cardiac medications are for. Reviewed the indications of most of Anthony Alvarez's medications with him and his wife. Anthony Alvarez says he is taking his medications as prescribed. Anthony Alvarez monitor's his blood pressures at home.    Anthony Bruce Praise Stennett RN 01/02/2022 9:31 AM

## 2022-01-05 ENCOUNTER — Other Ambulatory Visit (HOSPITAL_COMMUNITY): Payer: Self-pay

## 2022-01-08 ENCOUNTER — Other Ambulatory Visit: Payer: Self-pay

## 2022-01-08 ENCOUNTER — Encounter (HOSPITAL_COMMUNITY)
Admission: RE | Admit: 2022-01-08 | Discharge: 2022-01-08 | Disposition: A | Discharge status: Home / Self Care | Payer: Medicare Other | Source: Ambulatory Visit | Attending: Cardiology | Admitting: Cardiology

## 2022-01-08 DIAGNOSIS — I5042 Chronic combined systolic (congestive) and diastolic (congestive) heart failure: Secondary | ICD-10-CM | POA: Diagnosis present

## 2022-01-08 DIAGNOSIS — I5020 Unspecified systolic (congestive) heart failure: Secondary | ICD-10-CM

## 2022-01-08 DIAGNOSIS — E119 Type 2 diabetes mellitus without complications: Secondary | ICD-10-CM | POA: Diagnosis not present

## 2022-01-08 LAB — GLUCOSE, CAPILLARY
Glucose-Capillary: 118 mg/dL — ABNORMAL HIGH (ref 70–99)
Glucose-Capillary: 103 mg/dL — ABNORMAL HIGH (ref 70–99)

## 2022-01-08 NOTE — Progress Notes (Signed)
Daily Session Note  Patient Details  Name: Anthony Alvarez MRN: [id number] Date of Birth: March 27, 1956 Referring Provider:   Flowsheet Row CARDIAC REHAB PHASE II ORIENTATION from 01/02/2022 in [place]  Referring Provider Anthony Man, MD       Encounter Date: 01/08/2022  Check In:  Session Check In - 01/08/22 1100       Check-In   Supervising physician immediately available to respond to emergencies [facility] immediately available    Physician(s) Dr. Kurtis Alvarez    Location [facility]    Staff Present Anthony Pall, RN, BSN;Anthony Dredge, RN, MHA;Anthony Celesta Aver, MS, ACSM CEP, Exercise Physiologist;Anthony Lilyan Punt, MS, ACSM-CEP, CCRP, Exercise Physiologist;Anthony Wilber Oliphant, RN, BSN    Virtual Visit No    Medication changes reported     No    Fall or balance concerns reported    No    Tobacco Cessation No Change    Warm-up and Cool-down Performed as group-led instruction    Resistance Training Performed Yes    VAD Patient? No    PAD/SET Patient? No      Pain Assessment   Currently in Pain? No/denies    Pain Score 0-No pain    Multiple Pain Sites No             Capillary Blood Glucose: Results for orders placed or performed during the hospital encounter of 01/08/22 (from the past 24 hour(s))  Glucose, capillary     Status: Abnormal   Collection Time: 01/08/22 11:19 AM  Result Value Ref Range   Glucose-Capillary 103 (H) 70 - 99 mg/dL     Exercise Prescription Changes - 01/08/22 1021       Response to Exercise   Blood Pressure (Admit) 98/60    Blood Pressure (Exercise) 118/70    Blood Pressure (Exit) 97/68    Heart Rate (Admit) 81 bpm    Heart Rate (Exercise) 114 bpm    Heart Rate (Exit) 91 bpm    Oxygen Saturation (Exercise) 99 %    Rating of Perceived Exertion (Exercise) 13    Perceived Dyspnea (Exercise) 1.5    Symptoms Mild SOB, lightheaded    Comments Patient tolerated low intensity exercise  fair, c/o SOB, lightheaded on stationary bike.    Duration Progress to 30 minutes of  aerobic without signs/symptoms of physical distress    Intensity THRR unchanged      Progression   Progression Continue to progress workloads to maintain intensity without signs/symptoms of physical distress.    Average METs 2.8      Resistance Training   Training Prescription Yes    Weight 4 lbs    Reps 10-15    Time 10 Minutes      Interval Training   Interval Training No      Bike   Level 1.3    Minutes 12   Stopped early due to lightheadedness and SOB.   METs 3.58      NuStep   Level 3    SPM 85    Minutes 15    METs 2             Social History   Tobacco Use  Smoking Status Former   Types: Cigars  Smokeless Tobacco Never    Goals Met:  Strength training completed today  Goals Unmet:  Not Applicable   Comments: Anthony Alvarez started cardiac rehab today.  Pt tolerated light exercise without difficulty. VSS, telemetry-,Sinus rhythm, bundle branch block .  Anthony Alvarez reported feeling lightheaded when getting off the airdyne. Blood pressure 118/70. Anthony Alvarez said he only drank coffee in the morning prior to coming to exercise. Anthony Alvarez was given water and was encouraged to drink water prior to coming to exercise.Patient states understanding. Exit BP 97/68 sitting. Standing BP 99/68. Will continue to monitor BP Medication list reconciled. Pt denies barriers to medicaiton compliance.  PSYCHOSOCIAL ASSESSMENT:  PHQ-0. Pt exhibits positive coping skills, hopeful outlook with supportive family. QUALITY OF LIFE SCORE REVIEW  Pt completed Quality of Life survey as a participant in Cardiac Rehab.  Scores 21.0 or below are considered low.  Pt score very low in several areas Overall 21.78, Health and Function 17.47, socioeconomic 25.43, physiological and spiritual 25.79, family 35. Patient quality of life slightly altered by physical constraints which limits ability to perform as prior to recent cardiac illness.   Anthony Alvarez denies being depressed but is dissatisfied with his health due to cardiac issues.  Offered emotional support and reassurance.  Will continue to monitor and intervene as necessary.       Pt enjoys playing his guitar and panning for gold.   Pt oriented to exercise equipment and routine.    Understanding verbalized. Anthony Gave RN BSN    Anthony Alvarez is Medical Director for Cardiac Rehab at [facility].

## 2022-01-10 ENCOUNTER — Other Ambulatory Visit: Payer: Self-pay

## 2022-01-10 ENCOUNTER — Encounter (HOSPITAL_COMMUNITY)
Admission: RE | Admit: 2022-01-10 | Discharge: 2022-01-10 | Disposition: A | Discharge status: Home / Self Care | Payer: Medicare Other | Source: Ambulatory Visit | Attending: Cardiology | Admitting: Cardiology

## 2022-01-10 DIAGNOSIS — I5042 Chronic combined systolic (congestive) and diastolic (congestive) heart failure: Secondary | ICD-10-CM | POA: Diagnosis not present

## 2022-01-10 DIAGNOSIS — I5020 Unspecified systolic (congestive) heart failure: Secondary | ICD-10-CM

## 2022-01-10 LAB — GLUCOSE, CAPILLARY: Glucose-Capillary: 121 mg/dL — ABNORMAL HIGH (ref 70–99)

## 2022-01-11 ENCOUNTER — Telehealth (HOSPITAL_COMMUNITY): Payer: Self-pay | Admitting: Pharmacist

## 2022-01-11 NOTE — Telephone Encounter (Signed)
Was successful in securing patient an $74 grant from Patient [employer] [place]) to provide copayment coverage for [place].  This will keep the out of pocket expense at $0.     I have spoken with the patient.    The billing information is as follows:   Member ID: [id number] Group ID: [id number] RxBin: [id number] PCN: [id number] Dates of Eligibility:  10/11/2021 - 01/08/2023  Fund:  Heart Failure  Karle Plumber, PharmD, BCPS, BCCP, CPP Heart Failure Clinic Pharmacist [phone]

## 2022-01-12 ENCOUNTER — Other Ambulatory Visit: Payer: Self-pay

## 2022-01-12 ENCOUNTER — Encounter (HOSPITAL_COMMUNITY)
Admission: RE | Admit: 2022-01-12 | Discharge: 2022-01-12 | Disposition: A | Discharge status: Home / Self Care | Payer: Medicare Other | Source: Ambulatory Visit | Attending: Cardiology | Admitting: Cardiology

## 2022-01-12 DIAGNOSIS — I5020 Unspecified systolic (congestive) heart failure: Secondary | ICD-10-CM

## 2022-01-12 DIAGNOSIS — I5042 Chronic combined systolic (congestive) and diastolic (congestive) heart failure: Secondary | ICD-10-CM | POA: Diagnosis not present

## 2022-01-15 ENCOUNTER — Other Ambulatory Visit: Payer: Self-pay

## 2022-01-15 ENCOUNTER — Encounter (HOSPITAL_COMMUNITY)
Admission: RE | Admit: 2022-01-15 | Discharge: 2022-01-15 | Disposition: A | Discharge status: Home / Self Care | Payer: Medicare Other | Source: Ambulatory Visit | Attending: Cardiology | Admitting: Cardiology

## 2022-01-15 DIAGNOSIS — I5042 Chronic combined systolic (congestive) and diastolic (congestive) heart failure: Secondary | ICD-10-CM | POA: Diagnosis not present

## 2022-01-15 DIAGNOSIS — I5020 Unspecified systolic (congestive) heart failure: Secondary | ICD-10-CM

## 2022-01-17 ENCOUNTER — Encounter (HOSPITAL_COMMUNITY)
Admission: RE | Admit: 2022-01-17 | Discharge: 2022-01-17 | Disposition: A | Discharge status: Home / Self Care | Payer: Medicare Other | Source: Ambulatory Visit | Attending: Cardiology | Admitting: Cardiology

## 2022-01-17 ENCOUNTER — Other Ambulatory Visit: Payer: Self-pay

## 2022-01-17 DIAGNOSIS — Z5189 Encounter for other specified aftercare: Secondary | ICD-10-CM | POA: Insufficient documentation

## 2022-01-17 DIAGNOSIS — I5020 Unspecified systolic (congestive) heart failure: Secondary | ICD-10-CM | POA: Insufficient documentation

## 2022-01-17 DIAGNOSIS — I255 Ischemic cardiomyopathy: Secondary | ICD-10-CM | POA: Diagnosis not present

## 2022-01-19 ENCOUNTER — Other Ambulatory Visit: Payer: Self-pay

## 2022-01-19 ENCOUNTER — Encounter (HOSPITAL_COMMUNITY)
Admission: RE | Admit: 2022-01-19 | Discharge: 2022-01-19 | Disposition: A | Discharge status: Home / Self Care | Payer: Medicare Other | Source: Ambulatory Visit | Attending: Cardiology | Admitting: Cardiology

## 2022-01-19 DIAGNOSIS — Z5189 Encounter for other specified aftercare: Secondary | ICD-10-CM | POA: Diagnosis not present

## 2022-01-19 DIAGNOSIS — I5020 Unspecified systolic (congestive) heart failure: Secondary | ICD-10-CM

## 2022-01-21 ENCOUNTER — Other Ambulatory Visit (HOSPITAL_COMMUNITY): Payer: Self-pay | Admitting: Adult Health

## 2022-01-22 ENCOUNTER — Encounter (HOSPITAL_COMMUNITY)
Admission: RE | Admit: 2022-01-22 | Discharge: 2022-01-22 | Disposition: A | Discharge status: Home / Self Care | Payer: Medicare Other | Source: Ambulatory Visit | Attending: Cardiology | Admitting: Cardiology

## 2022-01-22 ENCOUNTER — Other Ambulatory Visit: Payer: Self-pay

## 2022-01-22 DIAGNOSIS — I5020 Unspecified systolic (congestive) heart failure: Secondary | ICD-10-CM

## 2022-01-22 DIAGNOSIS — Z5189 Encounter for other specified aftercare: Secondary | ICD-10-CM | POA: Diagnosis not present

## 2022-01-24 ENCOUNTER — Encounter (HOSPITAL_COMMUNITY)
Admission: RE | Admit: 2022-01-24 | Discharge: 2022-01-24 | Disposition: A | Discharge status: Home / Self Care | Payer: Medicare Other | Source: Ambulatory Visit | Attending: Cardiology | Admitting: Cardiology

## 2022-01-24 ENCOUNTER — Other Ambulatory Visit: Payer: Self-pay

## 2022-01-24 DIAGNOSIS — Z5189 Encounter for other specified aftercare: Secondary | ICD-10-CM | POA: Diagnosis not present

## 2022-01-24 DIAGNOSIS — I5020 Unspecified systolic (congestive) heart failure: Secondary | ICD-10-CM

## 2022-01-26 ENCOUNTER — Encounter (HOSPITAL_COMMUNITY)
Admission: RE | Admit: 2022-01-26 | Discharge: 2022-01-26 | Disposition: A | Discharge status: Home / Self Care | Payer: Medicare Other | Source: Ambulatory Visit | Attending: Cardiology | Admitting: Cardiology

## 2022-01-26 ENCOUNTER — Other Ambulatory Visit: Payer: Self-pay

## 2022-01-26 DIAGNOSIS — I5020 Unspecified systolic (congestive) heart failure: Secondary | ICD-10-CM

## 2022-01-26 DIAGNOSIS — Z5189 Encounter for other specified aftercare: Secondary | ICD-10-CM | POA: Diagnosis not present

## 2022-01-28 NOTE — Progress Notes (Signed)
ADVANCED HF CLINIC CONSULT NOTE  Referring Physician: Dr. Herbie Baltimore Primary Care: Daisy Floro, MD Primary Cardiologist: Dr. Herbie Baltimore HF Cardiologist: Dr. Gala Romney  HPI:  [name] is a 66 year old with a history of CAD, s/pCABG 2020, DM, hyperlipidemia, ADHD previously on adderall at leat 15 years, LBBB and ssytolic HF due to [facility].     Admitted 8/20 with NSTEMI. LHC with severe 2v CAD with totalled LAD. LVEF at that time was 35 to 45%.  He underwent CABG with LIMA to LAD, SVG to PDA, SVG to PL 08/13/2019 with Dr. Cliffton Asters.    In 2021 EF was up to 45-50%.    Admitted with 11/17/21 with ADHF. Cath with severe 2V CAD with CTO of proximal LAD and mild RCA. LIMA patent.  Chronically occluded SVG-rPDA and SVG-rPL. ECHO EF 20-25%    Today he presents to establish with AHF clinic with his wife. He is doing CR 3x/week. Occasional positional dizziness with standing. No SOB with CR, stairs or walking on flat ground.  Denies abnormal bleeding, palpitations, CP, edema, or PND/Orthopnea. Appetite ok. No fever or chills. Weight at home 204 pounds, up 10-15 lbs since hospitalization. Taking all medications. He takes Adderall three times a week. Wife says he snores and he has some daytime fatigue. 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     Cardiac Testing  Echo 11/2021 EF 20-25% RV moderately reduced  Echo 1/20221 EF 45-50%    RHC/LHC 11/20/21 Severe two-vessel coronary artery disease with chronic total occlusions of proximal LAD and mid RCA.  There is also moderate diffuse disease of the distal LCx. Widely patent LIMA-D3, which backfills the mid and distal LAD. Chronically occluded SVG-rPDA and SVG-rPL. Mildly-moderately elevated left heart filling pressure. Normal right heart filling pressure. Mildly reduced Fick cardiac output.  Recommendations: Continue aggressive secondary prevention of coronary artery disease. Gentle diuresis. Escalate goal-directed medical therapy of  HFrEF.  Review of Systems: [y] = yes, [ ]  = no   General: Weight gain [ y]; Weight loss [ ] ; Anorexia [ ] ; Fatigue [y]; Fever [ ] ; Chills [ ] ; Weakness [ ]   Cardiac: Chest pain/pressure [ ] ; Resting SOB [ ] ; Exertional SOB [ ] ; Orthopnea [ ] ; Pedal Edema [ ] ; Palpitations [ ] ; Syncope [ ] ; Presyncope [ ] ; Paroxysmal nocturnal dyspnea[ ]   Pulmonary: Cough [ ] ; Wheezing[ ] ; Hemoptysis[ ] ; Sputum [ ] ; Snoring [place] ]  GI: Vomiting[ ] ; Dysphagia[ ] ; Melena[ ] ; Hematochezia [ ] ; Heartburn[ ] ; Abdominal pain [ ] ; Constipation [ ] ; Diarrhea [ ] ; BRBPR [ ]   GU: Hematuria[ ] ; Dysuria [ ] ; Nocturia[ ]   Vascular: Pain in legs with walking [ ] ; Pain in feet with lying flat [ ] ; Non-healing sores [ ] ; Stroke [ ] ; TIA [ ] ; Slurred speech [ ] ;  Neuro: Headaches[ ] ; Vertigo[ ] ; Seizures[ ] ; Paresthesias[ ] ;Blurred vision [ ] ; Diplopia [ ] ; Vision changes [ ]   Ortho/Skin: Arthritis [ ] ; Joint pain [ ] ; Muscle pain [ ] ; Joint swelling [ ] ; Back Pain [ ] ; Rash [ ]   Psych: Depression[ ] ; Anxiety[ ]   Heme: Bleeding problems [ ] ; Clotting disorders [ ] ; Anemia [ ]   Endocrine: Diabetes [y]; Thyroid dysfunction[ ]   Past Medical History:  Diagnosis Date   CHF (congestive heart failure) (HCC)    Chronic HFrEF (heart failure with reduced ejection fraction) (HCC) 11/17/2021   EF down to 20-25% with Acute Presentation of HFrEF - Global & LAD territory HK.   Coronary artery disease  involving coronary bypass graft of native heart 08/12/2019   SVG-rPDA & SVG-RPL 100% occluded.   HLD (hyperlipidemia)    Hyperlipidemia associated with type 2 diabetes mellitus ([place]) 11/30/2019   Ischemic cardiomyopathy 11/30/2019   Post NSTEMI-CABG, EF increased from 35% to 45% => 11/2021 Acute on Chronic HFrEF -> EF downt to 20-25%. Cath with both SVGs to RPDA & PL along with native RCA CTO.  Patent LIMA-D3(LAD).  L-RPDA collaterals.  = titration of meds limited by Hypotension.   Multivessel CAD - 100% LAD (with Diag), 99% mRCA (subtotal  occlusio) 08/12/2019   NSTEMI (non-ST elevated myocardial infarction) ([place]) 08/10/2019   S/P Off Pump CABG x 3 08/13/2019   LIMA to LAD RSVG to PDA RSVG to PLVB   Current Outpatient Medications  Medication Sig Dispense Refill   acetaminophen (TYLENOL) 500 MG tablet Take 500-1,000 mg by mouth every 8 (eight) hours as needed for mild pain.     amphetamine-dextroamphetamine (ADDERALL) 20 MG tablet Take 10 mg by mouth 4 (four) times daily as needed (focus).     aspirin EC 81 MG tablet Take 81 mg by mouth daily.     B Complex Vitamins (B-COMPLEX/B-12 PO) Take 500 mg by mouth daily.     carvedilol (COREG) 6.25 MG tablet Take 1 tablet (6.25 mg total) by mouth 2 (two) times daily with a meal. 60 tablet 5   Cholecalciferol (VITAMIN D3) 50 MCG (2000 UT) capsule Take 2,000 Units by mouth daily.     dapagliflozin propanediol (FARXIGA) 10 MG TABS tablet Take 1 tablet (10 mg total) by mouth daily. 30 tablet 5   Evolocumab (REPATHA SURECLICK) XX123456 MG/ML SOAJ Inject 140 mg into the skin every 14 (fourteen) days. 6 mL 3   furosemide (LASIX) 40 MG tablet TAKE 1 TABLET (40 MG TOTAL) BY MOUTH AS NEEDED. 30 tablet 5   glucose blood test strip TEST FOUR TIMES DAILY OR AS DIRECTED 100 each 1   Lancets (ONETOUCH ULTRASOFT) lancets Use as instructed 100 each 1   MAGNESIUM PO Take 1 tablet by mouth daily.     Melatonin 10 MG CAPS Take 10 mg by mouth at bedtime as needed (sleep).     Menaquinone-7 (K2 PO) Take 100 mg by mouth daily.     Multiple Vitamin (MULTI-VITAMIN) tablet Take 1 tablet by mouth daily.     NIACIN PO Take 700 mg by mouth at bedtime.     Potassium 99 MG TABS Take 1 tablet by mouth daily in the afternoon.     sacubitril-valsartan (ENTRESTO) 24-26 MG Take 1 tablet by mouth 2 (two) times daily. 60 tablet 5   sildenafil (VIAGRA) 50 MG tablet Take 50 mg by mouth daily as needed for erectile dysfunction.     terbinafine (LAMISIL) 250 MG tablet Take 250 mg by mouth See admin instructions. 250 mg oral daily  for 7 days every month, last week of the month     No current facility-administered medications for this encounter.   Allergies  Allergen Reactions   Atorvastatin Other (See Comments)    Memory deficit, fatigue, myalgias, insomnia; similar symptoms with rosuvastatin.   Icosapent Ethyl     lethargy   Rosuvastatin Other (See Comments)    Lethargic, fatigue   Social History   Socioeconomic History   Marital status: Married    Spouse name: Not on file   Number of children: Not on file   Years of education: Not on file   Highest education level: Not on file  Occupational History   Not on file  Tobacco Use   Smoking status: Former    Types: Cigars   Smokeless tobacco: Never  Vaping Use   Vaping Use: Never used  Substance and Sexual Activity   Alcohol use: Not Currently    Comment: not in the past 6 months   Drug use: Not Currently   Sexual activity: Not on file  Other Topics Concern   Not on file  Social History Narrative   Not on file   Social Determinants of Health   Financial Resource Strain: Low Risk    Difficulty of Paying Living Expenses: Not very hard  Food Insecurity: No Food Insecurity   Worried About Running Out of Food in the Last Year: Never true   Ran Out of Food in the Last Year: Never true  Transportation Needs: No Transportation Needs   Lack of Transportation (Medical): No   Lack of Transportation (Non-Medical): No  Physical Activity: Not on file  Stress: Not on file  Social Connections: Not on file  Intimate Partner Violence: Not on file    History reviewed. No pertinent family history.  BP 98/60    Pulse 71    Wt 94.7 kg (208 lb 12.8 oz)    SpO2 97%    BMI 29.96 kg/m   Wt Readings from Last 3 Encounters:  01/29/22 94.7 kg (208 lb 12.8 oz)  01/02/22 94 kg (207 lb 3.7 oz)  12/26/21 94.3 kg (207 lb 12.8 oz)   PHYSICAL EXAM: General:  NAD. No resp difficulty HEENT: Normal Neck: Supple. JVP 7-8. Carotids 2+ bilat; no bruits. No lymphadenopathy  or thryomegaly appreciated. Cor: PMI nondisplaced. Regular rate & rhythm. No rubs, gallops or murmurs. Lungs: Clear Abdomen: Soft, nontender, nondistended. No hepatosplenomegaly. No bruits or masses. Good bowel sounds. Extremities: No cyanosis, clubbing, rash, 1-2+ BLEedema Neuro: Alert & oriented x 3, cranial nerves grossly intact. Moves all 4 extremities w/o difficulty. Affect pleasant.  ECG: SR LBBB qrs 174 msec (personally reviewed).  ASSESSMENT & PLAN: 1. Chronic HFrEF due to iCM +/- LBBB - Echo 2021 EF 45-50%.  - Echo 12/22 EF 20-25% - Wide LBBB on ECG. - NYHA II, volume up today. - Stop Lasix - Start torsemide 40 mg daily + 20 KCL. - Continue carvedilol 6.25 mg bid --> He prefers to not increase due to ED.  - Continue Entresto 24-26 mg bid. - Does not want spiro or eplerenone due to gynecomastia. - Continue Farxiga 10 mg daily.  2. LBBB - Suspect LBBB CM. - QRS 174 ms. On ECG today. - Refer to EP for CRT-D.   3.CAD - S/P CABG 2021 - LHC 11/2021 severe 2V CAD with CTO of proximal LAD and mild RCA . LIMA patent. + chronically occluded SVG-rPDA and SVG-rPL. - Intolerant statin. On Repatha.  - Continue ASA+ beta blocker.  - No s/s angina. - Continue CR   4. DMII - Continue Farxiga - Consider [id number].   5. ADHD - OK to continue Adderall, he only uses ~ 3 x week and is not tachycardic  6. Snoring - Sleep study down the road.  Glori Bickers, MD  5:54 PM

## 2022-01-29 ENCOUNTER — Ambulatory Visit (HOSPITAL_COMMUNITY)
Admission: RE | Admit: 2022-01-29 | Discharge: 2022-01-29 | Disposition: A | Discharge status: Home / Self Care | Payer: Medicare Other | Source: Ambulatory Visit | Attending: Internal Medicine | Admitting: Internal Medicine

## 2022-01-29 ENCOUNTER — Encounter (HOSPITAL_COMMUNITY): Payer: Self-pay | Admitting: Internal Medicine

## 2022-01-29 ENCOUNTER — Other Ambulatory Visit: Payer: Self-pay

## 2022-01-29 VITALS — BP 98/60 | HR 71 | Wt 208.8 lb

## 2022-01-29 DIAGNOSIS — Z79899 Other long term (current) drug therapy: Secondary | ICD-10-CM | POA: Diagnosis not present

## 2022-01-29 DIAGNOSIS — I252 Old myocardial infarction: Secondary | ICD-10-CM | POA: Diagnosis not present

## 2022-01-29 DIAGNOSIS — R0683 Snoring: Secondary | ICD-10-CM | POA: Diagnosis not present

## 2022-01-29 DIAGNOSIS — I25708 Atherosclerosis of coronary artery bypass graft(s), unspecified, with other forms of angina pectoris: Secondary | ICD-10-CM

## 2022-01-29 DIAGNOSIS — E785 Hyperlipidemia, unspecified: Secondary | ICD-10-CM | POA: Insufficient documentation

## 2022-01-29 DIAGNOSIS — I2510 Atherosclerotic heart disease of native coronary artery without angina pectoris: Secondary | ICD-10-CM | POA: Diagnosis not present

## 2022-01-29 DIAGNOSIS — E119 Type 2 diabetes mellitus without complications: Secondary | ICD-10-CM | POA: Insufficient documentation

## 2022-01-29 DIAGNOSIS — I447 Left bundle-branch block, unspecified: Secondary | ICD-10-CM | POA: Diagnosis not present

## 2022-01-29 DIAGNOSIS — F909 Attention-deficit hyperactivity disorder, unspecified type: Secondary | ICD-10-CM | POA: Diagnosis not present

## 2022-01-29 DIAGNOSIS — I5022 Chronic systolic (congestive) heart failure: Secondary | ICD-10-CM | POA: Diagnosis present

## 2022-01-29 LAB — BRAIN NATRIURETIC PEPTIDE: B Natriuretic Peptide: 390 pg/mL — ABNORMAL HIGH (ref 0.0–100.0)

## 2022-01-29 LAB — BASIC METABOLIC PANEL
Anion gap: 8 (ref 5–15)
BUN: 18 mg/dL (ref 8–23)
CO2: 25 mmol/L (ref 22–32)
Calcium: 9.6 mg/dL (ref 8.9–10.3)
Chloride: 106 mmol/L (ref 98–111)
Creatinine, Ser: 1.05 mg/dL (ref 0.61–1.24)
GFR, Estimated: >60 mL/min (ref >60)
Glucose, Bld: 100 mg/dL — ABNORMAL HIGH (ref 70–99)
Potassium: 4.5 mmol/L (ref 3.5–5.1)
Sodium: 139 mmol/L (ref 135–145)

## 2022-01-29 LAB — CBC
HCT: 48.8 % (ref 39.0–52.0)
Hemoglobin: 16.8 g/dL (ref 13.0–17.0)
MCH: 30.9 pg (ref 26.0–34.0)
MCHC: 34.4 g/dL (ref 30.0–36.0)
MCV: 89.7 fL (ref 80.0–100.0)
Platelets: 201 10*3/uL (ref 150–400)
RBC: 5.44 MIL/uL (ref 4.22–5.81)
RDW: 13.3 % (ref 11.5–15.5)
WBC: 7.5 10*3/uL (ref 4.0–10.5)
nRBC: 0 % (ref 0.0–0.2)

## 2022-01-29 MED ORDER — TORSEMIDE 20 MG PO TABS: 40.0000 mg | ORAL_TABLET | Freq: Every day | ORAL | 3 refills | Status: DC

## 2022-01-29 NOTE — Patient Instructions (Signed)
Medication Changes:  Stop Lasix  Start Torsemide 40mg  Daily  Lab Work:  Labs done today, your results will be available in MyChart, we will contact you for abnormal readings.   Testing/Procedures:  Your physician has requested that you have a cardiac MRI. Cardiac MRI uses a computer to create images of your heart as its beating, producing both still and moving pictures of your heart and major blood vessels. For further information please visit [email or web address] Please follow the instruction sheet given to you today for more information. ONCE APPROVED BY INSURANCE YOU WILL BE CALLED TO ARRANGE YOUR APPOINTMENT   Referrals:  You have been referred to Electrophysiologist for a CRT-D    Special Instructions // Education:  Make sure you take your potassium every day   Follow-Up in: 3 months  At the [facility], you and your health needs are our priority. We have a designated team specialized in the treatment of Heart Failure. This Care Team includes your primary Heart Failure Specialized Cardiologist (physician), Advanced Practice Providers (APPs- Physician Assistants and Nurse Practitioners), and Pharmacist who all work together to provide you with the care you need, when you need it.   You may see any of the following providers on your designated Care Team at your next follow up:  Dr Glori Bickers Dr Haynes Kerns, NP Lyda Jester, [place] [facility] [place], [place] Audry Riles, PharmD   Please be sure to bring in all your medications bottles to every appointment.   Need to Contact us:  If you have any questions or concerns before your next appointment please send [place] a message through [place] or call our office at [phone].    TO LEAVE A MESSAGE FOR THE NURSE SELECT OPTION 2, PLEASE LEAVE A MESSAGE INCLUDING: YOUR NAME DATE OF BIRTH CALL BACK NUMBER REASON FOR CALL**this is important as we prioritize the call backs  YOU  WILL RECEIVE A CALL BACK THE SAME DAY AS LONG AS YOU CALL BEFORE 4:00 PM

## 2022-01-31 ENCOUNTER — Encounter (HOSPITAL_COMMUNITY)
Admission: RE | Admit: 2022-01-31 | Discharge: 2022-01-31 | Disposition: A | Discharge status: Home / Self Care | Payer: Medicare Other | Source: Ambulatory Visit | Attending: Cardiology | Admitting: Cardiology

## 2022-01-31 ENCOUNTER — Other Ambulatory Visit: Payer: Self-pay

## 2022-01-31 DIAGNOSIS — Z5189 Encounter for other specified aftercare: Secondary | ICD-10-CM | POA: Diagnosis not present

## 2022-01-31 DIAGNOSIS — I5020 Unspecified systolic (congestive) heart failure: Secondary | ICD-10-CM

## 2022-01-31 NOTE — Progress Notes (Signed)
Reviewed home exercise guidelines with patient including endpoints, temperature precautions, target heart rate and rate of perceived exertion. Patient plans to walk as his mode of home exercise. Patient voices understanding of instructions given.  Faven Watterson M Dangelo Guzzetta, MS, ACSM CEP  

## 2022-01-31 NOTE — Progress Notes (Signed)
Cardiac Individual Treatment Plan  Patient Details  Name: Anthony Alvarez MRN: [id number] Date of Birth: March 20, 1956 Referring Provider:   Flowsheet Row CARDIAC REHAB PHASE II ORIENTATION from 01/02/2022 in [place]  Referring Provider [name], MD       Initial Encounter Date:  [place] from 01/02/2022 in [place]  Date 01/02/22       Visit Diagnosis: HFrEF (heart failure with reduced ejection fraction) ([place])  Patient's Home Medications on Admission:  Current Outpatient Medications:    acetaminophen (TYLENOL) 500 MG tablet, Take 500-1,000 mg by mouth every 8 (eight) hours as needed for mild pain., Disp: , Rfl:    amphetamine-dextroamphetamine (ADDERALL) 20 MG tablet, Take 10 mg by mouth 4 (four) times daily as needed (focus)., Disp: , Rfl:    aspirin EC 81 MG tablet, Take 81 mg by mouth daily., Disp: , Rfl:    B Complex Vitamins (B-COMPLEX/B-12 PO), Take 500 mg by mouth daily., Disp: , Rfl:    carvedilol (COREG) 6.25 MG tablet, Take 1 tablet (6.25 mg total) by mouth 2 (two) times daily with a meal., Disp: 60 tablet, Rfl: 5   Cholecalciferol (VITAMIN D3) 50 MCG (2000 UT) capsule, Take 2,000 Units by mouth daily., Disp: , Rfl:    dapagliflozin propanediol (FARXIGA) 10 MG TABS tablet, Take 1 tablet (10 mg total) by mouth daily., Disp: 30 tablet, Rfl: 5   Evolocumab (REPATHA SURECLICK) [id number] MG/ML SOAJ, Inject 140 mg into the skin every 14 (fourteen) days., Disp: 6 mL, Rfl: 3   glucose blood test strip, TEST FOUR TIMES DAILY OR AS DIRECTED, Disp: 100 each, Rfl: 1   Lancets (ONETOUCH ULTRASOFT) lancets, Use as instructed, Disp: 100 each, Rfl: 1   MAGNESIUM PO, Take 1 tablet by mouth daily., Disp: , Rfl:    Melatonin 10 MG CAPS, Take 10 mg by mouth at bedtime as needed (sleep)., Disp: , Rfl:    Menaquinone-7 (K2 PO), Take 100 mg by mouth daily., Disp: , Rfl:    Multiple Vitamin  (MULTI-VITAMIN) tablet, Take 1 tablet by mouth daily., Disp: , Rfl:    NIACIN PO, Take 700 mg by mouth at bedtime., Disp: , Rfl:    Potassium 99 MG TABS, Take 1 tablet by mouth daily in the afternoon., Disp: , Rfl:    sacubitril-valsartan (ENTRESTO) 24-26 MG, Take 1 tablet by mouth 2 (two) times daily., Disp: 60 tablet, Rfl: 5   sildenafil (VIAGRA) 50 MG tablet, Take 50 mg by mouth daily as needed for erectile dysfunction., Disp: , Rfl:    terbinafine (LAMISIL) 250 MG tablet, Take 250 mg by mouth See admin instructions. 250 mg oral daily for 7 days every month, last week of the month, Disp: , Rfl:    torsemide (DEMADEX) 20 MG tablet, Take 2 tablets (40 mg total) by mouth daily., Disp: 180 tablet, Rfl: 3  Past Medical History: Past Medical History:  Diagnosis Date   CHF (congestive heart failure) (HCC)    Chronic HFrEF (heart failure with reduced ejection fraction) ([place]) 11/17/2021   EF down to 20-25% with Acute Presentation of HFrEF - Global & LAD territory HK.   Coronary artery disease involving coronary bypass graft of native heart 08/12/2019   SVG-rPDA & SVG-RPL 100% occluded.   HLD (hyperlipidemia)    Hyperlipidemia associated with type 2 diabetes mellitus ([place]) 11/30/2019   Ischemic cardiomyopathy 11/30/2019   Post NSTEMI-CABG, EF increased from 35% to  45% => 11/2021 Acute on Chronic HFrEF -> EF downt to 20-25%. Cath with both SVGs to RPDA & PL along with native RCA CTO.  Patent LIMA-D3(LAD).  L-RPDA collaterals.  = titration of meds limited by Hypotension.   Multivessel CAD - 100% LAD (with Diag), 99% mRCA (subtotal occlusio) 08/12/2019   NSTEMI (non-ST elevated myocardial infarction) (HCC) 08/10/2019   S/P Off Pump CABG x 3 08/13/2019   LIMA to LAD RSVG to PDA RSVG to PLVB    Tobacco Use: Social History   Tobacco Use  Smoking Status Former   Types: Cigars  Smokeless Tobacco Never    Labs: Recent Review Flowsheet Data     Labs for ITP Cardiac and Pulmonary Rehab Latest Ref  Rng & Units 12/21/2019 08/24/2021 11/19/2021 11/20/2021 11/20/2021   Cholestrol 0 - 200 mg/dL 16(F) 790(X) 75 - -   LDLCALC 0 - 99 mg/dL 27 833(X) 18 - -   HDL >40 mg/dL 83(A) 91(B) 16(O) - -   Trlycerides <150 mg/dL 060(O) 459(X) 774 - -   Hemoglobin A1c 4.8 - 5.6 % - - - - -   PHART 7.350 - 7.450 - - - - 7.420   PCO2ART 32.0 - 48.0 mmHg - - - - 35.0   HCO3 20.0 - 28.0 mmol/L - - - 24.3 22.7   TCO2 22 - 32 mmol/L - - - 26 24   ACIDBASEDEF 0.0 - 2.0 mmol/L - - - 1.0 1.0   O2SAT % - - - 66.0 94.0       Capillary Blood Glucose: Lab Results  Component Value Date   GLUCAP 121 (H) 01/10/2022   GLUCAP 103 (H) 01/08/2022   GLUCAP 118 (H) 01/08/2022   GLUCAP 145 (H) 08/17/2019   GLUCAP 143 (H) 08/16/2019     Exercise Target Goals: Exercise Program Goal: Individual exercise prescription set using results from initial 6 min walk test and THRR while considering  patients activity barriers and safety.   Exercise Prescription Goal: Initial exercise prescription builds to 30-45 minutes a day of aerobic activity, 2-3 days per week.  Home exercise guidelines will be given to patient during program as part of exercise prescription that the participant will acknowledge.  Activity Barriers & Risk Stratification:  Activity Barriers & Cardiac Risk Stratification - 01/02/22 0919       Activity Barriers & Cardiac Risk Stratification   Activity Barriers Assistive Device;History of Falls    Cardiac Risk Stratification High             6 Minute Walk:  6 Minute Walk     Row Name 01/02/22 0930         6 Minute Walk   Phase Initial     Distance 1645 feet     Walk Time 6 minutes     # of Rest Breaks 0     MPH 3.11     METS 3.64     RPE 11     Perceived Dyspnea  1     VO2 Peak 12.75     Symptoms Yes (comment)     Comments Mild SOB     Resting HR 84 bpm     Resting BP 102/64     Resting Oxygen Saturation  96 %     Exercise Oxygen Saturation  during 6 min walk 98 %     Max Ex. HR 119  bpm     Max Ex. BP 118/72     2 Minute Post BP  120/70              Oxygen Initial Assessment:   Oxygen Re-Evaluation:   Oxygen Discharge (Final Oxygen Re-Evaluation):   Initial Exercise Prescription:  Initial Exercise Prescription - 01/02/22 0900       Date of Initial Exercise RX and Referring Provider   Date 01/02/22    Referring Provider [name], MD    Expected Discharge Date 03/02/22      Bike   Level 1.3    Minutes 15    METs 3.61      NuStep   Level 3    SPM 85    Minutes 15    METs 2.8      Prescription Details   Frequency (times per week) 3    Duration Progress to 30 minutes of continuous aerobic without signs/symptoms of physical distress      Intensity   THRR 40-80% of Max Heartrate 61-122    Ratings of Perceived Exertion 11-13    Perceived Dyspnea 0-4      Progression   Progression Continue to progress workloads to maintain intensity without signs/symptoms of physical distress.      Resistance Training   Training Prescription Yes    Weight 4 lbs    Reps 10-15             Perform Capillary Blood Glucose checks as needed.  Exercise Prescription Changes:   Exercise Prescription Changes     Row Name 01/08/22 1021 01/15/22 1025 01/31/22 1024         Response to Exercise   Blood Pressure (Admit) 98/60 108/68 106/60     Blood Pressure (Exercise) 118/70 108/70 108/64     Blood Pressure (Exit) 97/68 112/70 100/54     Heart Rate (Admit) 81 bpm 71 bpm 69 bpm     Heart Rate (Exercise) 114 bpm 100 bpm 114 bpm     Heart Rate (Exit) 91 bpm 62 bpm 79 bpm     Oxygen Saturation (Exercise) 99 % -- --     Rating of Perceived Exertion (Exercise) 13 11 11      Perceived Dyspnea (Exercise) 1.5 0 0     Symptoms Mild SOB, lightheaded None None     Comments Patient tolerated low intensity exercise fair, c/o SOB, lightheaded on stationary bike. -- --     Duration Progress to 30 minutes of  aerobic without signs/symptoms of physical distress  Continue with 30 min of aerobic exercise without signs/symptoms of physical distress. Continue with 30 min of aerobic exercise without signs/symptoms of physical distress.     Intensity THRR unchanged THRR unchanged THRR unchanged       Progression   Progression Continue to progress workloads to maintain intensity without signs/symptoms of physical distress. Continue to progress workloads to maintain intensity without signs/symptoms of physical distress. Continue to progress workloads to maintain intensity without signs/symptoms of physical distress.     Average METs 2.8 2.7 3       Resistance Training   Training Prescription Yes Yes No  Relaxation day, no weights     Weight 4 lbs 5 lbs --     Reps 10-15 10-15 --     Time 10 Minutes 10 Minutes --       Interval Training   Interval Training No No No       Bike   Level 1.3 -- --     Minutes 12  Stopped early due to lightheadedness and SOB. -- --  METs 3.58 -- --       Recumbant Bike   Level -- 2 2.5     Minutes -- 15 15     METs -- 3 3.3       NuStep   Level 3 3 4      SPM 85 85 85     Minutes 15 15 15      METs 2 2.4 2.8       Home Exercise Plan   Plans to continue exercise at -- -- Home (comment)  Walking     Frequency -- -- Add 2 additional days to program exercise sessions.     Initial Home Exercises Provided -- -- 01/31/22              Exercise Comments:   Exercise Comments     Row Name 01/08/22 1135 01/15/22 1101 01/31/22 1056       Exercise Comments Patient tolerated low intensity exercise fair, c/o mild SOB and lightheaded on the stationary bike. Reviewed METs and goals with patient. Reviewed home exercise guidelines, METs, and goals with patient.              Exercise Goals and Review:   Exercise Goals     Row Name 01/02/22 0848             Exercise Goals   Increase Physical Activity Yes       Intervention Provide advice, education, support and counseling about physical activity/exercise  needs.;Develop an individualized exercise prescription for aerobic and resistive training based on initial evaluation findings, risk stratification, comorbidities and participant's personal goals.       Expected Outcomes Short Term: Attend rehab on a regular basis to increase amount of physical activity.;Long Term: Exercising regularly at least 3-5 days a week.;Long Term: Add in home exercise to make exercise part of routine and to increase amount of physical activity.       Increase Strength and Stamina Yes       Intervention Provide advice, education, support and counseling about physical activity/exercise needs.;Develop an individualized exercise prescription for aerobic and resistive training based on initial evaluation findings, risk stratification, comorbidities and participant's personal goals.       Expected Outcomes Short Term: Increase workloads from initial exercise prescription for resistance, speed, and METs.;Short Term: Perform resistance training exercises routinely during rehab and add in resistance training at home;Long Term: Improve cardiorespiratory fitness, muscular endurance and strength as measured by increased METs and functional capacity (6MWT)       Able to understand and use rate of perceived exertion (RPE) scale Yes       Intervention Provide education and explanation on how to use RPE scale       Expected Outcomes Short Term: Able to use RPE daily in rehab to express subjective intensity level;Long Term:  Able to use RPE to guide intensity level when exercising independently       Knowledge and understanding of Target Heart Rate Range (THRR) Yes       Intervention Provide education and explanation of THRR including how the numbers were predicted and where they are located for reference       Expected Outcomes Long Term: Able to use THRR to govern intensity when exercising independently;Short Term: Able to state/look up THRR;Short Term: Able to use daily as guideline for  intensity in rehab       Able to check pulse independently Yes       Intervention Provide education and demonstration  on how to check pulse in carotid and radial arteries.;Review the importance of being able to check your own pulse for safety during independent exercise       Expected Outcomes Short Term: Able to explain why pulse checking is important during independent exercise;Long Term: Able to check pulse independently and accurately       Understanding of Exercise Prescription Yes       Intervention Provide education, explanation, and written materials on patient's individual exercise prescription       Expected Outcomes Short Term: Able to explain program exercise prescription;Long Term: Able to explain home exercise prescription to exercise independently                Exercise Goals Re-Evaluation :  Exercise Goals Re-Evaluation     [place] Name 01/08/22 1135 01/15/22 1101 01/31/22 1056         Exercise Goal Re-Evaluation   Exercise Goals Review Increase Physical Activity;Able to understand and use rate of perceived exertion (RPE) scale Increase Physical Activity;Able to understand and use rate of perceived exertion (RPE) scale;Increase Strength and Stamina Increase Physical Activity;Able to understand and use rate of perceived exertion (RPE) scale;Increase Strength and Stamina;Able to check pulse independently;Knowledge and understanding of Target Heart Rate Range (THRR);Understanding of Exercise Prescription     Comments Patient able to understand and use RPE scale appropriately. Patient is making good progress with exercise. Patient's goal is to get stronger. Increased hand weights today from 4 to 5 lbs and tolerated well. Reviewed exercise prescription with patient. Patient plans to walk as his mode of home exericse. Patient has 10lb weights at home. Will progress hand weights here, but encouraged patient to use cans or bottles for his resistance or to do les repititions with the  heavier weights. Patient has a smart watch to check his pulse.     Expected Outcomes Progress workloads as tolerated to help improve cardiorespiratory fitness. Continue current exercise routine. Progress workloads/hand weights as tolerated to help increase strength. Patient will add walking 30 minutes, 2 days/week to his exercise routine.              Discharge Exercise Prescription (Final Exercise Prescription Changes):  Exercise Prescription Changes - 01/31/22 1024       Response to Exercise   Blood Pressure (Admit) 106/60    Blood Pressure (Exercise) 108/64    Blood Pressure (Exit) 100/54    Heart Rate (Admit) 69 bpm    Heart Rate (Exercise) 114 bpm    Heart Rate (Exit) 79 bpm    Rating of Perceived Exertion (Exercise) 11    Perceived Dyspnea (Exercise) 0    Symptoms None    Duration Continue with 30 min of aerobic exercise without signs/symptoms of physical distress.    Intensity THRR unchanged      Progression   Progression Continue to progress workloads to maintain intensity without signs/symptoms of physical distress.    Average METs 3      Resistance Training   Training Prescription No   Relaxation day, no weights     Interval Training   Interval Training No      Recumbant Bike   Level 2.5    Minutes 15    METs 3.3      NuStep   Level 4    SPM 85    Minutes 15    METs 2.8      Home Exercise Plan   Plans to continue exercise at Home (comment)   Walking  Frequency Add 2 additional days to program exercise sessions.    Initial Home Exercises Provided 01/31/22             Nutrition:  Target Goals: Understanding of nutrition guidelines, daily intake of sodium 1500mg , cholesterol 200mg , calories 30% from fat and 7% or less from saturated fats, daily to have 5 or more servings of fruits and vegetables.  Biometrics:  Pre Biometrics - 01/02/22 0847       Pre Biometrics   Waist Circumference 39 inches    Hip Circumference 41 inches    Waist to Hip  Ratio 0.95 %    Triceps Skinfold 24 mm    % Body Fat 29.5 %    Grip Strength 46 kg    Flexibility 9 in    Single Leg Stand 30 seconds              Nutrition Therapy Plan and Nutrition Goals:   Nutrition Assessments:  MEDIFICTS Score Key: ?70 Need to make dietary changes  40-70 Heart Healthy Diet ? 40 Therapeutic Level Cholesterol Diet    Picture Your Plate Scores: D34-534 Unhealthy dietary pattern with much room for improvement. 41-50 Dietary pattern unlikely to meet recommendations for good health and room for improvement. 51-60 More healthful dietary pattern, with some room for improvement.  >60 Healthy dietary pattern, although there may be some specific behaviors that could be improved.    Nutrition Goals Re-Evaluation:   Nutrition Goals Re-Evaluation:   Nutrition Goals Discharge (Final Nutrition Goals Re-Evaluation):   Psychosocial: Target Goals: Acknowledge presence or absence of significant depression and/or stress, maximize coping skills, provide positive support system. Participant is able to verbalize types and ability to use techniques and skills needed for reducing stress and depression.  Initial Review & Psychosocial Screening:  Initial Psych Review & Screening - 01/02/22 [id number]       Initial Review   Current issues with Current Stress Concerns;History of Depression    Source of Stress Concerns Chronic Illness    Comments [name] is concerned about his health due to his heart issues.      Family Dynamics   Good Support System? Yes   [name] has his wife for support     Barriers   Psychosocial barriers to participate in program The patient should benefit from training in stress management and relaxation.      Screening Interventions   Interventions Encouraged to exercise;Provide feedback about the scores to participant;To provide support and resources with identified psychosocial needs    Expected Outcomes Long Term Goal: Stressors or current issues are  controlled or eliminated.;Short Term goal: Identification and review with participant of any Quality of Life or Depression concerns found by scoring the questionnaire.;Long Term goal: The participant improves quality of Life and PHQ9 Scores as seen by post scores and/or verbalization of changes             Quality of Life Scores:  Quality of Life - 01/02/22 1022       Quality of Life   Select Quality of Life      Quality of Life Scores   Health/Function Pre 17.47 %    Socioeconomic Pre 25.43 %    Psych/Spiritual Pre 25.79 %    Family Pre 24 %    GLOBAL Pre 21.78 %            Scores of 19 and below usually indicate a poorer quality of life in these areas.  A difference of  2-3  points is a clinically meaningful difference.  A difference of 2-3 points in the total score of the Quality of Life Index has been associated with significant improvement in overall quality of life, self-image, physical symptoms, and general health in studies assessing change in quality of life.  PHQ-9: Recent Review Flowsheet Data     Depression screen [facility] 2/9 01/02/2022   Decreased Interest 0   Down, Depressed, Hopeless 0   PHQ - 2 Score 0      Interpretation of Total Score  Total Score Depression Severity:  1-4 = Minimal depression, 5-9 = Mild depression, 10-14 = Moderate depression, 15-19 = Moderately severe depression, 20-27 = Severe depression   Psychosocial Evaluation and Intervention:   Psychosocial Re-Evaluation:  Psychosocial Re-Evaluation     [place] Name 01/09/22 0801 01/30/22 1610           Psychosocial Re-Evaluation   Current issues with Current Stress Concerns;History of Depression Current Stress Concerns;History of Depression      Comments Reviewed Bill's quality of life. [name] has stress concerns due to his heart health. [name] has not voiced any increased stressors since participating in phase 2 cardiac rehab.      Expected Outcomes Bill's depression will be controlled or  decreased upon completion of phase 2 cardiac rehab Bill's depression will be controlled or decreased upon completion of phase 2 cardiac rehab      Interventions Encouraged to attend Cardiac Rehabilitation for the exercise;Stress management education;Relaxation education Encouraged to attend Cardiac Rehabilitation for the exercise;Stress management education;Relaxation education      Continue Psychosocial Services  Follow up required by staff Follow up required by staff        Initial Review   Source of Stress Concerns Chronic Illness;Unable to participate in former interests or hobbies;Unable to perform yard/household activities Chronic Illness;Unable to participate in former interests or hobbies;Unable to perform yard/household activities      Comments Will contibue to monitor and offer support as needed Will contibue to monitor and offer support as needed               Psychosocial Discharge (Final Psychosocial Re-Evaluation):  Psychosocial Re-Evaluation - 01/30/22 1610       Psychosocial Re-Evaluation   Current issues with Current Stress Concerns;History of Depression    Comments [name] has not voiced any increased stressors since participating in phase 2 cardiac rehab.    Expected Outcomes Bill's depression will be controlled or decreased upon completion of phase 2 cardiac rehab    Interventions Encouraged to attend Cardiac Rehabilitation for the exercise;Stress management education;Relaxation education    Continue Psychosocial Services  Follow up required by staff      Initial Review   Source of Stress Concerns Chronic Illness;Unable to participate in former interests or hobbies;Unable to perform yard/household activities    Comments Will contibue to monitor and offer support as needed             Vocational Rehabilitation: Provide vocational rehab assistance to qualifying candidates.   Vocational Rehab Evaluation & Intervention:  Vocational Rehab - 01/02/22 0924        Initial Vocational Rehab Evaluation & Intervention   Assessment shows need for Vocational Rehabilitation No   [name] is retired and does not need vocaitonal rehab at this time            Education: Education Goals: Education classes will be provided on a weekly basis, covering required topics. Participant will state understanding/return demonstration of topics presented.  Learning  Barriers/Preferences:  Learning Barriers/Preferences - 01/02/22 1216       Learning Barriers/Preferences   Learning Barriers Exercise Concerns   Bill sometimes has dizziness due to his medications   Learning Preferences Skilled Demonstration             Education Topics: Count Your Pulse:  -Group instruction provided by verbal instruction, demonstration, patient participation and written materials to support subject.  Instructors address importance of being able to find your pulse and how to count your pulse when at home without a heart monitor.  Patients get hands on experience counting their pulse with staff help and individually.   Heart Attack, Angina, and Risk Factor Modification:  -Group instruction provided by verbal instruction, video, and written materials to support subject.  Instructors address signs and symptoms of angina and heart attacks.    Also discuss risk factors for heart disease and how to make changes to improve heart health risk factors.   Functional Fitness:  -Group instruction provided by verbal instruction, demonstration, patient participation, and written materials to support subject.  Instructors address safety measures for doing things around the house.  Discuss how to get up and down off the floor, how to pick things up properly, how to safely get out of a chair without assistance, and balance training.   Meditation and Mindfulness:  -Group instruction provided by verbal instruction, patient participation, and written materials to support subject.  Instructor addresses  importance of mindfulness and meditation practice to help reduce stress and improve awareness.  Instructor also leads participants through a meditation exercise.    Stretching for Flexibility and Mobility:  -Group instruction provided by verbal instruction, patient participation, and written materials to support subject.  Instructors lead participants through series of stretches that are designed to increase flexibility thus improving mobility.  These stretches are additional exercise for major muscle groups that are typically performed during regular warm up and cool down.   Hands Only CPR:  -Group verbal, video, and participation provides a basic overview of AHA guidelines for community CPR. Role-play of emergencies allow participants the opportunity to practice calling for help and chest compression technique with discussion of AED use.   Hypertension: -Group verbal and written instruction that provides a basic overview of hypertension including the most recent diagnostic guidelines, risk factor reduction with self-care instructions and medication management.    Nutrition I class: Heart Healthy Eating:  -Group instruction provided by PowerPoint slides, verbal discussion, and written materials to support subject matter. The instructor gives an explanation and review of the Therapeutic Lifestyle Changes diet recommendations, which includes a discussion on lipid goals, dietary fat, sodium, fiber, plant stanol/sterol esters, sugar, and the components of a well-balanced, healthy diet.   Nutrition II class: Lifestyle Skills:  -Group instruction provided by PowerPoint slides, verbal discussion, and written materials to support subject matter. The instructor gives an explanation and review of label reading, grocery shopping for heart health, heart healthy recipe modifications, and ways to make healthier choices when eating out.   Diabetes Question & Answer:  -Group instruction provided by  PowerPoint slides, verbal discussion, and written materials to support subject matter. The instructor gives an explanation and review of diabetes co-morbidities, pre- and post-prandial blood glucose goals, pre-exercise blood glucose goals, signs, symptoms, and treatment of hypoglycemia and hyperglycemia, and foot care basics.   Diabetes Blitz:  -Group instruction provided by PowerPoint slides, verbal discussion, and written materials to support subject matter. The instructor gives an explanation and review of the  physiology behind type 1 and type 2 diabetes, diabetes medications and rational behind using different medications, pre- and post-prandial blood glucose recommendations and Hemoglobin A1c goals, diabetes diet, and exercise including blood glucose guidelines for exercising safely.    Portion Distortion:  -Group instruction provided by PowerPoint slides, verbal discussion, written materials, and food models to support subject matter. The instructor gives an explanation of serving size versus portion size, changes in portions sizes over the last 20 years, and what consists of a serving from each food group.   Stress Management:  -Group instruction provided by verbal instruction, video, and written materials to support subject matter.  Instructors review role of stress in heart disease and how to cope with stress positively.     Exercising on Your Own:  -Group instruction provided by verbal instruction, power point, and written materials to support subject.  Instructors discuss benefits of exercise, components of exercise, frequency and intensity of exercise, and end points for exercise.  Also discuss use of nitroglycerin and activating EMS.  Review options of places to exercise outside of rehab.  Review guidelines for sex with heart disease.   Cardiac Drugs I:  -Group instruction provided by verbal instruction and written materials to support subject.  Instructor reviews cardiac drug  classes: antiplatelets, anticoagulants, beta blockers, and statins.  Instructor discusses reasons, side effects, and lifestyle considerations for each drug class.   Cardiac Drugs II:  -Group instruction provided by verbal instruction and written materials to support subject.  Instructor reviews cardiac drug classes: angiotensin converting enzyme inhibitors (ACE-I), angiotensin II receptor blockers (ARBs), nitrates, and calcium channel blockers.  Instructor discusses reasons, side effects, and lifestyle considerations for each drug class.   Anatomy and Physiology of the Circulatory System:  Group verbal and written instruction and models provide basic cardiac anatomy and physiology, with the coronary electrical and arterial systems. Review of: AMI, Angina, Valve disease, Heart Failure, Peripheral Artery Disease, Cardiac Arrhythmia, Pacemakers, and the ICD.   Other Education:  -Group or individual verbal, written, or video instructions that support the educational goals of the cardiac rehab program.   Holiday Eating Survival Tips:  -Group instruction provided by PowerPoint slides, verbal discussion, and written materials to support subject matter. The instructor gives patients tips, tricks, and techniques to help them not only survive but enjoy the holidays despite the onslaught of food that accompanies the holidays.   Knowledge Questionnaire Score:  Knowledge Questionnaire Score - 01/02/22 1020       Knowledge Questionnaire Score   Pre Score 20/24             Core Components/Risk Factors/Patient Goals at Admission:  Personal Goals and Risk Factors at Admission - 01/02/22 0849       Core Components/Risk Factors/Patient Goals on Admission    Weight Management Yes;Weight Loss    Intervention Weight Management: Provide education and appropriate resources to help participant work on and attain dietary goals.    Admit Weight 207 lb 3.7 oz (94 kg)    Goal Weight: Short Term 201 lb  (91.2 kg)    Goal Weight: Long Term 185 lb (83.9 kg)    Expected Outcomes Short Term: Continue to assess and modify interventions until short term weight is achieved;Long Term: Adherence to nutrition and physical activity/exercise program aimed toward attainment of established weight goal;Weight Loss: Understanding of general recommendations for a balanced deficit meal plan, which promotes 1-2 lb weight loss per week and includes a negative energy balance of [phone] kcal/d  Diabetes Yes    Intervention Provide education about signs/symptoms and action to take for hypo/hyperglycemia.;Provide education about proper nutrition, including hydration, and aerobic/resistive exercise prescription along with prescribed medications to achieve blood glucose in normal ranges: Fasting glucose 65-99 mg/dL    Expected Outcomes Short Term: Participant verbalizes understanding of the signs/symptoms and immediate care of hyper/hypoglycemia, proper foot care and importance of medication, aerobic/resistive exercise and nutrition plan for blood glucose control.;Long Term: Attainment of HbA1C < 7%.    Heart Failure Yes    Intervention Provide a combined exercise and nutrition program that is supplemented with education, support and counseling about heart failure. Directed toward relieving symptoms such as shortness of breath, decreased exercise tolerance, and extremity edema.    Expected Outcomes Improve functional capacity of life;Short term: Attendance in program 2-3 days a week with increased exercise capacity. Reported lower sodium intake. Reported increased fruit and vegetable intake. Reports medication compliance.;Short term: Daily weights obtained and reported for increase. Utilizing diuretic protocols set by physician.;Long term: Adoption of self-care skills and reduction of barriers for early signs and symptoms recognition and intervention leading to self-care maintenance.    Hypertension Yes    Intervention Provide  education on lifestyle modifcations including regular physical activity/exercise, weight management, moderate sodium restriction and increased consumption of fresh fruit, vegetables, and low fat dairy, alcohol moderation, and smoking cessation.;Monitor prescription use compliance.    Expected Outcomes Short Term: Continued assessment and intervention until BP is < 140/69mm HG in hypertensive participants. < 130/49mm HG in hypertensive participants with diabetes, heart failure or chronic kidney disease.;Long Term: Maintenance of blood pressure at goal levels.    Lipids Yes    Intervention Provide education and support for participant on nutrition & aerobic/resistive exercise along with prescribed medications to achieve LDL 70mg , HDL >40mg .    Expected Outcomes Short Term: Participant states understanding of desired cholesterol values and is compliant with medications prescribed. Participant is following exercise prescription and nutrition guidelines.;Long Term: Cholesterol controlled with medications as prescribed, with individualized exercise RX and with personalized nutrition plan. Value goals: LDL < 70mg , HDL > 40 mg.    Stress Yes    Intervention Offer individual and/or small group education and counseling on adjustment to heart disease, stress management and health-related lifestyle change. Teach and support self-help strategies.;Refer participants experiencing significant psychosocial distress to appropriate mental health specialists for further evaluation and treatment. When possible, include family members and significant others in education/counseling sessions.    Expected Outcomes Short Term: Participant demonstrates changes in health-related behavior, relaxation and other stress management skills, ability to obtain effective social support, and compliance with psychotropic medications if prescribed.;Long Term: Emotional wellbeing is indicated by absence of clinically significant psychosocial  distress or social isolation.    Personal Goal Other Yes    Personal Goal Build collateral circulation    Intervention Provide individualized aerobic exercise program to help improve collateral circulation.    Expected Outcomes Patient will begin and maintain a regular aerobic exercise routine.             Core Components/Risk Factors/Patient Goals Review:   Goals and Risk Factor Review     Row Name 01/09/22 0807 01/30/22 1611           Core Components/Risk Factors/Patient Goals Review   Personal Goals Review Weight Management/Obesity;Heart Failure;Stress;Hypertension;Lipids;Diabetes Weight Management/Obesity;Heart Failure;Stress;Hypertension;Lipids;Diabetes      Review Bill started CR on 01/08/22. Bill did fair with exercise reported feeling dizzy after getting off the bike. VSS. CBG WNL  as [name] is diet controlled [name] has been doing well with exercise. Vital signs and CBg's have been stable. [name] has not had any further episodes of feeling light headed at exercise. Bill reports feeling stronger.      Expected Outcomes Bill will contibue to participate in phase 2 cardiac rehab for exercise, nutrition and lifestyle modifications. [name] will contibue to participate in phase 2 cardiac rehab for exercise, nutrition and lifestyle modifications.               Core Components/Risk Factors/Patient Goals at Discharge (Final Review):   Goals and Risk Factor Review - 01/30/22 1611       Core Components/Risk Factors/Patient Goals Review   Personal Goals Review Weight Management/Obesity;Heart Failure;Stress;Hypertension;Lipids;Diabetes    Review [name] has been doing well with exercise. Vital signs and CBg's have been stable. [name] has not had any further episodes of feeling light headed at exercise. Bill reports feeling stronger.    Expected Outcomes Bill will contibue to participate in phase 2 cardiac rehab for exercise, nutrition and lifestyle modifications.             ITP  Comments:  ITP Comments     Row Name 01/02/22 0847 01/02/22 0919 01/09/22 0801 01/30/22 1608     ITP Comments Medical Director- Dr. Fransico Him, MD -- 30 Day ITP Review. Bill started exercise at cardiac rehab on 01/08/22. Bill did fair with exercise. 30 Day ITP Review. [name] has good attendance and participation in phase 2 cardiac rehab. Bill reports feeling stronger.             Comments: See ITP Comments

## 2022-02-02 ENCOUNTER — Other Ambulatory Visit: Payer: Self-pay

## 2022-02-02 ENCOUNTER — Encounter (HOSPITAL_COMMUNITY)
Admission: RE | Admit: 2022-02-02 | Discharge: 2022-02-02 | Disposition: A | Discharge status: Home / Self Care | Payer: Medicare Other | Source: Ambulatory Visit | Attending: Cardiology | Admitting: Cardiology

## 2022-02-02 DIAGNOSIS — Z5189 Encounter for other specified aftercare: Secondary | ICD-10-CM | POA: Diagnosis not present

## 2022-02-02 DIAGNOSIS — I5020 Unspecified systolic (congestive) heart failure: Secondary | ICD-10-CM

## 2022-02-05 ENCOUNTER — Encounter (HOSPITAL_COMMUNITY)
Admission: RE | Admit: 2022-02-05 | Discharge: 2022-02-05 | Disposition: A | Discharge status: Home / Self Care | Payer: Medicare Other | Source: Ambulatory Visit | Attending: Cardiology | Admitting: Cardiology

## 2022-02-05 ENCOUNTER — Other Ambulatory Visit: Payer: Self-pay

## 2022-02-05 DIAGNOSIS — Z5189 Encounter for other specified aftercare: Secondary | ICD-10-CM | POA: Diagnosis not present

## 2022-02-05 DIAGNOSIS — I5020 Unspecified systolic (congestive) heart failure: Secondary | ICD-10-CM

## 2022-02-07 ENCOUNTER — Encounter (HOSPITAL_COMMUNITY)
Admission: RE | Admit: 2022-02-07 | Discharge: 2022-02-07 | Disposition: A | Discharge status: Home / Self Care | Payer: Medicare Other | Source: Ambulatory Visit | Attending: Cardiology | Admitting: Cardiology

## 2022-02-07 ENCOUNTER — Other Ambulatory Visit: Payer: Self-pay

## 2022-02-07 DIAGNOSIS — I5020 Unspecified systolic (congestive) heart failure: Secondary | ICD-10-CM

## 2022-02-07 DIAGNOSIS — Z5189 Encounter for other specified aftercare: Secondary | ICD-10-CM | POA: Diagnosis not present

## 2022-02-09 ENCOUNTER — Other Ambulatory Visit: Payer: Self-pay

## 2022-02-09 ENCOUNTER — Encounter (HOSPITAL_COMMUNITY)
Admission: RE | Admit: 2022-02-09 | Discharge: 2022-02-09 | Disposition: A | Discharge status: Home / Self Care | Payer: Medicare Other | Source: Ambulatory Visit | Attending: Cardiology | Admitting: Cardiology

## 2022-02-09 ENCOUNTER — Telehealth (HOSPITAL_COMMUNITY): Payer: Self-pay | Admitting: Family Medicine

## 2022-02-09 DIAGNOSIS — I5020 Unspecified systolic (congestive) heart failure: Secondary | ICD-10-CM

## 2022-02-09 DIAGNOSIS — Z5189 Encounter for other specified aftercare: Secondary | ICD-10-CM | POA: Diagnosis not present

## 2022-02-09 LAB — GLUCOSE, CAPILLARY: Glucose-Capillary: 103 mg/dL — ABNORMAL HIGH (ref 70–99)

## 2022-02-09 NOTE — Progress Notes (Signed)
Initial BP 86/52 heart rate 73. Patient asymptomatic given water. Recheck BP 105/70 sitting. Standing BP 111/66. Anthony Alvarez proceeded with exercise reported feeling lightheaded 13 minutes into exercise on the nustep. Blood pressure 102/60. Exercise stopped. Telemetry rhythm Sinus Rhythm 97. Patient taken off the nustep and assisted times two to the chair with his feet elevated. Recheck BP 101/65. Oxygen saturation 97% on room air. Darrick Grinder NP paged and notified. No new orders received. Anthony Alvarez continued to complain of feeling fatigued denied feeling lightheaded anymore. Amy said that Mr Athans is okay to proceed with exercise. I advised that Anthony Alvarez not exercise anymore today. Anthony Alvarez plans to return to  exercise on Monday. Exit BP 97/61 with feet elevated, heart rate 73. Sitting blood pressure 99/62. Standing blood pressure 111/66.Will fax exercise flow sheets to Dr. Clayborne Dana office for review. Anthony Alvarez left cardiac rehab without further symptoms. Will continue to monitor the patient throughout  the program.Gottlieb Zuercher Venetia Maxon, RN,BSN 02/09/2022 1:26 PM

## 2022-02-12 ENCOUNTER — Encounter (HOSPITAL_COMMUNITY)
Admission: RE | Admit: 2022-02-12 | Discharge: 2022-02-12 | Disposition: A | Discharge status: Home / Self Care | Payer: Medicare Other | Source: Ambulatory Visit | Attending: Cardiology | Admitting: Cardiology

## 2022-02-12 ENCOUNTER — Other Ambulatory Visit: Payer: Self-pay

## 2022-02-12 DIAGNOSIS — I5020 Unspecified systolic (congestive) heart failure: Secondary | ICD-10-CM

## 2022-02-12 DIAGNOSIS — Z5189 Encounter for other specified aftercare: Secondary | ICD-10-CM | POA: Diagnosis not present

## 2022-02-12 NOTE — Progress Notes (Signed)
Bill returned to exercise today at cardiac rehab. Weight today is 93.3 kg which is down 2.0 kg from Friday's exercise session. Vital signs were stable during exercise today. Post exercise BP 90/57 heart rate 80.Bill reported feeling somewhat lightheaded. Patient was given more water repeat blood pressure 101/67 sitting. Standing BP 100/60. Annette Stable said that he took half of his coreg and entresto this morning on his own. Left message with the heart failure clinic about today's BP's and symptoms. Bill left cardiac rehab without complaints. Will continue to monitor the patient throughout  the program.Maisa Bedingfield Mila Palmer RN BSN

## 2022-02-14 ENCOUNTER — Other Ambulatory Visit: Payer: Self-pay

## 2022-02-14 ENCOUNTER — Encounter (HOSPITAL_COMMUNITY)
Admission: RE | Admit: 2022-02-14 | Discharge: 2022-02-14 | Disposition: A | Discharge status: Home / Self Care | Payer: Medicare Other | Source: Ambulatory Visit | Attending: Cardiology | Admitting: Cardiology

## 2022-02-14 DIAGNOSIS — I5022 Chronic systolic (congestive) heart failure: Secondary | ICD-10-CM | POA: Diagnosis present

## 2022-02-14 DIAGNOSIS — I5020 Unspecified systolic (congestive) heart failure: Secondary | ICD-10-CM

## 2022-02-15 ENCOUNTER — Telehealth (HOSPITAL_COMMUNITY): Payer: Self-pay | Admitting: *Deleted

## 2022-02-15 ENCOUNTER — Ambulatory Visit (HOSPITAL_COMMUNITY)
Admission: RE | Admit: 2022-02-15 | Discharge: 2022-02-15 | Disposition: A | Discharge status: Home / Self Care | Payer: Medicare Other | Source: Ambulatory Visit | Attending: Cardiology | Admitting: Cardiology

## 2022-02-15 DIAGNOSIS — I5022 Chronic systolic (congestive) heart failure: Secondary | ICD-10-CM | POA: Insufficient documentation

## 2022-02-15 LAB — BASIC METABOLIC PANEL
Anion gap: 9 (ref 5–15)
BUN: 24 mg/dL — ABNORMAL HIGH (ref 8–23)
CO2: 27 mmol/L (ref 22–32)
Calcium: 9.3 mg/dL (ref 8.9–10.3)
Chloride: 103 mmol/L (ref 98–111)
Creatinine, Ser: 1.07 mg/dL (ref 0.61–1.24)
GFR, Estimated: >60 mL/min (ref >60)
Glucose, Bld: 112 mg/dL — ABNORMAL HIGH (ref 70–99)
Potassium: 4.2 mmol/L (ref 3.5–5.1)
Sodium: 139 mmol/L (ref 135–145)

## 2022-02-15 NOTE — Telephone Encounter (Signed)
? ?  Anthony Alvarez w/cardiac rehab called to report pts bp was low and he had made some med adjustments of his own. (See below) I called pt to follow up. Pt said his bp is lower after cardiac rehab and he is slightly dizzy but that the dizziness is pretty consistent and not any worse during or after exercise. Pt said he tried taking medications after cardiac rehab and said his bp was better but there was still slight dizziness. Pt asked if he should decrease any medications.  ? ?Routed to Dr.Bensimhon for advice  ? ?Anthony Alvarez returned to exercise today at cardiac rehab. Weight today is 93.3 kg which is down 2.0 kg from Friday's exercise session. Vital signs were stable during exercise today. Post exercise BP 90/57 heart rate 80.Anthony Alvarez reported feeling somewhat lightheaded. Patient was given more water repeat blood pressure 101/67 sitting. Standing BP 100/60. Annette Stable said that he took half of his coreg and entresto this morning on his own. Left message with the heart failure clinic about today's BP's and symptoms. Anthony Alvarez left cardiac rehab without complaints. Will continue to monitor the patient throughout  the program.Maria Mila Palmer RN BSN   ?  ?  ?  ?Electronically signed by Cammy Copa, RN ?

## 2022-02-16 ENCOUNTER — Other Ambulatory Visit: Payer: Self-pay

## 2022-02-16 ENCOUNTER — Encounter (HOSPITAL_COMMUNITY)
Admission: RE | Admit: 2022-02-16 | Discharge: 2022-02-16 | Disposition: A | Discharge status: Home / Self Care | Payer: Medicare Other | Source: Ambulatory Visit | Attending: Cardiology | Admitting: Cardiology

## 2022-02-16 DIAGNOSIS — I5020 Unspecified systolic (congestive) heart failure: Secondary | ICD-10-CM

## 2022-02-16 DIAGNOSIS — I5022 Chronic systolic (congestive) heart failure: Secondary | ICD-10-CM | POA: Diagnosis not present

## 2022-02-19 ENCOUNTER — Encounter (HOSPITAL_COMMUNITY)
Admission: RE | Admit: 2022-02-19 | Discharge: 2022-02-19 | Disposition: A | Discharge status: Home / Self Care | Payer: Medicare Other | Source: Ambulatory Visit | Attending: Cardiology | Admitting: Cardiology

## 2022-02-19 ENCOUNTER — Other Ambulatory Visit: Payer: Self-pay

## 2022-02-19 DIAGNOSIS — I5020 Unspecified systolic (congestive) heart failure: Secondary | ICD-10-CM

## 2022-02-19 DIAGNOSIS — I5022 Chronic systolic (congestive) heart failure: Secondary | ICD-10-CM | POA: Diagnosis not present

## 2022-02-19 MED ORDER — TORSEMIDE 20 MG PO TABS: 20.0000 mg | ORAL_TABLET | Freq: Every day | ORAL | 3 refills | Status: DC

## 2022-02-19 NOTE — Telephone Encounter (Signed)
Please see note from Alton Revere below ? ?"Hello Anthony Alvarez [name]'s weight is up 2.6 kg from Friday. He is not any more short of breath and his oxygen saturations are 99% on room air" ? ?Routed to Dr.Bensimhon ?

## 2022-02-19 NOTE — Telephone Encounter (Signed)
Spoke with pt he is aware and agreeable with plan. Pt will call Friday with bp readings.  ?

## 2022-02-19 NOTE — Progress Notes (Signed)
Anthony Alvarez is 95.6 kg which is up 2.6 kg from Friday. Anthony Alvarez denies any increased shortness of breath Alvarez during exercise. Oxygen saturations 99% on room air. Will notify the heart failure clinic. Anthony Alvarez will take his daily medications once he gets home. Slightly diminished breath sounds left posterior base. Anthony Alvarez is wearing compression stockings unable to appreciate any significant edema. Anthony left cardiac rehab without complaints. Will continue to monitor the patient throughout  the program.Anthony Alvarez [name], RN,BSN ?02/19/2022 12:05 PM  ?

## 2022-02-20 MED ORDER — TORSEMIDE 20 MG PO TABS: ORAL_TABLET | ORAL | 3 refills | Status: DC

## 2022-02-20 NOTE — Addendum Note (Signed)
Addended by: Modesta Messing on: 02/20/2022 12:12 PM ? ? Modules accepted: Orders ? ?

## 2022-02-20 NOTE — Telephone Encounter (Signed)
Pt aware. Pt said his bp is better. Will schedule office visit.  ?

## 2022-02-21 ENCOUNTER — Telehealth (HOSPITAL_COMMUNITY): Payer: Self-pay | Admitting: Family Medicine

## 2022-02-21 ENCOUNTER — Encounter (HOSPITAL_COMMUNITY): Payer: Medicare Other

## 2022-02-23 ENCOUNTER — Encounter (HOSPITAL_COMMUNITY)
Admission: RE | Admit: 2022-02-23 | Discharge: 2022-02-23 | Disposition: A | Discharge status: Home / Self Care | Payer: Medicare Other | Source: Ambulatory Visit | Attending: Cardiology | Admitting: Cardiology

## 2022-02-23 ENCOUNTER — Other Ambulatory Visit: Payer: Self-pay

## 2022-02-23 DIAGNOSIS — I5022 Chronic systolic (congestive) heart failure: Secondary | ICD-10-CM | POA: Diagnosis not present

## 2022-02-23 DIAGNOSIS — I5020 Unspecified systolic (congestive) heart failure: Secondary | ICD-10-CM

## 2022-02-26 ENCOUNTER — Other Ambulatory Visit: Payer: Self-pay

## 2022-02-26 ENCOUNTER — Encounter (HOSPITAL_COMMUNITY)
Admission: RE | Admit: 2022-02-26 | Discharge: 2022-02-26 | Disposition: A | Discharge status: Home / Self Care | Payer: Medicare Other | Source: Ambulatory Visit | Attending: Cardiology | Admitting: Cardiology

## 2022-02-26 DIAGNOSIS — I5020 Unspecified systolic (congestive) heart failure: Secondary | ICD-10-CM

## 2022-02-26 DIAGNOSIS — I5022 Chronic systolic (congestive) heart failure: Secondary | ICD-10-CM | POA: Diagnosis not present

## 2022-02-27 NOTE — Progress Notes (Signed)
Cardiac Individual Treatment Plan ? ?Patient Details  ?Name: Peterson AoWilliam Sick ?MRN: [id number] ?Date of Birth: [birth year] ?Referring Provider:   ?Flowsheet Row CARDIAC REHAB PHASE II ORIENTATION [facility]02[facility]20[facility]  ?Referring Provider Marykay LexHarding, David W, MD  ? ?  ? ? ?Initial Encounter Date:  ?Flowsheet Row CARDIAC REHAB [facility]ASE [facility]ATION [facility]  ?Date 01/02/22  ? ?  ? ? ?Visit Diagnosis: HFrEF (heart failure with reduced ejection fraction) (HCC) ? ?Patient's Home Medications on Admission: ? ?Current Outpatient Medications:  ?  acetaminophen (TYLENOL) 500 MG tablet, Take 500-1,000 mg by mouth every 8 (eight) hours as needed for mild pain., Disp: , Rfl:  ?  amphetamine-dextroamphetamine (ADDERALL) 20 MG tablet, Take 10 mg by mouth 4 (four) times daily as needed (focus)., Disp: , Rfl:  ?  aspirin EC 81 MG tablet, Take 81 mg by mouth daily., Disp: , Rfl:  ?  B Complex Vitamins (B-COMPLEX/B-12 PO), Take 500 mg by mouth daily., Disp: , Rfl:  ?  carvedilol (COREG) 6.25 MG tablet, Take 1 tablet (6.25 mg total) by mouth 2 (two) times daily with a meal., Disp: 60 tablet, Rfl: 5 ?  Cholecalciferol (VITAMIN D3) 50 MCG (2000 UT) capsule, Take 2,000 Units by mouth daily., Disp: , Rfl:  ?  dapagliflozin propanediol (FARXIGA) 10 MG TABS tablet, Take 1 tablet (10 mg total) by mouth daily., Disp: 30 tablet, Rfl: 5 ?  Evolocumab (REPATHA SURECLICK) 140 MG/ML SOAJ, Inject 140 mg into the skin every 14 (fourteen) days., Disp: 6 mL, Rfl: 3 ?  glucose blood test strip, TEST FOUR TIMES DAILY OR AS DIRECTED, Disp: 100 each, Rfl: 1 ?  Lancets (ONETOUCH ULTRASOFT) lancets, Use as instructed, Disp: 100 each, Rfl: 1 ?  MAGNESIUM PO, Take 1 tablet by mouth daily., Disp: , Rfl:  ?  Melatonin 10 MG CAPS, Take 10 mg by mouth at bedtime as needed (sleep)., Disp: , Rfl:  ?  Menaquinone-7 (K2 PO), Take 100 mg by mouth daily., Disp: , Rfl:  ?  Multiple Vitamin  (MULTI-VITAMIN) tablet, Take 1 tablet by mouth daily., Disp: , Rfl:  ?  NIACIN PO, Take 700 mg by mouth at bedtime., Disp: , Rfl:  ?  Potassium 99 MG TABS, Take 1 tablet by mouth daily in the afternoon., Disp: , Rfl:  ?  sacubitril-valsartan (ENTRESTO) 24-26 MG, Take 1 tablet by mouth 2 (two) times daily., Disp: 60 tablet, Rfl: 5 ?  sildenafil (VIAGRA) 50 MG tablet, Take 50 mg by mouth daily as needed for erectile dysfunction., Disp: , Rfl:  ?  terbinafine (LAMISIL) 250 MG tablet, Take 250 mg by mouth See admin instructions. 250 mg oral daily for 7 days every month, last week of the month, Disp: , Rfl:  ?  torsemide (DEMADEX) 20 MG tablet, Take 20mg  daily alternating with 40mg  daily, Disp: 90 tablet, Rfl: 3 ? ?Past Medical History: ?Past Medical History:  ?Diagnosis Date  ? CHF (congestive heart failure) (HCC)   ? Chronic HFrEF (heart failure with reduced ejection fraction) (HCC) 11/17/2021  ? EF down to 20-25% with Acute Presentation of HFrEF - Global & LAD territory HK.  ? Coronary artery disease involving coronary bypass graft of native heart 08/12/2019  ? SVG-rPDA & SVG-RPL 100% occluded.  ? HLD (hyperlipidemia)   ? Hyperlipidemia associated with type 2 diabetes mellitus (HCC) 11/30/2019  ? Ischemic cardiomyopathy 11/30/2019  ? Post NSTEMI-CABG, EF increased from 35% to 45% =>  11/2021 Acute on Chronic HFrEF -> EF downt to 20-25%. Cath with both SVGs to RPDA & PL along with native RCA CTO.  Patent LIMA-D3(LAD).  L-RPDA collaterals.  = titration of meds limited by Hypotension.  ? Multivessel CAD - 100% LAD (with Diag), 99% mRCA (subtotal occlusio) 08/12/2019  ? NSTEMI (non-ST elevated myocardial infarction) (HCC) 08/10/2019  ? S/P Off Pump CABG x 3 08/13/2019  ? LIMA to LAD RSVG to PDA RSVG to PLVB  ? ? ?Tobacco Use: ?Social History  ? ?Tobacco Use  ?Smoking Status Former  ? Types: Cigars  ?Smokeless Tobacco Never  ? ? ?Labs: ?Recent Review Flowsheet Data   ? ? Labs for ITP Cardiac and Pulmonary Rehab Latest Ref  Rng & Units 12/21/2019 08/24/2021 11/19/2021 11/20/2021 11/20/2021  ? Cholestrol 0 - 200 mg/dL 94(B) 096(G) 75 - -  ? LDLCALC 0 - 99 mg/dL 27 836(O) 18 - -  ? HDL >40 mg/dL 29(U) 76(L) 46(T) - -  ? Trlycerides <150 mg/dL 035(W) 656(C) 127 - -  ? Hemoglobin A1c 4.8 - 5.6 % - - - - -  ? PHART 7.350 - 7.450 - - - - 7.420  ? PCO2ART 32.0 - 48.0 mmHg - - - - 35.0  ? HCO3 20.0 - 28.0 mmol/L - - - 24.3 22.7  ? TCO2 22 - 32 mmol/L - - - 26 24  ? ACIDBASEDEF 0.0 - 2.0 mmol/L - - - 1.0 1.0  ? O2SAT % - - - 66.0 94.0  ? ?  ? ? ?Capillary Blood Glucose: ?Lab Results  ?Component Value Date  ? GLUCAP 103 (H) 02/09/2022  ? GLUCAP 121 (H) 01/10/2022  ? GLUCAP 103 (H) 01/08/2022  ? GLUCAP 118 (H) 01/08/2022  ? GLUCAP 145 (H) 08/17/2019  ? ? ? ?Exercise Target Goals: ?Exercise Program Goal: ?Individual exercise prescription set using results from initial 6 min walk test and THRR while considering  patient?s activity barriers and safety.  ? ?Exercise Prescription Goal: ?Initial exercise prescription builds to 30-45 minutes a day of aerobic activity, 2-3 days per week.  Home exercise guidelines will be given to patient during program as part of exercise prescription that the participant will acknowledge. ? ?Activity Barriers & Risk Stratification: ? Activity Barriers & Cardiac Risk Stratification - 01/02/22 0919   ? ?  ? Activity Barriers & Cardiac Risk Stratification  ? Activity Barriers Assistive Device;History of Falls   ? Cardiac Risk Stratification High   ? ?  ?  ? ?  ? ? ?6 Minute Walk: ? 6 Minute Walk   ? ? Row Name 01/02/22 0930 02/26/22 1053  ?  ?  ? 6 Minute Walk  ? Phase Initial Discharge   ? Distance 1645 feet 1666 feet   ? Distance % Change -- 1.28 %   ? Distance Feet Change -- 21 ft   ? Walk Time 6 minutes 6 minutes   ? # of Rest Breaks 0 0   ? MPH 3.11 3.15   ? METS 3.64 3.62   ? RPE 11 11   ? Perceived Dyspnea  1 1   ? VO2 Peak 12.75 12.68   ? Symptoms Yes (comment) Yes (comment)   ? Comments Mild SOB Mild SOB   ? Resting HR  84 bpm 78 bpm   ? Resting BP 102/64 104/68   ? Resting Oxygen Saturation  96 % 98 %   ? Exercise Oxygen Saturation  during 6 min walk 98 % 98 %   ?  Max Ex. HR 119 bpm 110 bpm   ? Max Ex. BP 118/72 124/70   ? 2 Minute Post BP 120/70 124/78   ? ?  ?  ? ?  ? ? ?Oxygen Initial Assessment: ? ? ?Oxygen Re-Evaluation: ? ? ?Oxygen Discharge (Final Oxygen Re-Evaluation): ? ? ?Initial Exercise Prescription: ? Initial Exercise Prescription - 01/02/22 0900   ? ?  ? Date of Initial Exercise RX and Referring Provider  ? Date 01/02/22   ? Referring Provider Marykay Lex, MD   ? Expected Discharge Date 03/02/22   ?  ? Bike  ? Level 1.3   ? Minutes 15   ? METs 3.61   ?  ? NuStep  ? Level 3   ? SPM 85   ? Minutes 15   ? METs 2.8   ?  ? Prescription Details  ? Frequency (times per week) 3   ? Duration Progress to 30 minutes of continuous aerobic without signs/symptoms of physical distress   ?  ? Intensity  ? THRR 40-80% of Max Heartrate 61-122   ? Ratings of Perceived Exertion 11-13   ? Perceived Dyspnea 0-4   ?  ? Progression  ? Progression Continue to progress workloads to maintain intensity without signs/symptoms of physical distress.   ?  ? Resistance Training  ? Training Prescription Yes   ? Weight 4 lbs   ? Reps 10-15   ? ?  ?  ? ?  ? ? ?Perform Capillary Blood Glucose checks as needed. ? ?Exercise Prescription Changes: ? ? Exercise Prescription Changes   ? ? Row Name 01/08/22 1021 01/15/22 1025 01/31/22 1024 02/12/22 1026 02/26/22 1024  ?  ? Response to Exercise  ? Blood Pressure (Admit) 98/60 108/68 106/60 102/60 104/68  ? Blood Pressure (Exercise) 118/70 108/70 108/64 118/66 124/70  ? Blood Pressure (Exit) 97/68 112/70 100/54 90/57 98/58   ? Heart Rate (Admit) 81 bpm 71 bpm 69 bpm 79 bpm 78 bpm  ? Heart Rate (Exercise) 114 bpm 100 bpm 114 bpm 97 bpm 121 bpm  ? Heart Rate (Exit) 91 bpm 62 bpm 79 bpm 80 bpm 72 bpm  ? Oxygen Saturation (Exercise) 99 % -- -- -- --  ? Rating of Perceived Exertion (Exercise) 13 11 11 10 10   ?  Perceived Dyspnea (Exercise) 1.5 0 0 0 0  ? Symptoms Mild SOB, lightheaded None None None None  ? Comments Patient tolerated low intensity exercise fair, c/o SOB, lightheaded on stationary bike. -- -- 

## 2022-02-28 ENCOUNTER — Encounter (HOSPITAL_COMMUNITY)
Admission: RE | Admit: 2022-02-28 | Discharge: 2022-02-28 | Disposition: A | Discharge status: Home / Self Care | Payer: Medicare Other | Source: Ambulatory Visit | Attending: Cardiology | Admitting: Cardiology

## 2022-02-28 ENCOUNTER — Other Ambulatory Visit: Payer: Self-pay

## 2022-02-28 ENCOUNTER — Telehealth (HOSPITAL_COMMUNITY): Payer: Self-pay | Admitting: *Deleted

## 2022-02-28 ENCOUNTER — Telehealth (HOSPITAL_COMMUNITY): Payer: Self-pay | Admitting: Emergency Medicine

## 2022-02-28 DIAGNOSIS — I5022 Chronic systolic (congestive) heart failure: Secondary | ICD-10-CM | POA: Diagnosis not present

## 2022-02-28 DIAGNOSIS — I5020 Unspecified systolic (congestive) heart failure: Secondary | ICD-10-CM

## 2022-02-28 NOTE — Telephone Encounter (Signed)
Returning patient's call regarding upcoming cardiac imaging study; pt verbalizes understanding of appt date/time, parking situation and where to check in, and verified current allergies; name and call back number provided for further questions should they arise ? ?Larey Brick RN Navigator Cardiac Imaging ?[facility] ?[phone] office ?[phone] cell ? ?Patient reports metal wires that are MRI safe after consulting with MRI tech. He is unsure if he is claustrophobic.  ?

## 2022-02-28 NOTE — Telephone Encounter (Signed)
Attempted to call patient regarding upcoming cardiac MR appointment. Left message on voicemail with name and callback number Deklen Popelka RN Navigator Cardiac Imaging [facility] [phone] Office [phone] Cell  

## 2022-03-01 ENCOUNTER — Ambulatory Visit (HOSPITAL_COMMUNITY)
Admission: RE | Admit: 2022-03-01 | Discharge: 2022-03-01 | Disposition: A | Discharge status: Home / Self Care | Payer: Medicare Other | Source: Ambulatory Visit | Attending: Internal Medicine | Admitting: Internal Medicine

## 2022-03-01 DIAGNOSIS — I5022 Chronic systolic (congestive) heart failure: Secondary | ICD-10-CM | POA: Insufficient documentation

## 2022-03-01 MED ORDER — GADOBUTROL 1 MMOL/ML IV SOLN
13.0000 mL | Freq: Once | INTRAVENOUS | Status: AC | PRN
  Administered 2022-03-01: 13 mL via INTRAVENOUS

## 2022-03-02 ENCOUNTER — Encounter (HOSPITAL_COMMUNITY)
Admission: RE | Admit: 2022-03-02 | Discharge: 2022-03-02 | Disposition: A | Discharge status: Home / Self Care | Payer: Medicare Other | Source: Ambulatory Visit | Attending: Cardiology | Admitting: Cardiology

## 2022-03-02 ENCOUNTER — Other Ambulatory Visit: Payer: Self-pay

## 2022-03-02 VITALS — BP 102/62 | HR 86 | Ht 70.0 in | Wt 208.3 lb

## 2022-03-02 DIAGNOSIS — I5022 Chronic systolic (congestive) heart failure: Secondary | ICD-10-CM | POA: Diagnosis not present

## 2022-03-02 DIAGNOSIS — I5020 Unspecified systolic (congestive) heart failure: Secondary | ICD-10-CM

## 2022-03-02 NOTE — Progress Notes (Signed)
Discharge Progress Report ? ?Patient Details  ?Name: Anthony Alvarez ?MRN: [id number] ?Date of Birth: Oct 20, 1956 ?Referring Provider:   ?Flowsheet Row CARDIAC REHAB PHASE II ORIENTATION from 01/02/2022 in [place]  ?Referring Provider [name], MD  ? ?  ? ? ? ?Number of Visits: 22 ? ?Reason for Discharge:  ?Patient reached a stable level of exercise. ?Patient has met program and personal goals. ? ?Smoking History:  ?Social History  ? ?Tobacco Use  ?Smoking Status Former  ? Types: Cigars  ?Smokeless Tobacco Never  ? ? ?Diagnosis:  ?HFrEF (heart failure with reduced ejection fraction) ([place]) ? ?ADL [facility]: ? ? ?Initial Exercise Prescription: ? Initial Exercise Prescription - 01/02/22 0900   ? ?  ? Date of Initial Exercise RX and Referring Provider  ? Date 01/02/22   ? Referring Provider [name], MD   ? Expected Discharge Date 03/02/22   ?  ? Bike  ? Level 1.3   ? Minutes 15   ? METs 3.61   ?  ? NuStep  ? Level 3   ? SPM 85   ? Minutes 15   ? METs 2.8   ?  ? Prescription Details  ? Frequency (times per week) 3   ? Duration Progress to 30 minutes of continuous aerobic without signs/symptoms of physical distress   ?  ? Intensity  ? THRR 40-80% of Max Heartrate 61-122   ? Ratings of Perceived Exertion 11-13   ? Perceived Dyspnea 0-4   ?  ? Progression  ? Progression Continue to progress workloads to maintain intensity without signs/symptoms of physical distress.   ?  ? Resistance Training  ? Training Prescription Yes   ? Weight 4 lbs   ? Reps 10-15   ? ?  ?  ? ?  ? ? ?Discharge Exercise Prescription (Final Exercise Prescription Changes): ? Exercise Prescription Changes - 03/02/22 1023   ? ?  ? Response to Exercise  ? Blood Pressure (Admit) 102/62   ? Blood Pressure (Exercise) 116/72   ? Blood Pressure (Exit) 102/62   ? Heart Rate (Admit) 86 bpm   ? Heart Rate (Exercise) 118 bpm   ? Heart Rate (Exit) 91 bpm   ? Rating of Perceived Exertion (Exercise) 11   ? Perceived Dyspnea  (Exercise) 0   ? Symptoms None   ? Comments Patient completed the cardiac rehab program.   ? Duration Continue with 30 min of aerobic exercise without signs/symptoms of physical distress.   ? Intensity THRR unchanged   ?  ? Progression  ? Progression Continue to progress workloads to maintain intensity without signs/symptoms of physical distress.   ? Average METs 3.2   ?  ? Resistance Training  ? Training Prescription Yes   ? Weight 5 lbs   ? Reps 10-15   ? Time 10 Minutes   ?  ? Interval Training  ? Interval Training No   ?  ? Recumbant Bike  ? Level 2.5   ? Minutes 15   ? METs 3.8   ?  ? NuStep  ? Level 3   ? SPM 85   ? Minutes 15   ? METs 2.6   ?  ? Home Exercise Plan  ? Plans to continue exercise at Home (comment)   Walking  ? Frequency Add 2 additional days to program exercise sessions.   ? Initial Home Exercises Provided 01/31/22   ? ?  ?  ? ?  ? ? ?  Functional Capacity: ? 6 Minute Walk   ? ? [place] Name 01/02/22 0930 02/26/22 1053  ?  ?  ? 6 Minute Walk  ? Phase Initial Discharge   ? Distance 1645 feet 1666 feet   ? Distance % Change -- 1.28 %   ? Distance Feet Change -- 21 ft   ? Walk Time 6 minutes 6 minutes   ? # of Rest Breaks 0 0   ? MPH 3.11 3.15   ? METS 3.64 3.62   ? RPE 11 11   ? Perceived Dyspnea  1 1   ? VO2 Peak 12.75 12.68   ? Symptoms Yes (comment) Yes (comment)   ? Comments Mild SOB Mild SOB   ? Resting HR 84 bpm 78 bpm   ? Resting BP 102/64 104/68   ? Resting Oxygen Saturation  96 % 98 %   ? Exercise Oxygen Saturation  during 6 min walk 98 % 98 %   ? Max Ex. HR 119 bpm 110 bpm   ? Max Ex. BP 118/72 124/70   ? 2 Minute Post BP 120/70 124/78   ? ?  ?  ? ?  ? ? ?Psychological, QOL, Others - Outcomes: ?PHQ 2/9: ? ?  03/02/2022  ? 10:37 AM 01/02/2022  ?  9:24 AM  ?Depression screen PHQ 2/9  ?Decreased Interest 0 0  ?Down, Depressed, Hopeless 0 0  ?PHQ - 2 Score 0 0  ? ? ?Quality of Life: ? Quality of Life - 02/28/22 1201   ? ?  ? Quality of Life  ? Select Quality of Life   ?  ? Quality of Life Scores  ?  Health/Function Pre 17.47 %   ? Health/Function Post 19.5 %   ? Health/Function % Change 11.62 %   ? Socioeconomic Pre 25.43 %   ? Socioeconomic Post 25.43 %   ? Socioeconomic % Change  0 %   ? Psych/Spiritual Pre 25.79 %   ? Psych/Spiritual Post 26.57 %   ? Psych/Spiritual % Change 3.02 %   ? Family Pre 24 %   ? Family Post 26.4 %   ? Family % Change 10 %   ? GLOBAL Pre 21.78 %   ? GLOBAL Post 23.19 %   ? GLOBAL % Change 6.47 %   ? ?  ?  ? ?  ? ? ?Personal Goals: ?Goals established at orientation with interventions provided to work toward goal. ? Personal Goals and Risk Factors at Admission - 01/02/22 0849   ? ?  ? Core Components/Risk Factors/Patient Goals on Admission  ?  Weight Management Yes;Weight Loss   ? Intervention Weight Management: Provide education and appropriate resources to help participant work on and attain dietary goals.   ? Admit Weight 207 lb 3.7 oz (94 kg)   ? Goal Weight: Short Term 201 lb (91.2 kg)   ? Goal Weight: Long Term 185 lb (83.9 kg)   ? Expected Outcomes Short Term: Continue to assess and modify interventions until short term weight is achieved;Long Term: Adherence to nutrition and physical activity/exercise program aimed toward attainment of established weight goal;Weight Loss: Understanding of general recommendations for a balanced deficit meal plan, which promotes 1-2 lb weight loss per week and includes a negative energy balance of [phone] kcal/d   ? Diabetes Yes   ? Intervention Provide education about signs/symptoms and action to take for hypo/hyperglycemia.;Provide education about proper nutrition, including hydration, and aerobic/resistive exercise prescription along with prescribed medications to  achieve blood glucose in normal ranges: Fasting glucose 65-99 mg/dL   ? Expected Outcomes Short Term: Participant verbalizes understanding of the signs/symptoms and immediate care of hyper/hypoglycemia, proper foot care and importance of medication, aerobic/resistive exercise and  nutrition plan for blood glucose control.;Long Term: Attainment of HbA1C < 7%.   ? Heart Failure Yes   ? Intervention Provide a combined exercise and nutrition program that is supplemented with education, support and counseling about heart failure. Directed toward relieving symptoms such as shortness of breath, decreased exercise tolerance, and extremity edema.   ? Expected Outcomes Improve functional capacity of life;Short term: Attendance in program 2-3 days a week with increased exercise capacity. Reported lower sodium intake. Reported increased fruit and vegetable intake. Reports medication compliance.;Short term: Daily weights obtained and reported for increase. Utilizing diuretic protocols set by physician.;Long term: Adoption of self-care skills and reduction of barriers for early signs and symptoms recognition and intervention leading to self-care maintenance.   ? Hypertension Yes   ? Intervention Provide education on lifestyle modifcations including regular physical activity/exercise, weight management, moderate sodium restriction and increased consumption of fresh fruit, vegetables, and low fat dairy, alcohol moderation, and smoking cessation.;Monitor prescription use compliance.   ? Expected Outcomes Short Term: Continued assessment and intervention until BP is < 140/2mm HG in hypertensive participants. < 130/85mm HG in hypertensive participants with diabetes, heart failure or chronic kidney disease.;Long Term: Maintenance of blood pressure at goal levels.   ? Lipids Yes   ? Intervention Provide education and support for participant on nutrition & aerobic/resistive exercise along with prescribed medications to achieve LDL '70mg'$ , HDL >$Remo'40mg'jEeYu$ .   ? Expected Outcomes Short Term: Participant states understanding of desired cholesterol values and is compliant with medications prescribed. Participant is following exercise prescription and nutrition guidelines.;Long Term: Cholesterol controlled with medications as  prescribed, with individualized exercise RX and with personalized nutrition plan. Value goals: LDL < $Rem'70mg'eHqw$ , HDL > 40 mg.   ? Stress Yes   ? Intervention Offer individual and/or small group education and co

## 2022-03-23 ENCOUNTER — Encounter: Payer: Self-pay | Admitting: *Deleted

## 2022-03-23 ENCOUNTER — Ambulatory Visit: Payer: Medicare Other | Admitting: Cardiology

## 2022-03-23 ENCOUNTER — Encounter: Payer: Self-pay | Admitting: Cardiology

## 2022-03-23 VITALS — BP 96/60 | HR 64 | Ht 70.0 in | Wt 205.8 lb

## 2022-03-23 DIAGNOSIS — I447 Left bundle-branch block, unspecified: Secondary | ICD-10-CM

## 2022-03-23 DIAGNOSIS — Z01818 Encounter for other preprocedural examination: Secondary | ICD-10-CM | POA: Diagnosis not present

## 2022-03-23 DIAGNOSIS — I255 Ischemic cardiomyopathy: Secondary | ICD-10-CM

## 2022-03-23 DIAGNOSIS — I5022 Chronic systolic (congestive) heart failure: Secondary | ICD-10-CM

## 2022-03-23 LAB — BASIC METABOLIC PANEL
BUN/Creatinine Ratio: 19 (ref 10–24)
BUN: 24 mg/dL (ref 8–27)
CO2: 23 mmol/L (ref 20–29)
Calcium: 10.5 mg/dL — ABNORMAL HIGH (ref 8.6–10.2)
Chloride: 102 mmol/L (ref 96–106)
Creatinine, Ser: 1.24 mg/dL (ref 0.76–1.27)
Glucose: 99 mg/dL (ref 70–99)
Potassium: 5 mmol/L (ref 3.5–5.2)
Sodium: 144 mmol/L (ref 134–144)
eGFR: 64 mL/min/{1.73_m2} (ref >59)

## 2022-03-23 LAB — CBC WITH DIFFERENTIAL/PLATELET
Basophils Absolute: 0.1 10*3/uL (ref 0.0–0.2)
Basos: 1 %
EOS (ABSOLUTE): 0.3 10*3/uL (ref 0.0–0.4)
Eos: 3 %
Hematocrit: 49.4 % (ref 37.5–51.0)
Hemoglobin: 17.2 g/dL (ref 13.0–17.7)
Immature Grans (Abs): 0 10*3/uL (ref 0.0–0.1)
Immature Granulocytes: 0 %
Lymphocytes Absolute: 3.2 10*3/uL — ABNORMAL HIGH (ref 0.7–3.1)
Lymphs: 33 %
MCH: 30.7 pg (ref 26.6–33.0)
MCHC: 34.8 g/dL (ref 31.5–35.7)
MCV: 88 fL (ref 79–97)
Monocytes Absolute: 0.9 10*3/uL (ref 0.1–0.9)
Monocytes: 10 %
Neutrophils Absolute: 5 10*3/uL (ref 1.4–7.0)
Neutrophils: 53 %
Platelets: 204 10*3/uL (ref 150–450)
RBC: 5.6 x10E6/uL (ref 4.14–5.80)
RDW: 13.2 % (ref 11.6–15.4)
WBC: 9.5 10*3/uL (ref 3.4–10.8)

## 2022-03-23 NOTE — Patient Instructions (Addendum)
Medication Instructions:  ?Your physician recommends that you continue on your current medications as directed. Please refer to the Current Medication list given to you today. ?*If you need a refill on your cardiac medications before your next appointment, please call your pharmacy* ? ?Lab Work: ?None. ?If you have labs (blood work) drawn today and your tests are completely normal, you will receive your results only by: ?MyChart Message (if you have MyChart) OR ?A paper copy in the mail ?If you have any lab test that is abnormal or we need to change your treatment, we will call you to review the results. ? ?Testing/Procedures: ?CBC, BMP ? ?Follow-Up: ?At [facility], you and your health needs are our priority.  As part of our continuing mission to provide you with exceptional heart care, we have created designated Provider Care Teams.  These Care Teams include your primary Cardiologist (physician) and Advanced Practice Providers (APPs -  Physician Assistants and Nurse Practitioners) who all work together to provide you with the care you need, when you need it. ? ?Your physician wants you to follow-up in: see instruction letter.  ? ?We recommend signing up for the patient portal called "MyChart".  Sign up information is provided on this After Visit Summary.  MyChart is used to connect with patients for Virtual Visits (Telemedicine).  Patients are able to view lab/test results, encounter notes, upcoming appointments, etc.  Non-urgent messages can be sent to your provider as well.   ?To learn more about what you can do with MyChart, go to [email or web address].   ? ?Any Other Special Instructions Will Be Listed Below (If Applicable). ? ? ?Cardioverter Defibrillator Implantation ?An implantable cardioverter defibrillator (ICD) is a device that identifies and corrects abnormal heart rhythms. Cardioverter defibrillator implantation is a surgery to place an ICD under the skin in the chest or abdomen. An ICD has a  battery, a small computer (pulse generator), and wires (leads) that go into the heart. The ICD detects and corrects two types of dangerous irregular heart rhythms (arrhythmias): ?A rapid heart rhythm in the lower chambers of the heart (ventricles). This is called ventricular tachycardia. ?The ventricles contracting in an uncoordinated way. This is called ventricular fibrillation. ?There are different types of ICDs, and the electrical signals from the ICD can be programmed differently based on the condition being treated. The electrical signals from the ICD can be low-energy pulses, high-energy shocks, or a combination of the two. The low-energy pulses are generally used to restore the heartbeat to normal when it is either too slow (bradycardia) or too fast. These pulses are painless. The high-energy shocks are used to treat abnormal rhythms such as ventricular tachycardia or ventricular fibrillation. This shock may feel like a strong jolt in the chest. ?Your health care provider may recommend an ICD if you have: ?Had a ventricular arrhythmia in the past. ?A damaged heart because of a disease or heart condition. ?A weakened heart muscle from a heart attack or cardiac arrest. ?A congenital heart defect. ?Long QT syndrome, which is a disorder of the heart's electrical system. ?Brugada syndrome, which is a condition that causes a disruption of the heart's normal rhythm. ?Tell a health care provider about: ?Any allergies you have. ?All medicines you are taking, including vitamins, herbs, eye drops, creams, and over-the-counter medicines. ?Any problems you or family members have had with anesthetic medicines. ?Any blood disorders you have. ?Any surgeries you have had. ?Any medical conditions you have. ?Whether you are pregnant or may be pregnant. ?What  are the risks? ?Generally, this is a safe procedure. However, problems may occur, including: ?Infection. ?Bleeding. ?Allergic reactions to medicines used during the  procedure. ?Blood clots. ?Swelling or bruising. ?Damage to nearby structures or organs, such as nerves, lungs, blood vessels, or the heart where the ICD leads or pulse generator is implanted. ?What happens before the procedure? ?Staying hydrated ?Follow instructions from your health care provider about hydration, which may include: ?Up to 2 hours before the procedure - you may continue to drink clear liquids, such as water, clear fruit juice, black coffee, and plain tea. ? ?Eating and drinking restrictions ?Follow instructions from your health care provider about eating and drinking, which may include: ?8 hours before the procedure - stop eating heavy meals or foods, such as meat, fried foods, or fatty foods. ?6 hours before the procedure - stop eating light meals or foods, such as toast or cereal. ?6 hours before the procedure - stop drinking milk or drinks that contain milk. ?2 hours before the procedure - stop drinking clear liquids. ?Medicines ?Ask your health care provider about: ?Changing or stopping your regular medicines. This is especially important if you are taking diabetes medicines or blood thinners. ?Taking medicines such as aspirin and ibuprofen. These medicines can thin your blood. Do not take these medicines unless your health care provider tells you to take them. ?Taking over-the-counter medicines, vitamins, herbs, and supplements. ?Tests ?You may have an exam or testing. These may include: ?Blood tests. ?A test to check the electrical signals in your heart (electrocardiogram, ECG). ?Imaging tests, such as a chest X-ray. ?Echocardiogram. This is an ultrasound of your heart to evaluate your heart structures and function. ?An event monitor or Holter monitor to wear at home. ?General instructions ?Do not use any products that contain nicotine or tobacco for at least 4 weeks before the procedure. These products include cigarettes, chewing tobacco, and vaping devices, such as e-cigarettes. If you need  help quitting, ask your health care provider. ?Ask your health care provider: ?How your procedure site will be marked. ?What steps will be taken to help prevent infection. These may include: ?Removing hair at the surgery site. ?Washing skin with a germ-killing soap. ?Taking antibiotic medicine. ?You may be asked to shower with a germ-killing soap. ?Plan to have a responsible adult take you home from the hospital or clinic. ?What happens during the procedure? ? ?Small monitors will be put on your body. They will be used to check your heart rate, blood pressure, and oxygen level. ?A pair of sticky pads (defibrillator pads) may be placed on your back and chest. These pads are able to pace your heart as needed during the procedure. ?An IV will be inserted into one of your veins. ?You will be given one or more of the following: ?A medicine to help you relax (sedative). ?A medicine to numb the area (local anesthetic). ?A medicine to make you fall asleep(general anesthetic). ?A small incision will be made to create a deep pocket under the skin of your chest or abdomen. ?Leads will be guided through a blood vessel into your heart and attached to your heart muscles. Depending on the ICD, the leads may go into one ventricle, or they may go into both ventricles and into an upper chamber of the heart. An X-ray machine (fluoroscope) will be used to help guide the leads. The other end of the leads will be attached to the pulse generator. ?The pulse generator will be placed into the pocket under the  skin. ?The ICD will be tested, and your health care provider will program the ICD for the condition being treated. ?The incision will be closed with stitches (sutures), skin glue, adhesive strips, or staples. ?A bandage (dressing) will be placed over the incision. ?The procedure may vary among health care providers and hospitals. ?What happens after the procedure? ?Your blood pressure, heart rate, breathing rate, and blood oxygen level  will be monitored until you leave the hospital or clinic. Your health care provider will also monitor your ICD to make sure it is working properly. ?A chest X-ray will be taken to check that the ICD is in the ri

## 2022-03-23 NOTE — Progress Notes (Signed)
?Electrophysiology Office Note:   ? ?Date:  03/23/2022  ? ?ID:  Anthony Alvarez, DOB 05/09/1956, MRN [id number] ? ?PCP:  Daisy Floro, MD  ?[facility] Cardiologist:  [name], MD  ?[facility] Electrophysiologist:  None  ? ?Referring MD: Dolores Patty, MD  ? ?Chief Complaint: New patient consult for CRT-D ? ?History of Present Illness:   ? ?Anthony Alvarez is a 66 y.o. male who presents for a consult to discuss CRT-D at the request of Dr. Gala Romney. Their medical history includes NSTEMI s/p off pump CABG x3, chronic HFrEF, CAD, ischemic cardiomyopathy, hyperlipidemia, and type 2 diabetes mellitus. ? ?He saw Dr. Gala Romney on 01/29/2022. He was participating in cardiac rehab 3 times a week. He reported occasional positional dizziness with standing. On Adderall 3 times a week. Lasix was switched to torsemide 40 mg daily + 20 KCL. His EKG at that visit showed sinus rhythm, LBBB, and qrs 174 msec. He was referred to EP for CRT-D. ? ?Per Dr. Prescott Gum notes: ?Admitted 8/20 with NSTEMI. LHC with severe 2v CAD with totalled LAD. LVEF at that time was 35 to 45%.  He underwent CABG with LIMA to LAD, SVG to PDA, SVG to PL 08/13/2019 with Dr. Cliffton Asters. In 2021 EF was up to 45-50%. Admitted with 11/17/21 with ADHF. Cath with severe 2V CAD with CTO of proximal LAD and mild RCA. LIMA patent.  Chronically occluded SVG-rPDA and SVG-rPL. ECHO EF 20-25%  ? ?He is accompanied by his wife. Overall, he appears well. ? ?His wife reports he has intermittent episodes of significant fatigue and daytime somnolence. These episodes may last for 2 days. ? ?We discussed at length the procedural details of a CRT-D implant. ? ?He denies any palpitations, chest pain, shortness of breath, or peripheral edema. No lightheadedness, headaches, syncope, orthopnea, or PND. ? ? ? ?  ?Past Medical History:  ?Diagnosis Date  ? CHF (congestive heart failure) (HCC)   ? Chronic HFrEF (heart failure with reduced ejection fraction) (HCC)  11/17/2021  ? EF down to 20-25% with Acute Presentation of HFrEF - Global & LAD territory HK.  ? Coronary artery disease involving coronary bypass graft of native heart 08/12/2019  ? SVG-rPDA & SVG-RPL 100% occluded.  ? HLD (hyperlipidemia)   ? Hyperlipidemia associated with type 2 diabetes mellitus (HCC) 11/30/2019  ? Ischemic cardiomyopathy 11/30/2019  ? Post NSTEMI-CABG, EF increased from 35% to 45% => 11/2021 Acute on Chronic HFrEF -> EF downt to 20-25%. Cath with both SVGs to RPDA & PL along with native RCA CTO.  Patent LIMA-D3(LAD).  L-RPDA collaterals.  = titration of meds limited by Hypotension.  ? Multivessel CAD - 100% LAD (with Diag), 99% mRCA (subtotal occlusio) 08/12/2019  ? NSTEMI (non-ST elevated myocardial infarction) (HCC) 08/10/2019  ? S/P Off Pump CABG x 3 08/13/2019  ? LIMA to LAD RSVG to PDA RSVG to PLVB  ? ? ?Past Surgical History:  ?Procedure Laterality Date  ? CARDIAC CATHETERIZATION    ? CORONARY ARTERY BYPASS GRAFT N/A 08/13/2019  ? Procedure: OFF PUMP CORONARY ARTERY BYPASS GRAFTING (CABG) x 2 WITH ENDOSCOPIC HARVESTING OF RIGHT GREATER SAPHENOUS VEIN;  Surgeon: Corliss Skains, MD;  Location: [facility] OR;  Service: Open Heart Surgery: LIMA-LAD, SVG-PDA, SVG-PL.  ? LEFT HEART CATH AND CORONARY ANGIOGRAPHY N/A 08/11/2019  ? Procedure: LEFT HEART CATH AND CORONARY ANGIOGRAPHY;  Surgeon: Swazil[place], [name], MD;  Location: [facility] INVASIVE CV LAB;  Service: Cardiovascular;  p-mLAD 100%, mRCA thrombotic 95%, rPAV 95%.  EF mod  reduced, ~40%. EDP 24 mmHg --> CABG referral  ? RIGHT/LEFT HEART CATH AND CORONARY/GRAFT ANGIOGRAPHY N/A 11/20/2021  ? Procedure: RIGHT/LEFT HEART CATH AND CORONARY/GRAFT ANGIOGRAPHY;  Surgeon: Yvonne Kendall, MD;  Location: [facility] INVASIVE CV LAB;; Severe 2 V CAD - CTO of pLAD & mRCA. RPAD 95%. Mod (~50) distal LCx disease.  LIMA-D3 (Intramyocardial LAD) patent - backfills LAD. CTO of SVG-rPDA & SVG-RPL. Mild-Moderately elevated LVEDP. Normal RHC Pressures. Mildly reduced CO-CI by  Fick.  ? TEE WITHOUT CARDIOVERSION N/A 08/13/2019  ? Procedure: TRANSESOPHAGEAL ECHOCARDIOGRAM (TEE);  Surgeon: Corliss Skains, MD;  Location: [facility] OR;  Service: Open Heart Surgery;;;EF 30-40%.  Apical akinesis.  Trace AI.  Mild MR.  Normal RV.   ? TRANSTHORACIC ECHOCARDIOGRAM  12/21/2019  ? EF improved to 45 and 50%.  Mild LVH.  Moderate HK of the mid anteroapical, inferoapical and inferolateral wall.  Septal motion consistent with LBBB.  Only GR 1 DD.  Moderate reduced RV function.  Normal atrial size.  Aortic root roughly 39 mm  ? TRANSTHORACIC ECHOCARDIOGRAM  08/11/2019  ? (non-STEMI) EF 35%.  Apical septal akinesis, apical akinesis.  Mid apical inferior akinesis and apical lateral akinesis.  GRIII DD.  Trivial MR.  Mild aortic root dilation-4.2 mL.  ? TRANSTHORACIC ECHOCARDIOGRAM Right 11/18/2021  ? Acute CHF presentation (in setting of Flu-PNA) => Severe global HK.  Akinetic LAD territory.  EF 20 to 25%.  Moderate LV dilation.  GRII DD.  Moderate is RV function.  AOV sclerosis but no stenosis.  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  Trace AI.  Normal RAP.  ? ? ?Current Medications: ?Current Meds  ?Medication Sig  ? acetaminophen (TYLENOL) 500 MG tablet Take 500-1,000 mg by mouth every 8 (eight) hours as needed for mild pain.  ? ALPRAZolam (XANAX) 0.25 MG tablet Take 0.25 mg by mouth daily as needed.  ? amphetamine-dextroamphetamine (ADDERALL) 20 MG tablet Take 10 mg by mouth 4 (four) times daily as needed (focus).  ? aspirin EC 81 MG tablet Take 81 mg by mouth daily.  ? B Complex Vitamins (B-COMPLEX/B-12 PO) Take 500 mg by mouth daily.  ? carvedilol (COREG) 6.25 MG tablet Take 1 tablet (6.25 mg total) by mouth 2 (two) times daily with a meal.  ? Cholecalciferol (VITAMIN D3) 50 MCG (2000 UT) capsule Take 2,000 Units by mouth daily.  ? dapagliflozin propanediol (FARXIGA) 10 MG TABS tablet Take 1 tablet (10 mg total) by mouth daily.  ? Evolocumab (REPATHA SURECLICK) 140 MG/ML SOAJ Inject 140 mg into the skin every 14  (fourteen) days.  ? glucose blood test strip TEST FOUR TIMES DAILY OR AS DIRECTED  ? Lancets (ONETOUCH ULTRASOFT) lancets Use as instructed  ? MAGNESIUM PO Take 1 tablet by mouth daily.  ? Melatonin 10 MG CAPS Take 10 mg by mouth at bedtime as needed (sleep).  ? Menaquinone-7 (K2 PO) Take 100 mg by mouth daily.  ? Multiple Vitamin (MULTI-VITAMIN) tablet Take 1 tablet by mouth daily.  ? NIACIN PO Take 800 mg by mouth at bedtime.  ? Potassium 99 MG TABS Take 1 tablet by mouth daily in the afternoon.  ? sacubitril-valsartan (ENTRESTO) 24-26 MG Take 1 tablet by mouth 2 (two) times daily.  ? terbinafine (LAMISIL) 250 MG tablet Take 250 mg by mouth See admin instructions. 250 mg oral daily for 7 days every month, last week of the month  ? torsemide (DEMADEX) 20 MG tablet Take 20mg  daily alternating with 40mg  daily  ?  ? ?Allergies:  Atorvastatin, Icosapent ethyl, and Rosuvastatin  ? ?Social History  ? ?Socioeconomic History  ? Marital status: Married  ?  Spouse name: Not on file  ? Number of children: Not on file  ? Years of education: Not on file  ? Highest education level: Not on file  ?Occupational History  ? Not on file  ?Tobacco Use  ? Smoking status: Former  ?  Types: Cigars  ? Smokeless tobacco: Never  ?Vaping Use  ? Vaping Use: Never used  ?Substance and Sexual Activity  ? Alcohol use: Not Currently  ?  Comment: not in the past 6 months  ? Drug use: Not Currently  ? Sexual activity: Not on file  ?Other Topics Concern  ? Not on file  ?Social History Narrative  ? Not on file  ? ?Social Determinants of Health  ? ?Financial Resource Strain: Low Risk   ? Difficulty of Paying Living Expenses: Not very hard  ?Food Insecurity: No Food Insecurity  ? Worried About Programme researcher, broadcasting/film/videounning Out of Food in the Last Year: Never true  ? Ran Out of Food in the Last Year: Never true  ?Transportation Needs: No Transportation Needs  ? Lack of Transportation (Medical): No  ? Lack of Transportation (Non-Medical): No  ?Physical Activity: Not on file   ?Stress: Not on file  ?Social Connections: Not on file  ?  ? ?Family History: ?The patient's family history is not on file. ? ?ROS:   ?Please see the history of present illness.    ?(+) Fatigue ?(+) Daytime s

## 2022-03-23 NOTE — H&P (View-Only) (Signed)
?Electrophysiology Office Note:   ? ?Date:  03/23/2022  ? ?ID:  Anthony Alvarez, DOB 05/09/1956, MRN [id number] ? ?PCP:  Daisy Floro, MD  ?[facility] Cardiologist:  [name], MD  ?[facility] Electrophysiologist:  None  ? ?Referring MD: Dolores Patty, MD  ? ?Chief Complaint: New patient consult for CRT-D ? ?History of Present Illness:   ? ?Anthony Alvarez is a 66 y.o. male who presents for a consult to discuss CRT-D at the request of Dr. Gala Romney. Their medical history includes NSTEMI s/p off pump CABG x3, chronic HFrEF, CAD, ischemic cardiomyopathy, hyperlipidemia, and type 2 diabetes mellitus. ? ?He saw Dr. Gala Romney on 01/29/2022. He was participating in cardiac rehab 3 times a week. He reported occasional positional dizziness with standing. On Adderall 3 times a week. Lasix was switched to torsemide 40 mg daily + 20 KCL. His EKG at that visit showed sinus rhythm, LBBB, and qrs 174 msec. He was referred to EP for CRT-D. ? ?Per Dr. Prescott Gum notes: ?Admitted 8/20 with NSTEMI. LHC with severe 2v CAD with totalled LAD. LVEF at that time was 35 to 45%.  He underwent CABG with LIMA to LAD, SVG to PDA, SVG to PL 08/13/2019 with Dr. Cliffton Asters. In 2021 EF was up to 45-50%. Admitted with 11/17/21 with ADHF. Cath with severe 2V CAD with CTO of proximal LAD and mild RCA. LIMA patent.  Chronically occluded SVG-rPDA and SVG-rPL. ECHO EF 20-25%  ? ?He is accompanied by his wife. Overall, he appears well. ? ?His wife reports he has intermittent episodes of significant fatigue and daytime somnolence. These episodes may last for 2 days. ? ?We discussed at length the procedural details of a CRT-D implant. ? ?He denies any palpitations, chest pain, shortness of breath, or peripheral edema. No lightheadedness, headaches, syncope, orthopnea, or PND. ? ? ? ?  ?Past Medical History:  ?Diagnosis Date  ? CHF (congestive heart failure) (HCC)   ? Chronic HFrEF (heart failure with reduced ejection fraction) (HCC)  11/17/2021  ? EF down to 20-25% with Acute Presentation of HFrEF - Global & LAD territory HK.  ? Coronary artery disease involving coronary bypass graft of native heart 08/12/2019  ? SVG-rPDA & SVG-RPL 100% occluded.  ? HLD (hyperlipidemia)   ? Hyperlipidemia associated with type 2 diabetes mellitus (HCC) 11/30/2019  ? Ischemic cardiomyopathy 11/30/2019  ? Post NSTEMI-CABG, EF increased from 35% to 45% => 11/2021 Acute on Chronic HFrEF -> EF downt to 20-25%. Cath with both SVGs to RPDA & PL along with native RCA CTO.  Patent LIMA-D3(LAD).  L-RPDA collaterals.  = titration of meds limited by Hypotension.  ? Multivessel CAD - 100% LAD (with Diag), 99% mRCA (subtotal occlusio) 08/12/2019  ? NSTEMI (non-ST elevated myocardial infarction) (HCC) 08/10/2019  ? S/P Off Pump CABG x 3 08/13/2019  ? LIMA to LAD RSVG to PDA RSVG to PLVB  ? ? ?Past Surgical History:  ?Procedure Laterality Date  ? CARDIAC CATHETERIZATION    ? CORONARY ARTERY BYPASS GRAFT N/A 08/13/2019  ? Procedure: OFF PUMP CORONARY ARTERY BYPASS GRAFTING (CABG) x 2 WITH ENDOSCOPIC HARVESTING OF RIGHT GREATER SAPHENOUS VEIN;  Surgeon: Corliss Skains, MD;  Location: [facility] OR;  Service: Open Heart Surgery: LIMA-LAD, SVG-PDA, SVG-PL.  ? LEFT HEART CATH AND CORONARY ANGIOGRAPHY N/A 08/11/2019  ? Procedure: LEFT HEART CATH AND CORONARY ANGIOGRAPHY;  Surgeon: Swaziland, [name], MD;  Location: [facility] INVASIVE CV LAB;  Service: Cardiovascular;  p-mLAD 100%, mRCA thrombotic 95%, rPAV 95%.  EF mod  reduced, ~40%. EDP 24 mmHg --> CABG referral  ? RIGHT/LEFT HEART CATH AND CORONARY/GRAFT ANGIOGRAPHY N/A 11/20/2021  ? Procedure: RIGHT/LEFT HEART CATH AND CORONARY/GRAFT ANGIOGRAPHY;  Surgeon: Yvonne Kendall, MD;  Location: [facility] INVASIVE CV LAB;; Severe 2 V CAD - CTO of pLAD & mRCA. RPAD 95%. Mod (~50) distal LCx disease.  LIMA-D3 (Intramyocardial LAD) patent - backfills LAD. CTO of SVG-rPDA & SVG-RPL. Mild-Moderately elevated LVEDP. Normal RHC Pressures. Mildly reduced CO-CI by  Fick.  ? TEE WITHOUT CARDIOVERSION N/A 08/13/2019  ? Procedure: TRANSESOPHAGEAL ECHOCARDIOGRAM (TEE);  Surgeon: Corliss Skains, MD;  Location: [facility] OR;  Service: Open Heart Surgery;;;EF 30-40%.  Apical akinesis.  Trace AI.  Mild MR.  Normal RV.   ? TRANSTHORACIC ECHOCARDIOGRAM  12/21/2019  ? EF improved to 45 and 50%.  Mild LVH.  Moderate HK of the mid anteroapical, inferoapical and inferolateral wall.  Septal motion consistent with LBBB.  Only GR 1 DD.  Moderate reduced RV function.  Normal atrial size.  Aortic root roughly 39 mm  ? TRANSTHORACIC ECHOCARDIOGRAM  08/11/2019  ? (non-STEMI) EF 35%.  Apical septal akinesis, apical akinesis.  Mid apical inferior akinesis and apical lateral akinesis.  GRIII DD.  Trivial MR.  Mild aortic root dilation-4.2 mL.  ? TRANSTHORACIC ECHOCARDIOGRAM Right 11/18/2021  ? Acute CHF presentation (in setting of Flu-PNA) => Severe global HK.  Akinetic LAD territory.  EF 20 to 25%.  Moderate LV dilation.  GRII DD.  Moderate is RV function.  AOV sclerosis but no stenosis.  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  Trace AI.  Normal RAP.  ? ? ?Current Medications: ?Current Meds  ?Medication Sig  ? acetaminophen (TYLENOL) 500 MG tablet Take 500-1,000 mg by mouth every 8 (eight) hours as needed for mild pain.  ? ALPRAZolam (XANAX) 0.25 MG tablet Take 0.25 mg by mouth daily as needed.  ? amphetamine-dextroamphetamine (ADDERALL) 20 MG tablet Take 10 mg by mouth 4 (four) times daily as needed (focus).  ? aspirin EC 81 MG tablet Take 81 mg by mouth daily.  ? B Complex Vitamins (B-COMPLEX/B-12 PO) Take 500 mg by mouth daily.  ? carvedilol (COREG) 6.25 MG tablet Take 1 tablet (6.25 mg total) by mouth 2 (two) times daily with a meal.  ? Cholecalciferol (VITAMIN D3) 50 MCG (2000 UT) capsule Take 2,000 Units by mouth daily.  ? dapagliflozin propanediol (FARXIGA) 10 MG TABS tablet Take 1 tablet (10 mg total) by mouth daily.  ? Evolocumab (REPATHA SURECLICK) 140 MG/ML SOAJ Inject 140 mg into the skin every 14  (fourteen) days.  ? glucose blood test strip TEST FOUR TIMES DAILY OR AS DIRECTED  ? Lancets (ONETOUCH ULTRASOFT) lancets Use as instructed  ? MAGNESIUM PO Take 1 tablet by mouth daily.  ? Melatonin 10 MG CAPS Take 10 mg by mouth at bedtime as needed (sleep).  ? Menaquinone-7 (K2 PO) Take 100 mg by mouth daily.  ? Multiple Vitamin (MULTI-VITAMIN) tablet Take 1 tablet by mouth daily.  ? NIACIN PO Take 800 mg by mouth at bedtime.  ? Potassium 99 MG TABS Take 1 tablet by mouth daily in the afternoon.  ? sacubitril-valsartan (ENTRESTO) 24-26 MG Take 1 tablet by mouth 2 (two) times daily.  ? terbinafine (LAMISIL) 250 MG tablet Take 250 mg by mouth See admin instructions. 250 mg oral daily for 7 days every month, last week of the month  ? torsemide (DEMADEX) 20 MG tablet Take 20mg  daily alternating with 40mg  daily  ?  ? ?Allergies:  Atorvastatin, Icosapent ethyl, and Rosuvastatin  ? ?Social History  ? ?Socioeconomic History  ? Marital status: Married  ?  Spouse name: Not on file  ? Number of children: Not on file  ? Years of education: Not on file  ? Highest education level: Not on file  ?Occupational History  ? Not on file  ?Tobacco Use  ? Smoking status: Former  ?  Types: Cigars  ? Smokeless tobacco: Never  ?Vaping Use  ? Vaping Use: Never used  ?Substance and Sexual Activity  ? Alcohol use: Not Currently  ?  Comment: not in the past 6 months  ? Drug use: Not Currently  ? Sexual activity: Not on file  ?Other Topics Concern  ? Not on file  ?Social History Narrative  ? Not on file  ? ?Social Determinants of Health  ? ?Financial Resource Strain: Low Risk   ? Difficulty of Paying Living Expenses: Not very hard  ?Food Insecurity: No Food Insecurity  ? Worried About Running Out of Food in the Last Year: Never true  ? Ran Out of Food in the Last Year: Never true  ?Transportation Needs: No Transportation Needs  ? Lack of Transportation (Medical): No  ? Lack of Transportation (Non-Medical): No  ?Physical Activity: Not on file   ?Stress: Not on file  ?Social Connections: Not on file  ?  ? ?Family History: ?The patient's family history is not on file. ? ?ROS:   ?Please see the history of present illness.    ?(+) Fatigue ?(+) Daytime s

## 2022-03-23 NOTE — Progress Notes (Deleted)
CAD CABG ?DM ?LBBB ?11/2021 EF 20-25% ? ?03/01/2022 cMR ?EF19% ?Normal RV ? ? ? ?Plan for StJ biV ? ? ?

## 2022-03-24 ENCOUNTER — Encounter: Payer: Self-pay | Admitting: Cardiology

## 2022-04-06 NOTE — Pre-Procedure Instructions (Signed)
Attempted to call patient regarding procedure instructions for Monday.  Left voice mail on the following items: ?Arrival time 1330 ?Nothing to eat or drink after midnight ?No meds AM of procedure ?Responsible person to drive you home and stay with you for 24 hrs ? ? ?   ?

## 2022-04-09 ENCOUNTER — Encounter: Payer: Self-pay | Admitting: Internal Medicine

## 2022-04-09 ENCOUNTER — Ambulatory Visit (HOSPITAL_COMMUNITY)
Admission: RE | Admit: 2022-04-09 | Discharge: 2022-04-10 | Disposition: A | Discharge status: Home / Self Care | Payer: Medicare Other | Attending: Cardiology | Admitting: Cardiology

## 2022-04-09 ENCOUNTER — Other Ambulatory Visit: Payer: Self-pay

## 2022-04-09 ENCOUNTER — Encounter (HOSPITAL_COMMUNITY)
Admission: RE | Disposition: A | Discharge status: Home / Self Care | Payer: Self-pay | Source: Home / Self Care | Attending: Cardiology

## 2022-04-09 DIAGNOSIS — I5022 Chronic systolic (congestive) heart failure: Secondary | ICD-10-CM | POA: Insufficient documentation

## 2022-04-09 DIAGNOSIS — E785 Hyperlipidemia, unspecified: Secondary | ICD-10-CM | POA: Diagnosis not present

## 2022-04-09 DIAGNOSIS — F17210 Nicotine dependence, cigarettes, uncomplicated: Secondary | ICD-10-CM | POA: Insufficient documentation

## 2022-04-09 DIAGNOSIS — I2510 Atherosclerotic heart disease of native coronary artery without angina pectoris: Secondary | ICD-10-CM | POA: Insufficient documentation

## 2022-04-09 DIAGNOSIS — I447 Left bundle-branch block, unspecified: Secondary | ICD-10-CM | POA: Insufficient documentation

## 2022-04-09 DIAGNOSIS — I255 Ischemic cardiomyopathy: Secondary | ICD-10-CM | POA: Insufficient documentation

## 2022-04-09 DIAGNOSIS — E119 Type 2 diabetes mellitus without complications: Secondary | ICD-10-CM | POA: Insufficient documentation

## 2022-04-09 DIAGNOSIS — Z006 Encounter for examination for normal comparison and control in clinical research program: Secondary | ICD-10-CM | POA: Insufficient documentation

## 2022-04-09 DIAGNOSIS — Z955 Presence of coronary angioplasty implant and graft: Secondary | ICD-10-CM | POA: Insufficient documentation

## 2022-04-09 DIAGNOSIS — Z95810 Presence of automatic (implantable) cardiac defibrillator: Secondary | ICD-10-CM | POA: Diagnosis present

## 2022-04-09 DIAGNOSIS — I252 Old myocardial infarction: Secondary | ICD-10-CM | POA: Diagnosis not present

## 2022-04-09 HISTORY — PX: BIV ICD INSERTION CRT-D: EP1195

## 2022-04-09 LAB — BASIC METABOLIC PANEL
Anion gap: 10 (ref 5–15)
BUN: 18 mg/dL (ref 8–23)
CO2: 24 mmol/L (ref 22–32)
Calcium: 9.1 mg/dL (ref 8.9–10.3)
Chloride: 104 mmol/L (ref 98–111)
Creatinine, Ser: 1.33 mg/dL — ABNORMAL HIGH (ref 0.61–1.24)
GFR, Estimated: 59 mL/min — ABNORMAL LOW (ref >60)
Glucose, Bld: 165 mg/dL — ABNORMAL HIGH (ref 70–99)
Potassium: 3.5 mmol/L (ref 3.5–5.1)
Sodium: 138 mmol/L (ref 135–145)

## 2022-04-09 LAB — CBC
HCT: 44 % (ref 39.0–52.0)
Hemoglobin: 15.6 g/dL (ref 13.0–17.0)
MCH: 31.4 pg (ref 26.0–34.0)
MCHC: 35.5 g/dL (ref 30.0–36.0)
MCV: 88.5 fL (ref 80.0–100.0)
Platelets: 175 10*3/uL (ref 150–400)
RBC: 4.97 MIL/uL (ref 4.22–5.81)
RDW: 13.4 % (ref 11.5–15.5)
WBC: 11 10*3/uL — ABNORMAL HIGH (ref 4.0–10.5)
nRBC: 0 % (ref 0.0–0.2)

## 2022-04-09 LAB — GLUCOSE, CAPILLARY: Glucose-Capillary: 115 mg/dL — ABNORMAL HIGH (ref 70–99)

## 2022-04-09 LAB — MAGNESIUM: Magnesium: 2.1 mg/dL (ref 1.7–2.4)

## 2022-04-09 SURGERY — BIV ICD INSERTION CRT-D

## 2022-04-09 MED ORDER — ONDANSETRON HCL 4 MG/2ML IJ SOLN: 4.0000 mg | Freq: Four times a day (QID) | INTRAMUSCULAR | Status: DC | PRN

## 2022-04-09 MED ORDER — SODIUM CHLORIDE 0.9 % IV SOLN
INTRAVENOUS | Status: AC
  Filled 2022-04-09: qty 2

## 2022-04-09 MED ORDER — MIDAZOLAM HCL 5 MG/5ML IJ SOLN
INTRAMUSCULAR | Status: AC
  Filled 2022-04-09: qty 5

## 2022-04-09 MED ORDER — POVIDONE-IODINE 10 % EX SWAB
2.0000 "application " | Freq: Once | CUTANEOUS | Status: AC
  Administered 2022-04-09: 2 via TOPICAL

## 2022-04-09 MED ORDER — LIDOCAINE HCL (PF) 1 % IJ SOLN
INTRAMUSCULAR | Status: DC | PRN
  Administered 2022-04-09: 60 mL
  Administered 2022-04-09: 20 mL

## 2022-04-09 MED ORDER — DAPAGLIFLOZIN PROPANEDIOL 10 MG PO TABS
10.0000 mg | ORAL_TABLET | Freq: Every day | ORAL | Status: DC
  Administered 2022-04-10: 10 mg via ORAL
  Filled 2022-04-09: qty 1

## 2022-04-09 MED ORDER — CEFAZOLIN SODIUM-DEXTROSE 2-4 GM/100ML-% IV SOLN
INTRAVENOUS | Status: AC
  Filled 2022-04-09: qty 100

## 2022-04-09 MED ORDER — FENTANYL CITRATE (PF) 100 MCG/2ML IJ SOLN
INTRAMUSCULAR | Status: DC | PRN
  Administered 2022-04-09 (×4): 25 ug via INTRAVENOUS

## 2022-04-09 MED ORDER — SODIUM CHLORIDE 0.9 % IV SOLN
80.0000 mg | INTRAVENOUS | Status: AC
  Administered 2022-04-09: 80 mg
  Filled 2022-04-09: qty 2

## 2022-04-09 MED ORDER — CARVEDILOL 6.25 MG PO TABS
6.2500 mg | ORAL_TABLET | Freq: Two times a day (BID) | ORAL | Status: DC
  Administered 2022-04-10: 6.25 mg via ORAL
  Filled 2022-04-09 (×2): qty 1

## 2022-04-09 MED ORDER — FENTANYL CITRATE (PF) 100 MCG/2ML IJ SOLN
INTRAMUSCULAR | Status: AC
  Filled 2022-04-09: qty 2

## 2022-04-09 MED ORDER — MIDAZOLAM HCL 5 MG/5ML IJ SOLN
INTRAMUSCULAR | Status: DC | PRN
  Administered 2022-04-09 (×4): 1 mg via INTRAVENOUS

## 2022-04-09 MED ORDER — HEPARIN (PORCINE) IN NACL 1000-0.9 UT/500ML-% IV SOLN
INTRAVENOUS | Status: DC | PRN
  Administered 2022-04-09: 500 mL

## 2022-04-09 MED ORDER — LIDOCAINE HCL 1 % IJ SOLN
INTRAMUSCULAR | Status: AC
  Filled 2022-04-09: qty 60

## 2022-04-09 MED ORDER — ACETAMINOPHEN 325 MG PO TABS
325.0000 mg | ORAL_TABLET | ORAL | Status: DC | PRN
  Administered 2022-04-09 – 2022-04-10 (×3): 650 mg via ORAL
  Filled 2022-04-09 (×3): qty 2

## 2022-04-09 MED ORDER — CHLORHEXIDINE GLUCONATE 4 % EX LIQD
4.0000 "application " | Freq: Once | CUTANEOUS | Status: DC
  Filled 2022-04-09: qty 60

## 2022-04-09 MED ORDER — CEFAZOLIN SODIUM-DEXTROSE 2-4 GM/100ML-% IV SOLN
2.0000 g | INTRAVENOUS | Status: AC
  Administered 2022-04-09: 2 g via INTRAVENOUS
  Filled 2022-04-09: qty 100

## 2022-04-09 MED ORDER — SACUBITRIL-VALSARTAN 24-26 MG PO TABS
1.0000 | ORAL_TABLET | Freq: Two times a day (BID) | ORAL | Status: DC
  Administered 2022-04-09 – 2022-04-10 (×2): 1 via ORAL
  Filled 2022-04-09 (×2): qty 1

## 2022-04-09 MED ORDER — HEPARIN (PORCINE) IN NACL 1000-0.9 UT/500ML-% IV SOLN
INTRAVENOUS | Status: AC
  Filled 2022-04-09: qty 500

## 2022-04-09 MED ORDER — IOHEXOL 350 MG/ML SOLN
INTRAVENOUS | Status: DC | PRN
  Administered 2022-04-09: 30 mL

## 2022-04-09 MED ORDER — SODIUM CHLORIDE 0.9 % IV SOLN: INTRAVENOUS | Status: DC

## 2022-04-09 MED ORDER — OXYCODONE HCL 5 MG PO TABS
5.0000 mg | ORAL_TABLET | Freq: Once | ORAL | Status: AC
  Administered 2022-04-09: 5 mg via ORAL
  Filled 2022-04-09: qty 1

## 2022-04-09 SURGICAL SUPPLY — 18 items
BALLN COR SINUS VENO 6FR 80 (BALLOONS) ×2
BALLOON COR SINUS VENO 6FR 80 (BALLOONS) IMPLANT
CABLE SURGICAL S-101-97-12 (CABLE) ×2 IMPLANT
CATH ATTAIN COM SUR 6250V-EHXL (CATHETERS) ×1 IMPLANT
ICD GALLANT HFCRTD CDHFA500Q (ICD Generator) ×1 IMPLANT
KIT ESSENTIALS PG (KITS) ×1 IMPLANT
LEAD DURATA 7122Q-65CM (Lead) ×1 IMPLANT
LEAD QUARTET 1458QL-86 (Lead) IMPLANT
LEAD TENDRIL MRI 52CM LPA1200M (Lead) ×1 IMPLANT
PAD DEFIB RADIO PHYSIO CONN (PAD) ×2 IMPLANT
QUARTET 1458QL-86 (Lead) ×2 IMPLANT
SHEATH 7FR PRELUDE SNAP 13 (SHEATH) ×1 IMPLANT
SHEATH 8FR PRELUDE SNAP 13 (SHEATH) ×1 IMPLANT
SHEATH 9.5FR PRELUDE SNAP 13 (SHEATH) ×2 IMPLANT
SLITTER 6232ADJ (MISCELLANEOUS) ×1 IMPLANT
TRAY PACEMAKER INSERTION (PACKS) ×2 IMPLANT
WIRE ACUITY WHISPER EDS 4648 (WIRE) ×2 IMPLANT
WIRE HI TORQ VERSACORE-J 145CM (WIRE) ×1 IMPLANT

## 2022-04-09 NOTE — Interval H&P Note (Signed)
History and Physical Interval Note: ? ?04/09/2022 ?1:53 PM ? ?Anthony Alvarez  has presented today for surgery, with the diagnosis of cradiomyopathy.  The various methods of treatment have been discussed with the patient and family. After consideration of risks, benefits and other options for treatment, the patient has consented to  Procedure(s): ?BIV ICD INSERTION CRT-D (N/A) as a surgical intervention.  The patient's history has been reviewed, patient examined, no change in status, stable for surgery.  I have reviewed the patient's chart and labs.  Questions were answered to the patient's satisfaction.   ? ? ?Anthony Alvarez T [name] ? ? ?

## 2022-04-09 NOTE — Progress Notes (Addendum)
? ?  Pt had VT today to 4 min.  Pt without complaints except for mild dizziness.  Will check labs. His BP is soft 94/59 and has not had BB today.  Discussed with Dr. Johnsie Cancel.   If reoccurs then begin amiodarone.   ? ?Cecilie Kicks, FNP-C ?At [facility]  ?[id number] or after 5pm and on weekends call [phone] ?04/09/2022.now  ? ?

## 2022-04-09 NOTE — Progress Notes (Signed)
Called into the room for sustaining WQRS ? X 4 min. BP taken at 107/77, Saturation at 95 % . Had c/o minimal dizziness. He said it happened right after eating and when he was lowering the head of his bed. Cecilie Kicks,   paged via [employer]/Secure chat  to update.  ?

## 2022-04-09 NOTE — Discharge Instructions (Addendum)
? ? ?  Supplemental Discharge Instructions for  ?Pacemaker/Defibrillator Patients ? ? ?Activity ?No heavy lifting or vigorous activity with your left/right arm for 6 to 8 weeks.  Do not raise your left/right arm above your head for one week.  Gradually raise your affected arm as drawn below. ? ?        ?   04/14/22                     04/15/22                   04/16/22                    04/17/22 ?__ ? ?NO DRIVING for   1 week  ; you may begin driving on   [id number] . ? ?WOUND CARE ?Keep the wound area clean and dry.  Do not get this area wet , no showers for one week; you may shower on  05/08/22 as we discussed  . ?The tape/steri-strips on your wound will fall off; do not pull them off.  No bandage is needed on the site.  DO  NOT apply any creams, oils, or ointments to the wound area. ?If you notice any drainage or discharge from the wound, any swelling or bruising at the site, or you develop a fever > 101? F after you are discharged home, call the office at once. ? ?Special Instructions ?You are still able to use cellular telephones; use the ear opposite the side where you have your pacemaker/defibrillator.  Avoid carrying your cellular phone near your device. ?When traveling through airports, show security personnel your identification card to avoid being screened in the metal detectors.  Ask the security personnel to use the hand wand. ?Avoid arc welding equipment, MRI testing (magnetic resonance imaging), TENS units (transcutaneous nerve stimulators).  Call the office for questions about other devices. ?Avoid electrical appliances that are in poor condition or are not properly grounded. ?Microwave ovens are safe to be near or to operate. ? ?Additional information for defibrillator patients should your device go off: ?If your device goes off ONCE and you feel fine afterward, notify the device clinic nurses. ?If your device goes off ONCE and you do not feel well afterward, call 911. ?If your device goes off TWICE, call  911. ?If your device goes off THREE times in one day, call 911. ? ?DO NOT DRIVE YOURSELF OR A FAMILY MEMBER ?WITH A DEFIBRILLATOR TO THE HOSPITAL--CALL 911. ? ?

## 2022-04-10 ENCOUNTER — Ambulatory Visit (HOSPITAL_COMMUNITY): Payer: Medicare Other

## 2022-04-10 ENCOUNTER — Encounter (HOSPITAL_COMMUNITY): Payer: Self-pay | Admitting: Cardiology

## 2022-04-10 DIAGNOSIS — E119 Type 2 diabetes mellitus without complications: Secondary | ICD-10-CM | POA: Diagnosis not present

## 2022-04-10 DIAGNOSIS — Z95810 Presence of automatic (implantable) cardiac defibrillator: Secondary | ICD-10-CM

## 2022-04-10 DIAGNOSIS — I447 Left bundle-branch block, unspecified: Secondary | ICD-10-CM | POA: Diagnosis not present

## 2022-04-10 DIAGNOSIS — I5022 Chronic systolic (congestive) heart failure: Secondary | ICD-10-CM | POA: Diagnosis not present

## 2022-04-10 DIAGNOSIS — I255 Ischemic cardiomyopathy: Secondary | ICD-10-CM | POA: Diagnosis not present

## 2022-04-10 NOTE — Discharge Summary (Signed)
? ? ? ?ELECTROPHYSIOLOGY PROCEDURE DISCHARGE SUMMARY  ? ? ?Patient ID: Anthony Alvarez,  ?MRN: [id number], DOB/AGE: Apr 22, 1956 66 y.o. ? ?Admit date: 04/09/2022 ?Discharge date: 04/10/2022 ? ?Primary Care Physician: Daisy Floro, MD ?Primary Cardiologist: Dr. Gala Romney ?Electrophysiologist: Dr. Lalla Brothers ? ?Primary Discharge Diagnosis:  ?ICM ?Chronic CHF ?LBBB ? ?S/p CRT-D implant this admission ? ?Secondary Discharge Diagnosis:  ?CAD ?DM ?HLD ? ?Allergies  ?Allergen Reactions  ? Atorvastatin Other (See Comments)  ?  Memory deficit, fatigue, myalgias, insomnia; similar symptoms with rosuvastatin.  ? Icosapent Ethyl   ?  lethargy  ? Rosuvastatin Other (See Comments)  ?  Lethargic, fatigue  ? ? ? ?Procedures This Admission:  ?1.  Implantation of a Abbott CRT- ICD on 04/09/22 by Dr Lalla Brothers.  The patient received a  single coil right ventricular lead (model Durata [id number], serial [id number]), atrial lead (model Tendril MRI [id number], serial [id number]), quadripolar lead (Quartet 1458QL, model [id number]) (CS position), Gallant HF generator (serial [id number]).  ?DFT's were deferred at time of implant.   ?There were no immediate post procedure complications. ?CXR on 04/10/22 demonstrated no pneumothorax status post device implantation.  ? ?Brief HPI: ?Anthony Alvarez is a 66 y.o. male was referred to electrophysiology in the outpatient setting for consideration of ICD implantation.  Past medical history includes above.  The patient has persistent LV dysfunction despite guideline directed therapy.  Risks, benefits, and alternatives to ICD implantation were reviewed with the patient who wished to proceed.  ? ?Hospital Course:  ?The patient was admitted and underwent implantation of a CRT-D with details as outlined above. He was monitored on telemetry overnight which demonstrated SR/V pacing and periods of PMT.  Night staff were concerned of VT, pt was hemodynamically stable and required intervention.  Device check this AM confirmed  retrograde conduction and PMT was reproduced.  PVARP was programmed to cover up his very long retrograde conduction and no further PMTs have been noted .  Left chest was without hematoma or ecchymosis.  The device was interrogated and found to be functioning normally.  CXR was obtained and demonstrated no pneumothorax status post device implantation.  Wound care, arm mobility, and restrictions were reviewed with the patient.  The patient feels well, denies any CP or SOB, minimal site discomfort, he was examined by Dr. Lalla Brothers and considered stable for discharge to home.  ? ?The patient's discharge medications include an ACE/ARB (Entresto) and beta blocker (carvedilol).  ? ?Physical Exam: ?Vitals:  ? 04/09/22 1941 04/09/22 2026 04/10/22 0036 04/10/22 7209  ?BP: (!) 94/59 (!) 97/59 99/64 123/68  ?Pulse: 86 81 70 73  ?Resp: 20 14 14 18   ?Temp: 98.4 ?F (36.9 ?C) 97.8 ?F (36.6 ?C) 97.9 ?F (36.6 ?C) 97.8 ?F (36.6 ?C)  ?TempSrc: Oral Oral Oral Oral  ?SpO2: 97% 99% 97% 98%  ?Weight:      ?Height:      ? ? ?GEN- The patient is well appearing, alert and oriented x 3 today.   ?HEENT: normocephalic, atraumatic; sclera clear, conjunctiva pink; hearing intact; oropharynx clear ?Lungs- CTA b/l, normal work of breathing.  No wheezes, rales, rhonchi ?Heart- RRR, no murmurs, rubs or gallops, PMI not laterally displaced ?GI- soft, non-tender, non-distended ?Extremities- no clubbing, cyanosis, or edema ?MS- no significant deformity or atrophy ?Skin- warm and dry, no rash or lesion, left chest without hematoma/ecchymosis ?Psych- euthymic mood, full affect ?Neuro- no gross defecits ? ?Labs: ?  ?Lab Results  ?Component Value Date  ? WBC 11.0 (H) 04/09/2022  ? HGB  15.6 04/09/2022  ? HCT 44.0 04/09/2022  ? MCV 88.5 04/09/2022  ? PLT 175 04/09/2022  ?  ?Recent Labs  ?Lab 04/09/22 ?2120  ?NA 138  ?K 3.5  ?CL 104  ?CO2 24  ?BUN 18  ?CREATININE 1.33*  ?CALCIUM 9.1  ?GLUCOSE 165*  ? ? ?Discharge Medications:  ?Allergies as of 04/10/2022   ? ?    Reactions  ? Atorvastatin Other (See Comments)  ? Memory deficit, fatigue, myalgias, insomnia; similar symptoms with rosuvastatin.  ? Icosapent Ethyl   ? lethargy  ? Rosuvastatin Other (See Comments)  ? Lethargic, fatigue  ? ?  ? ?  ?Medication List  ?  ? ?TAKE these medications   ? ?acetaminophen 500 MG tablet ?Commonly known as: TYLENOL ?Take 500-1,000 mg by mouth every 8 (eight) hours as needed for mild pain. ?  ?ALPRAZolam 0.25 MG tablet ?Commonly known as: Prudy Feeler ?Take 0.25 mg by mouth daily as needed for anxiety. ?  ?amphetamine-dextroamphetamine 20 MG tablet ?Commonly known as: ADDERALL ?Take 10 mg by mouth 4 (four) times daily as needed (focus). ?  ?aspirin EC 81 MG tablet ?Take 81 mg by mouth daily. ?Notes to patient: Hold for 2 days ?  ?B-COMPLEX/B-12 PO ?Take 500 mg by mouth daily. ?  ?carvedilol 6.25 MG tablet ?Commonly known as: COREG ?Take 1 tablet (6.25 mg total) by mouth 2 (two) times daily with a meal. ?  ?dapagliflozin propanediol 10 MG Tabs tablet ?Commonly known as: FARXIGA ?Take 1 tablet (10 mg total) by mouth daily. ?  ?glucose blood test strip ?TEST FOUR TIMES DAILY OR AS DIRECTED ?  ?K2 PO ?Take 100 mg by mouth daily. ?  ?MAGNESIUM PO ?Take 250 mg by mouth daily. ?  ?Melatonin 10 MG Caps ?Take 10 mg by mouth at bedtime as needed (sleep). ?  ?Multi-Vitamin tablet ?Take 1 tablet by mouth daily. ?  ?niacin 500 MG tablet ?Take 1,000 mg by mouth at bedtime. ?  ?onetouch ultrasoft lancets ?Use as instructed ?  ?Potassium 99 MG Tabs ?Take 99 mg by mouth every other day. ?  ?Repatha SureClick 140 MG/ML Soaj ?Generic drug: Evolocumab ?Inject 140 mg into the skin every 14 (fourteen) days. ?  ?sacubitril-valsartan 24-26 MG ?Commonly known as: ENTRESTO ?Take 1 tablet by mouth 2 (two) times daily. ?  ?terbinafine 250 MG tablet ?Commonly known as: LAMISIL ?Take 250 mg by mouth See admin instructions. 250 mg oral daily for 7 days every month, last week of the month ?  ?torsemide 20 MG tablet ?Commonly known  as: DEMADEX ?Take 20mg  daily alternating with 40mg  daily ?What changed:  ?how much to take ?how to take this ?when to take this ?  ?Vitamin D3 50 MCG (2000 UT) capsule ?Take 2,000 Units by mouth daily. ?  ? ?  ? ? ?Disposition: Home ?Discharge Instructions   ? ? Diet - low sodium heart healthy   Complete by: As directed ?  ? Increase activity slowly   Complete by: As directed ?  ? ?  ? ? ? ?Duration of Discharge Encounter: Greater than 30 minutes including physician time. ? ?Signed, ? , PA-C ?04/10/2022 ?9:10 AM ? ? ? ? ? ?

## 2022-04-10 NOTE — Plan of Care (Signed)

## 2022-04-24 ENCOUNTER — Ambulatory Visit (INDEPENDENT_AMBULATORY_CARE_PROVIDER_SITE_OTHER): Payer: Medicare Other

## 2022-04-24 ENCOUNTER — Ambulatory Visit
Admission: RE | Admit: 2022-04-24 | Discharge: 2022-04-24 | Disposition: A | Discharge status: Home / Self Care | Payer: Medicare Other | Source: Ambulatory Visit | Attending: Cardiology | Admitting: Cardiology

## 2022-04-24 DIAGNOSIS — I255 Ischemic cardiomyopathy: Secondary | ICD-10-CM

## 2022-04-24 NOTE — Patient Instructions (Addendum)
? ?  After Your ICD ?(Implantable Cardiac Defibrillator) ? ? ? ?Monitor your defibrillator site for redness, swelling, and drainage. Call the device clinic at [phone] if you experience these symptoms or fever/chills. ? ?Please go to get chest x ray as soon as you leave this appointment at [address] ?  ? ?Your incision was closed with Steri-strips or staples:  You may shower 7 and wash your incision with soap and water. Avoid lotions, ointments, or perfumes over your incision until it is well-healed. ? ?You may use a hot tub or a pool after your wound check appointment if the incision is completely closed. ? ?Do not lift, push or pull greater than 10 pounds with the affected arm until 6 weeks after your procedure. There are no other restrictions in arm movement after your wound check appointment. June 6 ? ?Your ICD is MRI compatible. ? ?Your ICD is designed to protect you from life threatening heart rhythms. Because of this, you may receive a shock.  ? ?1 shock with no symptoms:  Call the office during business hours. ?1 shock with symptoms (chest pain, chest pressure, dizziness, lightheadedness, shortness of breath, overall feeling unwell):  Call 911. ?If you experience 2 or more shocks in 24 hours:  Call 911. ?If you receive a shock, you should not drive.  ?[place] DMV - no driving for 6 months if you receive appropriate therapy from your ICD.  ? ?ICD Alerts:  Some alerts are vibratory and others beep. These are NOT emergencies. Please call our office to let us know. If this occurs at night or on weekends, it can wait until the next business day. Send a remote transmission. ? ?If your device is capable of reading fluid status (for heart failure), you will be offered monthly monitoring to review this with you.  ? ?Remote monitoring is used to monitor your ICD from home. This monitoring is scheduled every 91 days by our office. It allows [place] to keep an eye on the functioning of your device to ensure it is  working properly. You will routinely see your Electrophysiologist annually (more often if necessary).  ?

## 2022-04-25 LAB — CUP PACEART INCLINIC DEVICE CHECK
Battery Remaining Longevity: 48 mo
Brady Statistic RA Percent Paced: 5.4 %
Brady Statistic RV Percent Paced: 98 %
Date Time Interrogation Session: 20230509084100
HighPow Impedance: 59.625
Implantable Lead Implant Date: 20230424
Implantable Lead Implant Date: 20230424
Implantable Lead Implant Date: 20230424
Implantable Lead Location: 753858
Implantable Lead Location: 753859
Implantable Lead Location: 753860
Implantable Pulse Generator Implant Date: 20230424
Lead Channel Impedance Value: 400 Ohm
Lead Channel Impedance Value: 462.5 Ohm
Lead Channel Impedance Value: 587.5 Ohm
Lead Channel Pacing Threshold Amplitude: 0.75 V
Lead Channel Pacing Threshold Amplitude: 0.75 V
Lead Channel Pacing Threshold Amplitude: 1.5 V
Lead Channel Pacing Threshold Pulse Width: 0.5 ms
Lead Channel Pacing Threshold Pulse Width: 0.5 ms
Lead Channel Pacing Threshold Pulse Width: 0.8 ms
Lead Channel Sensing Intrinsic Amplitude: 10.5 mV
Lead Channel Sensing Intrinsic Amplitude: 5 mV
Lead Channel Setting Pacing Amplitude: 3.5 V
Lead Channel Setting Pacing Amplitude: 3.5 V
Lead Channel Setting Pacing Amplitude: 3.5 V
Lead Channel Setting Pacing Pulse Width: 0.5 ms
Lead Channel Setting Pacing Pulse Width: 0.8 ms
Lead Channel Setting Sensing Sensitivity: 0.5 mV
Pulse Gen Serial Number: 210001616

## 2022-04-25 NOTE — Progress Notes (Signed)
Patient advised of CXR results and advisement that no other testing is needed. Will continue to monitor remote transmissions.  ?

## 2022-04-25 NOTE — Progress Notes (Signed)
Wound check appointment s/p CRT-D implant 04/09/22 for ICM. Steri-strips removed. Wound without redness or edema. Incision edges approximated, wound well healed. Normal device function. RA/RV thresholds, sensing, and impedances consistent with implant measurements. LV threshold resulted at 3.75/0.65ms. Multivectors tested with phrinic noted 1-4 and 1-2. Best threshold obtained 2-coil with pulse width extended to 0.30ms (1.5@0 .8). Discussed with Dr. Lalla Brothers prior to reprogramming. Continues to BVP 98%, LV before RV. CXR obtained. Device programmed at 3.5V for extra safety margin until 3 month visit. Histogram distribution appropriate for patient and level of activity. No mode switches or ventricular arrhythmias noted. Patient educated about wound care, arm mobility, lifting restrictions, shock plan. Patient enrolled in remote monitoring with next transmission scheduled 07/09/22. 91 day follow up with Dr. Lalla Brothers 07/12/22.  ?

## 2022-04-29 NOTE — Progress Notes (Signed)
? ?ADVANCED HF CLINIC NOTE ? ?Referring Physician: Dr. Herbie Baltimore ?Primary Care: Daisy Floro, MD ?Primary Cardiologist: Dr. Herbie Baltimore ?HF Cardiologist: Dr. Gala Romney ? ?HPI: ? ?[name] is a 66 year old with CAD s/p CABG 2020, DM2, HL, ADHD previously on adderall,  LBBB and systolic HF due to iCM.   ?  ?Admitted 8/20 with NSTEMI. LHC with severe 2v CAD with totalled LAD. LVEF at that time was 35 to 45%.  He underwent CABG with LIMA to LAD, SVG to PDA, SVG to PL 08/13/2019 with Dr. Cliffton Asters.  ?  ?In 2021 EF was up to 45-50%.  ?  ?Admitted with 12/22 with ADHF. Cath with severe 2V CAD with CTO of proximal LAD and mild RCA. LIMA patent.  Chronically occluded SVG-rPDA and SVG-rPL. ECHO EF 20-25%  ?  ?Underwent CRT-D implant 4/23 ? ?Here with his wife for routine f/u. Feeling good. Thinks device may be helping. Site healing well. Not overly active. Denies CP or SOB. No edema, orthopnea or PND.  ? ?  ?Cardiac Testing  ?Echo 11/2021 EF 20-25% RV moderately reduced  ?Echo 1/20221 EF 45-50%  ?  ?RHC/LHC 11/20/21 ?Severe two-vessel coronary artery disease with chronic total occlusions of proximal LAD and mid RCA.  There is also moderate diffuse disease of the distal LCx. ?Widely patent LIMA-D3, which backfills the mid and distal LAD. ?Chronically occluded SVG-rPDA and SVG-rPL. ?Mildly-moderately elevated left heart filling pressure. ?Normal right heart filling pressure. ?Mildly reduced Fick cardiac output.  ?Recommendations: ?Continue aggressive secondary prevention of coronary artery disease. ?Gentle diuresis. ?Escalate goal-directed medical therapy of HFrEF. ? ?Past Medical History:  ?Diagnosis Date  ? CHF (congestive heart failure) (HCC)   ? Chronic HFrEF (heart failure with reduced ejection fraction) (HCC) 11/17/2021  ? EF down to 20-25% with Acute Presentation of HFrEF - Global & LAD territory HK.  ? Coronary artery disease involving coronary bypass graft of native heart 08/12/2019  ? SVG-rPDA & SVG-RPL 100% occluded.   ? HLD (hyperlipidemia)   ? Hyperlipidemia associated with type 2 diabetes mellitus (HCC) 11/30/2019  ? Ischemic cardiomyopathy 11/30/2019  ? Post NSTEMI-CABG, EF increased from 35% to 45% => 11/2021 Acute on Chronic HFrEF -> EF downt to 20-25%. Cath with both SVGs to RPDA & PL along with native RCA CTO.  Patent LIMA-D3(LAD).  L-RPDA collaterals.  = titration of meds limited by Hypotension.  ? Multivessel CAD - 100% LAD (with Diag), 99% mRCA (subtotal occlusio) 08/12/2019  ? NSTEMI (non-ST elevated myocardial infarction) (HCC) 08/10/2019  ? S/P Off Pump CABG x 3 08/13/2019  ? LIMA to LAD RSVG to PDA RSVG to PLVB  ? ?Current Outpatient Medications  ?Medication Sig Dispense Refill  ? acetaminophen (TYLENOL) 500 MG tablet Take 500-1,000 mg by mouth every 8 (eight) hours as needed for mild pain.    ? ALPRAZolam (XANAX) 0.25 MG tablet Take 0.25 mg by mouth daily as needed for anxiety.    ? amphetamine-dextroamphetamine (ADDERALL) 20 MG tablet Take 10 mg by mouth 4 (four) times daily as needed (focus).    ? aspirin EC 81 MG tablet Take 81 mg by mouth daily.    ? B Complex Vitamins (B-COMPLEX/B-12 PO) Take 500 mg by mouth daily.    ? carvedilol (COREG) 6.25 MG tablet Take 1 tablet (6.25 mg total) by mouth 2 (two) times daily with a meal. 60 tablet 5  ? Cholecalciferol (VITAMIN D3) 50 MCG (2000 UT) capsule Take 2,000 Units by mouth daily.    ? dapagliflozin propanediol (  FARXIGA) 10 MG TABS tablet Take 1 tablet (10 mg total) by mouth daily. 30 tablet 5  ? Evolocumab (REPATHA SURECLICK) 140 MG/ML SOAJ Inject 140 mg into the skin every 14 (fourteen) days. 6 mL 3  ? glucose blood test strip TEST FOUR TIMES DAILY OR AS DIRECTED 100 each 1  ? Lancets (ONETOUCH ULTRASOFT) lancets Use as instructed 100 each 1  ? MAGNESIUM PO Take 250 mg by mouth daily.    ? Melatonin 10 MG CAPS Take 10 mg by mouth at bedtime as needed (sleep).    ? Menaquinone-7 (K2 PO) Take 100 mg by mouth daily.    ? Multiple Vitamin (MULTI-VITAMIN) tablet Take 1  tablet by mouth daily.    ? niacin 500 MG tablet Take 1,000 mg by mouth at bedtime.    ? Potassium 99 MG TABS Patient takes 1/2 tablet by mouth once a day.    ? sacubitril-valsartan (ENTRESTO) 24-26 MG Take 1 tablet by mouth 2 (two) times daily. 60 tablet 5  ? terbinafine (LAMISIL) 250 MG tablet Take 250 mg by mouth See admin instructions. 250 mg oral daily for 7 days every month, last week of the month    ? torsemide (DEMADEX) 20 MG tablet Take 20mg  daily alternating with 40mg  daily (Patient taking differently: Take 20-40 mg by mouth See admin instructions. Take 20mg  daily alternating with 40mg  daily) 90 tablet 3  ? ?No current facility-administered medications for this encounter.  ? ?Allergies  ?Allergen Reactions  ? Atorvastatin Other (See Comments)  ?  Memory deficit, fatigue, myalgias, insomnia; similar symptoms with rosuvastatin.  ? Icosapent Ethyl   ?  lethargy  ? Rosuvastatin Other (See Comments)  ?  Lethargic, fatigue  ? ?Social History  ? ?Socioeconomic History  ? Marital status: Married  ?  Spouse name: Not on file  ? Number of children: Not on file  ? Years of education: Not on file  ? Highest education level: Not on file  ?Occupational History  ? Not on file  ?Tobacco Use  ? Smoking status: Former  ?  Types: Cigars  ? Smokeless tobacco: Never  ?Vaping Use  ? Vaping Use: Never used  ?Substance and Sexual Activity  ? Alcohol use: Not Currently  ?  Comment: not in the past 6 months  ? Drug use: Not Currently  ? Sexual activity: Not on file  ?Other Topics Concern  ? Not on file  ?Social History Narrative  ? Not on file  ? ?Social Determinants of Health  ? ?Financial Resource Strain: Low Risk   ? Difficulty of Paying Living Expenses: Not very hard  ?Food Insecurity: No Food Insecurity  ? Worried About Programme researcher, broadcasting/film/videounning Out of Food in the Last Year: Never true  ? Ran Out of Food in the Last Year: Never true  ?Transportation Needs: No Transportation Needs  ? Lack of Transportation (Medical): No  ? Lack of Transportation  (Non-Medical): No  ?Physical Activity: Not on file  ?Stress: Not on file  ?Social Connections: Not on file  ?Intimate Partner Violence: Not on file  ? ? No family history on file. ? ?BP 118/70   Pulse 60   Wt 93.1 kg (205 lb 3.2 oz)   SpO2 97%   BMI 29.44 kg/m?  ? ?Wt Readings from Last 3 Encounters:  ?04/30/22 93.1 kg (205 lb 3.2 oz)  ?04/09/22 92.1 kg (203 lb)  ?03/23/22 93.4 kg (205 lb 12.8 oz)  ? ?PHYSICAL EXAM: ?General:  Well appearing. No resp difficulty ?  HEENT: normal ?Neck: supple. no JVD. Carotids 2+ bilat; no bruits. No lymphadenopathy or thryomegaly appreciated. ?Cor: PMI nondisplaced. Regular rate & rhythm. No rubs, gallops or murmurs. ICD site looks good ?Lungs: clear ?Abdomen: soft, nontender, nondistended. No hepatosplenomegaly. No bruits or masses. Good bowel sounds. ?Extremities: no cyanosis, clubbing, rash, edema ?Neuro: alert & orientedx3, cranial nerves grossly intact. moves all 4 extremities w/o difficulty. Affect pleasant ? ?ECG: AV paced 60 QRS Personally reviewed ? ?ICD interrogated in clinic. No VT. >99% BiVpacing Personally reviewed ? ?ASSESSMENT & PLAN: ? ?1. Chronic HFrEF due to iCM +/- LBBB ?- Echo 2021 EF 45-50%.  ?- Echo 12/22 EF 20-25% ?- Wide LBBB on ECG s/p STJ CTR-D 4/23 with Dr. Lalla Brothers ?- NYHA II. Not very active.  ?- Volume status ok ?- Continue torsemide 20 or 40mg  daily. Can cut back as needed ?- Continue carvedilol 6.25 mg bid --> He prefers to not increase due to ED.  ?- Continue Entresto 24-26 mg bid. ?- Does not want spiro or eplerenone due to gynecomastia. No change ?- Continue Farxiga 10 mg daily. ?- Labs today ?- F/u echo in 2-3 months to assess for LV recovery  ? ?2. LBBB ?- Suspect LBBB CM. ?- QRS 174 ms pre CRT. today ?- s/p STJ CRT-D 4/23 with Dr. 5/23 ?  ?3.CAD ?- S/P CABG 2021 ?- LHC 11/2021 severe 2V CAD with CTO of proximal LAD and mild RCA . LIMA patent. + chronically occluded SVG-rPDA and SVG-rPL. ?- Intolerant statin. On Repatha.  ?-  Continue ASA+ beta blocker.  ?- No s/s angina ?- Has completed CR ?  ?4. DMII ?- Continue Farxiga ?- Consider 12/2021. ?  ?5. ADHD ?- OK to continue Adderall, he only uses ~ 3 x week and is not tachycardic ?

## 2022-04-30 ENCOUNTER — Ambulatory Visit (HOSPITAL_COMMUNITY)
Admission: RE | Admit: 2022-04-30 | Discharge: 2022-04-30 | Disposition: A | Discharge status: Home / Self Care | Payer: Medicare Other | Source: Ambulatory Visit | Attending: Internal Medicine | Admitting: Internal Medicine

## 2022-04-30 ENCOUNTER — Encounter: Payer: Self-pay | Admitting: Internal Medicine

## 2022-04-30 ENCOUNTER — Encounter (HOSPITAL_COMMUNITY): Payer: Self-pay | Admitting: Internal Medicine

## 2022-04-30 VITALS — BP 118/70 | HR 60 | Wt 205.2 lb

## 2022-04-30 DIAGNOSIS — I25810 Atherosclerosis of coronary artery bypass graft(s) without angina pectoris: Secondary | ICD-10-CM | POA: Insufficient documentation

## 2022-04-30 DIAGNOSIS — Z79899 Other long term (current) drug therapy: Secondary | ICD-10-CM | POA: Insufficient documentation

## 2022-04-30 DIAGNOSIS — I5022 Chronic systolic (congestive) heart failure: Secondary | ICD-10-CM

## 2022-04-30 DIAGNOSIS — I255 Ischemic cardiomyopathy: Secondary | ICD-10-CM | POA: Insufficient documentation

## 2022-04-30 DIAGNOSIS — I447 Left bundle-branch block, unspecified: Secondary | ICD-10-CM | POA: Diagnosis not present

## 2022-04-30 DIAGNOSIS — F909 Attention-deficit hyperactivity disorder, unspecified type: Secondary | ICD-10-CM | POA: Diagnosis not present

## 2022-04-30 DIAGNOSIS — I252 Old myocardial infarction: Secondary | ICD-10-CM | POA: Diagnosis not present

## 2022-04-30 DIAGNOSIS — Z95810 Presence of automatic (implantable) cardiac defibrillator: Secondary | ICD-10-CM | POA: Insufficient documentation

## 2022-04-30 DIAGNOSIS — R0683 Snoring: Secondary | ICD-10-CM | POA: Insufficient documentation

## 2022-04-30 DIAGNOSIS — E119 Type 2 diabetes mellitus without complications: Secondary | ICD-10-CM | POA: Insufficient documentation

## 2022-04-30 DIAGNOSIS — I25118 Atherosclerotic heart disease of native coronary artery with other forms of angina pectoris: Secondary | ICD-10-CM | POA: Diagnosis not present

## 2022-04-30 DIAGNOSIS — I2510 Atherosclerotic heart disease of native coronary artery without angina pectoris: Secondary | ICD-10-CM | POA: Diagnosis not present

## 2022-04-30 LAB — BASIC METABOLIC PANEL
Anion gap: 9 (ref 5–15)
BUN: 23 mg/dL (ref 8–23)
CO2: 25 mmol/L (ref 22–32)
Calcium: 9.7 mg/dL (ref 8.9–10.3)
Chloride: 101 mmol/L (ref 98–111)
Creatinine, Ser: 1.12 mg/dL (ref 0.61–1.24)
GFR, Estimated: >60 mL/min (ref >60)
Glucose, Bld: 93 mg/dL (ref 70–99)
Potassium: 3.9 mmol/L (ref 3.5–5.1)
Sodium: 135 mmol/L (ref 135–145)

## 2022-04-30 LAB — BRAIN NATRIURETIC PEPTIDE: B Natriuretic Peptide: 135.1 pg/mL — ABNORMAL HIGH (ref 0.0–100.0)

## 2022-04-30 NOTE — Patient Instructions (Signed)
Medication Changes: ? ?None, continue current medications ? ?Lab Work: ? ?Labs done today, your results will be available in MyChart, we will contact you for abnormal readings. ? ? ?Testing/Procedures: ? ?Your physician has requested that you have an echocardiogram. Echocardiography is a painless test that uses sound waves to create images of your heart. It provides your doctor with information about the size and shape of your heart and how well your heart?s chambers and valves are working. This procedure takes approximately one hour. There are no restrictions for this procedure. IN 3 MONTHS ? ?Referrals: ? ?None ? ?Special Instructions // Education: ? ?Do the following things EVERYDAY: ?Weigh yourself in the morning before breakfast. Write it down and keep it in a log. ?Take your medicines as prescribed ?Eat low salt foods--Limit salt (sodium) to 2000 mg per day.  ?Stay as active as you can everyday ?Limit all fluids for the day to less than 2 liters ? ? ?Follow-Up in: 3 months with echocardiogram ? ?At the [facility], you and your health needs are our priority. We have a designated team specialized in the treatment of Heart Failure. This Care Team includes your primary Heart Failure Specialized Cardiologist (physician), Advanced Practice Providers (APPs- Physician Assistants and Nurse Practitioners), and Pharmacist who all work together to provide you with the care you need, when you need it.  ? ?You may see any of the following providers on your designated Care Team at your next follow up: ? ?Dr Arvilla Meres ?Dr Marca Ancona ?Tonye Becket, NP ?Robbie Lis, PA ?Jessica Milford,NP ?Anna Genre, PA ?Karle Plumber, PharmD ? ? ?Please be sure to bring in all your medications bottles to every appointment.  ? ?Need to Contact us: ? ?If you have any questions or concerns before your next appointment please send [place] a message through [place] or call our office at [phone].   ? ?TO LEAVE A  MESSAGE FOR THE NURSE SELECT OPTION 2, PLEASE LEAVE A MESSAGE INCLUDING: ?YOUR NAME ?DATE OF BIRTH ?CALL BACK NUMBER ?REASON FOR CALL**this is important as we prioritize the call backs ? ?YOU WILL RECEIVE A CALL BACK THE SAME DAY AS LONG AS YOU CALL BEFORE 4:00 PM ? ? ?

## 2022-05-04 ENCOUNTER — Telehealth: Payer: Self-pay | Admitting: Cardiology

## 2022-05-04 DIAGNOSIS — I5022 Chronic systolic (congestive) heart failure: Secondary | ICD-10-CM

## 2022-05-04 MED ORDER — TORSEMIDE 20 MG PO TABS: ORAL_TABLET | ORAL | 1 refills | Status: DC

## 2022-05-04 NOTE — Telephone Encounter (Signed)
Can you guys assist, maybe I am missing something, but I am unsure of dose. Last OV states continue 20 mg or 40 mg daily.   Thanks!

## 2022-05-04 NOTE — Telephone Encounter (Signed)
Pt c/o medication issue:  1. Name of Medication:   torsemide (DEMADEX) 20 MG tablet   2. How are you currently taking this medication (dosage and times per day)?   Take 20mg  daily alternating with 40mg  dailyPatient taking differently: Take 20-40 mg by mouth See admin instructions. Take 20mg  daily alternating with 40mg  daily      3. Are you having a reaction (difficulty breathing--STAT)? No  4. What is your medication issue? Pharmacy stating that they are needing updated instructions for mediation.   Pharmacy is wanting to send medication out to pt today, would like if this can be addressed soon as possible.

## 2022-06-06 ENCOUNTER — Ambulatory Visit: Payer: Medicare Other | Admitting: Cardiology

## 2022-06-17 ENCOUNTER — Other Ambulatory Visit (HOSPITAL_COMMUNITY): Payer: Self-pay | Admitting: Internal Medicine

## 2022-06-29 NOTE — Addendum Note (Signed)
Encounter addended by: Novella Olive on: 06/29/2022 3:38 PM  Actions taken: Letter saved

## 2022-07-02 DIAGNOSIS — E7800 Pure hypercholesterolemia, unspecified: Secondary | ICD-10-CM | POA: Diagnosis not present

## 2022-07-02 DIAGNOSIS — E1169 Type 2 diabetes mellitus with other specified complication: Secondary | ICD-10-CM | POA: Diagnosis not present

## 2022-07-02 DIAGNOSIS — I10 Essential (primary) hypertension: Secondary | ICD-10-CM | POA: Diagnosis not present

## 2022-07-02 DIAGNOSIS — I5022 Chronic systolic (congestive) heart failure: Secondary | ICD-10-CM | POA: Diagnosis not present

## 2022-07-05 ENCOUNTER — Other Ambulatory Visit (HOSPITAL_COMMUNITY): Payer: Self-pay

## 2022-07-05 MED ORDER — ENTRESTO 24-26 MG PO TABS
1.0000 | ORAL_TABLET | Freq: Two times a day (BID) | ORAL | 5 refills | Status: DC
Start: 2022-07-05 — End: 2022-12-20

## 2022-07-09 ENCOUNTER — Ambulatory Visit (INDEPENDENT_AMBULATORY_CARE_PROVIDER_SITE_OTHER): Payer: Medicare Other

## 2022-07-09 ENCOUNTER — Other Ambulatory Visit: Payer: Self-pay | Admitting: Family Medicine

## 2022-07-09 ENCOUNTER — Other Ambulatory Visit (HOSPITAL_COMMUNITY): Payer: Self-pay | Admitting: Family Medicine

## 2022-07-09 DIAGNOSIS — E041 Nontoxic single thyroid nodule: Secondary | ICD-10-CM | POA: Diagnosis not present

## 2022-07-09 DIAGNOSIS — E1169 Type 2 diabetes mellitus with other specified complication: Secondary | ICD-10-CM | POA: Diagnosis not present

## 2022-07-09 DIAGNOSIS — R946 Abnormal results of thyroid function studies: Secondary | ICD-10-CM | POA: Diagnosis not present

## 2022-07-09 DIAGNOSIS — R9389 Abnormal findings on diagnostic imaging of other specified body structures: Secondary | ICD-10-CM

## 2022-07-09 DIAGNOSIS — I255 Ischemic cardiomyopathy: Secondary | ICD-10-CM

## 2022-07-10 LAB — CUP PACEART REMOTE DEVICE CHECK
Battery Remaining Longevity: 48 mo
Battery Remaining Percentage: 93 %
Battery Voltage: 2.96 V
Brady Statistic AP VP Percent: 21 %
Brady Statistic AP VS Percent: 1 %
Brady Statistic AS VP Percent: 77 %
Brady Statistic AS VS Percent: 1.5 %
Brady Statistic RA Percent Paced: 21 %
Date Time Interrogation Session: 20230724020117
HighPow Impedance: 64 Ohm
Implantable Lead Implant Date: 20230424
Implantable Lead Implant Date: 20230424
Implantable Lead Implant Date: 20230424
Implantable Lead Location: 753858
Implantable Lead Location: 753859
Implantable Lead Location: 753860
Implantable Pulse Generator Implant Date: 20230424
Lead Channel Impedance Value: 430 Ohm
Lead Channel Impedance Value: 510 Ohm
Lead Channel Impedance Value: 540 Ohm
Lead Channel Pacing Threshold Amplitude: 0.75 V
Lead Channel Pacing Threshold Amplitude: 0.75 V
Lead Channel Pacing Threshold Amplitude: 1.5 V
Lead Channel Pacing Threshold Pulse Width: 0.5 ms
Lead Channel Pacing Threshold Pulse Width: 0.5 ms
Lead Channel Pacing Threshold Pulse Width: 0.8 ms
Lead Channel Sensing Intrinsic Amplitude: 11.8 mV
Lead Channel Sensing Intrinsic Amplitude: 5 mV
Lead Channel Setting Pacing Amplitude: 3.5 V
Lead Channel Setting Pacing Amplitude: 3.5 V
Lead Channel Setting Pacing Amplitude: 3.5 V
Lead Channel Setting Pacing Pulse Width: 0.5 ms
Lead Channel Setting Pacing Pulse Width: 0.8 ms
Lead Channel Setting Sensing Sensitivity: 0.5 mV
Pulse Gen Serial Number: 210001616

## 2022-07-12 ENCOUNTER — Ambulatory Visit: Payer: Medicare Other | Admitting: Cardiology

## 2022-07-12 ENCOUNTER — Encounter: Payer: Self-pay | Admitting: Cardiology

## 2022-07-12 VITALS — BP 118/74 | HR 63 | Ht 70.0 in | Wt 204.0 lb

## 2022-07-12 DIAGNOSIS — I5022 Chronic systolic (congestive) heart failure: Secondary | ICD-10-CM | POA: Diagnosis not present

## 2022-07-12 DIAGNOSIS — I447 Left bundle-branch block, unspecified: Secondary | ICD-10-CM

## 2022-07-12 DIAGNOSIS — Z95810 Presence of automatic (implantable) cardiac defibrillator: Secondary | ICD-10-CM

## 2022-07-12 NOTE — Patient Instructions (Signed)
Medication Instructions:  Your physician recommends that you continue on your current medications as directed. Please refer to the Current Medication list given to you today. *If you need a refill on your cardiac medications before your next appointment, please call your pharmacy*  Lab Work: None. If you have labs (blood work) drawn today and your tests are completely normal, you will receive your results only by: MyChart Message (if you have MyChart) OR A paper copy in the mail If you have any lab test that is abnormal or we need to change your treatment, we will call you to review the results.  Testing/Procedures: None.  Follow-Up: At [facility], you and your health needs are our priority.  As part of our continuing mission to provide you with exceptional heart care, we have created designated Provider Care Teams.  These Care Teams include your primary Cardiologist (physician) and Advanced Practice Providers (APPs -  Physician Assistants and Nurse Practitioners) who all work together to provide you with the care you need, when you need it.  Your physician wants you to follow-up in: 12 months with Cameron Lambert, MD     You will receive a reminder letter in the mail two months in advance. If you don't receive a letter, please call our office to schedule the follow-up appointment.  We recommend signing up for the patient portal called "MyChart".  Sign up information is provided on this After Visit Summary.  MyChart is used to connect with patients for Virtual Visits (Telemedicine).  Patients are able to view lab/test results, encounter notes, upcoming appointments, etc.  Non-urgent messages can be sent to your provider as well.   To learn more about what you can do with MyChart, go to [email or web address].    Any Other Special Instructions Will Be Listed Below (If Applicable).         

## 2022-07-12 NOTE — Progress Notes (Signed)
Electrophysiology Office Follow up Visit Note:    Date:  07/12/2022   ID:  Anthony Alvarez, DOB 06-23-1956, MRN [id number]  PCP:  Daisy Floro, MD  [facility] Cardiologist:  [name], MD  [facility] Electrophysiologist:  None    Interval History:    Anthony Alvarez is a 66 y.o. male who presents for a follow up visit of their CRT-D. They were last seen in clinic 03/23/2022.  Since their last appointment, he underwent CRT-D insertion on 04/09/2022.  Today, he states that he sometimes feels a little lethargic; it is difficult for him to get started before noon. His wife notes that he is feeling great and the color has returned to his skin.  For a few days he noted redness surrounding his incisional site, but this improved quickly.  Recently he was able to install a new washer/dryer.  He denies any fluid retention or significant weight gain. He has been trying to follow a low carb diet to also help with managing his diabetes.  Usually he does lie flat on the bed.  They deny any palpitations, chest pain, shortness of breath, or peripheral edema. No lightheadedness, headaches, syncope, orthopnea, or PND.      Past Medical History:  Diagnosis Date   CHF (congestive heart failure) (HCC)    Chronic HFrEF (heart failure with reduced ejection fraction) (HCC) 11/17/2021   EF down to 20-25% with Acute Presentation of HFrEF - Global & LAD territory HK.   Coronary artery disease involving coronary bypass graft of native heart 08/12/2019   SVG-rPDA & SVG-RPL 100% occluded.   HLD (hyperlipidemia)    Hyperlipidemia associated with type 2 diabetes mellitus (HCC) 11/30/2019   Ischemic cardiomyopathy 11/30/2019   Post NSTEMI-CABG, EF increased from 35% to 45% => 11/2021 Acute on Chronic HFrEF -> EF downt to 20-25%. Cath with both SVGs to RPDA & PL along with native RCA CTO.  Patent LIMA-D3(LAD).  L-RPDA collaterals.  = titration of meds limited by Hypotension.   Multivessel CAD -  100% LAD (with Diag), 99% mRCA (subtotal occlusio) 08/12/2019   NSTEMI (non-ST elevated myocardial infarction) (HCC) 08/10/2019   S/P Off Pump CABG x 3 08/13/2019   LIMA to LAD RSVG to PDA RSVG to PLVB    Past Surgical History:  Procedure Laterality Date   BIV ICD INSERTION CRT-D N/A 04/09/2022   Procedure: BIV ICD INSERTION CRT-D;  Surgeon: Lanier Prude, MD;  Location: [facility] INVASIVE CV LAB;  Service: Cardiovascular;  Laterality: N/A;   CARDIAC CATHETERIZATION     CORONARY ARTERY BYPASS GRAFT N/A 08/13/2019   Procedure: OFF PUMP CORONARY ARTERY BYPASS GRAFTING (CABG) x 2 WITH ENDOSCOPIC HARVESTING OF RIGHT GREATER SAPHENOUS VEIN;  Surgeon: Corliss Skains, MD;  Location: [facility] OR;  Service: Open Heart Surgery: LIMA-LAD, SVG-PDA, SVG-PL.   LEFT HEART CATH AND CORONARY ANGIOGRAPHY N/A 08/11/2019   Procedure: LEFT HEART CATH AND CORONARY ANGIOGRAPHY;  Surgeon: [place], [name], MD;  Location: [facility] INVASIVE CV LAB;  Service: Cardiovascular;  p-mLAD 100%, mRCA thrombotic 95%, rPAV 95%.  EF mod reduced, ~40%. EDP 24 mmHg --> CABG referral   RIGHT/LEFT HEART CATH AND CORONARY/GRAFT ANGIOGRAPHY N/A 11/20/2021   Procedure: RIGHT/LEFT HEART CATH AND CORONARY/GRAFT ANGIOGRAPHY;  Surgeon: Yvonne Kendall, MD;  Location: [facility] INVASIVE CV LAB;; Severe 2 V CAD - CTO of pLAD & mRCA. RPAD 95%. Mod (~50) distal LCx disease.  LIMA-D3 (Intramyocardial LAD) patent - backfills LAD. CTO of SVG-rPDA & SVG-RPL. Mild-Moderately elevated LVEDP. Normal RHC Pressures.  Mildly reduced CO-CI by Fick.   TEE WITHOUT CARDIOVERSION N/A 08/13/2019   Procedure: TRANSESOPHAGEAL ECHOCARDIOGRAM (TEE);  Surgeon: Corliss Skains, MD;  Location: [facility] OR;  Service: Open Heart Surgery;;;EF 30-40%.  Apical akinesis.  Trace AI.  Mild MR.  Normal RV.    TRANSTHORACIC ECHOCARDIOGRAM  12/21/2019   EF improved to 45 and 50%.  Mild LVH.  Moderate HK of the mid anteroapical, inferoapical and inferolateral wall.  Septal motion consistent with  LBBB.  Only GR 1 DD.  Moderate reduced RV function.  Normal atrial size.  Aortic root roughly 39 mm   TRANSTHORACIC ECHOCARDIOGRAM  08/11/2019   (non-STEMI) EF 35%.  Apical septal akinesis, apical akinesis.  Mid apical inferior akinesis and apical lateral akinesis.  GRIII DD.  Trivial MR.  Mild aortic root dilation-4.2 mL.   TRANSTHORACIC ECHOCARDIOGRAM Right 11/18/2021   Acute CHF presentation (in setting of Flu-PNA) => Severe global HK.  Akinetic LAD territory.  EF 20 to 25%.  Moderate LV dilation.  GRII DD.  Moderate is RV function.  AOV sclerosis but no stenosis.  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  Trace AI.  Normal RAP.    Current Medications: Current Meds  Medication Sig   acetaminophen (TYLENOL) 500 MG tablet Take 500-1,000 mg by mouth every 8 (eight) hours as needed for mild pain.   ALPRAZolam (XANAX) 0.25 MG tablet Take 0.25 mg by mouth daily as needed for anxiety.   amphetamine-dextroamphetamine (ADDERALL) 20 MG tablet Take 10 mg by mouth 4 (four) times daily as needed (focus).   aspirin EC 81 MG tablet Take 81 mg by mouth daily.   carvedilol (COREG) 6.25 MG tablet TAKE ONE TABLET BY MOUTH TWICE DAILY with A meal   Cholecalciferol (VITAMIN D3) 50 MCG (2000 UT) capsule Take 2,000 Units by mouth daily.   Evolocumab (REPATHA SURECLICK) 140 MG/ML SOAJ Inject 140 mg into the skin every 14 (fourteen) days.   FARXIGA 10 MG TABS tablet TAKE ONE TABLET BY MOUTH ONCE daily   MAGNESIUM PO Take 250 mg by mouth daily.   Melatonin 10 MG CAPS Take 10 mg by mouth at bedtime as needed (sleep).   Menaquinone-7 (K2 PO) Take 100 mg by mouth daily.   Multiple Vitamin (MULTI-VITAMIN) tablet Take 1 tablet by mouth daily.   Potassium 99 MG TABS Patient takes 1/2 tablet by mouth once a day.   sacubitril-valsartan (ENTRESTO) 24-26 MG Take 1 tablet by mouth 2 (two) times daily.     Allergies:   Atorvastatin, Icosapent ethyl, and Rosuvastatin   Social History   Socioeconomic History   Marital status: Married     Spouse name: Not on file   Number of children: Not on file   Years of education: Not on file   Highest education level: Not on file  Occupational History   Not on file  Tobacco Use   Smoking status: Former    Types: Cigars   Smokeless tobacco: Never  Vaping Use   Vaping Use: Never used  Substance and Sexual Activity   Alcohol use: Not Currently    Comment: not in the past 6 months   Drug use: Not Currently   Sexual activity: Not on file  Other Topics Concern   Not on file  Social History Narrative   Not on file   Social Determinants of Health   Financial Resource Strain: Low Risk  (11/27/2021)   Overall Financial Resource Strain (CARDIA)    Difficulty of Paying Living Expenses: Not very hard  Food Insecurity: No Food Insecurity (11/27/2021)   Hunger Vital Sign    Worried About Running Out of Food in the Last Year: Never true    Ran Out of Food in the Last Year: Never true  Transportation Needs: No Transportation Needs (11/27/2021)   PRAPARE - Administrator, Civil Service (Medical): No    Lack of Transportation (Non-Medical): No  Physical Activity: Not on file  Stress: Not on file  Social Connections: Not on file     Family History: The patient's family history is not on file.  ROS:   Please see the history of present illness.    All other systems reviewed and are negative.  EKGs/Labs/Other Studies Reviewed:    The following studies were reviewed today:  07/12/2022  In clinic device interrogation personally reviewed: Battery longevity 4.2 years Lead parameters stable Biventricular pacing 98% Atrial pacing 21% Magnet response on June 26 at 8:19 PM PMT episode June 4 at 4:54 AM   Cardiac MRI 03/01/2022: FINDINGS: Limited images of the lung fields showed no gross abnormalities. S/p sternotomy.   Dilated aortic root at sinuses of valsalva, 41 mm. Severely dilated left ventricle with normal wall thickness, LV EF 19%. Severe inferoseptal and  anteroseptal hypokinesis with septal-lateral dyssynchrony consistent with LBBB. Severely hypokinetic anterior wall. Akinetic inferior wall. Akinetic apex. Hypokinetic inferolateral wall. No LV thrombus noted. Normal right ventricular size with EF 36%. Normal left and right atrial sizes. No aortic stenosis or regurgitation noted. Minimal mitral regurgitation visualized.   DELAYED ENHANCEMENT IMAGING: DELAYED ENHANCEMENT IMAGING >50% wall thickness subendocardial late gadolinium enhancement (LGE) in the basal inferoseptal, inferior, and inferolateral walls.   >50% wall thickness subendocardial LGE in the mid inferior wall.   >50% wall thickness subendocardial LGE in the apical anterior wall and true apex.   MEASUREMENTS: MEASUREMENTS LVEDV 359 mL   LVSV 68 mL   LVEF 19%   RVEDV 145 mL RVSV 52 mL RVEF 36%   IMPRESSION: 1. Severely dilated LV with wall motion abnormalities noted above and septal-lateral dyssynchrony consistent with LBBB. LV EF 19%.   2.  Normal RV size with EF 36%.   3. LGE pattern as noted above. The basal inferoseptal, basal-mid inferior, basal inferolateral, and apical anterior wall segments as well as the true apex are significantly scarred with a myocardial inferction-type LGE pattern and unlikely to be viable with revascularization.   Right/Left Heart Cath 11/20/2021: Conclusions: Severe two-vessel coronary artery disease with chronic total occlusions of proximal LAD and mid RCA.  There is also moderate diffuse disease of the distal LCx. Widely patent LIMA-D3, which backfills the mid and distal LAD. Chronically occluded SVG-rPDA and SVG-rPL. Mildly-moderately elevated left heart filling pressure. Normal right heart filling pressure. Mildly reduced Fick cardiac output.   Recommendations: Continue aggressive secondary prevention of coronary artery disease. Gentle diuresis. Escalate goal-directed medical therapy of HFrEF.   Diagnostic Dominance:  Right  Echo 11/18/2021:  1. Severe global hypokinesis. LAD territory is akinetic. Left ventricular  ejection fraction, by estimation, is 20 to 25%. The left ventricle has  severely decreased function. The left ventricle demonstrates global  hypokinesis. The left ventricular  internal cavity size was moderately dilated. Left ventricular diastolic  parameters are consistent with Grade II diastolic dysfunction  (pseudonormalization).   2. Right ventricular systolic function is moderately reduced. The right  ventricular size is normal.   3. The mitral valve is normal in structure. Mild mitral valve  regurgitation.   4. The aortic valve  is grossly normal. Aortic valve regurgitation is  trivial. Aortic valve sclerosis/calcification is present, without any  evidence of aortic stenosis.   5. 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 Aortic dilatation  noted.   6. The inferior vena cava is normal in size with greater than 50%  respiratory variability, suggesting right atrial pressure of 3 mmHg.   Conclusion(s)/Recommendation(s): LV function severely globally hypokinetic  with LAD terriory akinetic worsened compared to prior echo 12/21/2019 . No  sigificant valvular disease.    EKG:  EKG is personally reviewed.   03/23/2022: Sinus rhythm, left bundle branch block with a QRS duration of 180 ms  Recent Labs: 11/18/2021: ALT 9; TSH 0.453 04/09/2022: Hemoglobin 15.6; Magnesium 2.1; Platelets 175 04/30/2022: B Natriuretic Peptide 135.1; BUN 23; Creatinine, Ser 1.12; Potassium 3.9; Sodium 135   Recent Lipid Panel    Component Value Date/Time   CHOL 75 11/19/2021 0555   CHOL 297 (H) 08/24/2021 1250   TRIG 134 11/19/2021 0555   HDL 30 (L) 11/19/2021 0555   HDL 28 (L) 08/24/2021 1250   CHOLHDL 2.5 11/19/2021 0555   VLDL 27 11/19/2021 0555   LDLCALC 18 11/19/2021 0555   LDLCALC 211 (H) 08/24/2021 1250    Physical Exam:    VS:  BP 118/74   Pulse 63   Ht 5\' 10"  (1.778 m)   Wt 204 lb  (92.5 kg)   BMI 29.27 kg/m     Wt Readings from Last 3 Encounters:  07/12/22 204 lb (92.5 kg)  04/30/22 205 lb 3.2 oz (93.1 kg)  04/09/22 203 lb (92.1 kg)     GEN: Well nourished, well developed in no acute distress HEENT: Normal NECK: No JVD; No carotid bruits LYMPHATICS: No lymphadenopathy CARDIAC: RRR, no murmurs, rubs, gallops; Device pocket well healed. RESPIRATORY:  Clear to auscultation without rales, wheezing or rhonchi  ABDOMEN: Soft, non-tender, non-distended MUSCULOSKELETAL:  No edema; No deformity  SKIN: Warm and dry NEUROLOGIC:  Alert and oriented x 3 PSYCHIATRIC:  Normal affect        ASSESSMENT:    1. Chronic HFrEF (heart failure with reduced ejection fraction) (HCC)   2. LBBB (left bundle branch block)   3. Cardiac resynchronization therapy defibrillator (CRT-D) in place    PLAN:    In order of problems listed above:  #Chronic systolic heart failure NYHA class II today.  Improved symptoms after CRT implant.  Continue Farxiga, [place], [place].  #CRT-D in situ Device functioning appropriately.  Continue remote monitoring.   Follow-up in 1 year.  Medication Adjustments/Labs and Tests Ordered: Current medicines are reviewed at length with the patient today.  Concerns regarding medicines are outlined above.  No orders of the defined types were placed in this encounter.  No orders of the defined types were placed in this encounter.   I,Mathew Stumpf,acting as a [place] for Neurosurgeon, MD.,have documented all relevant documentation on the behalf of Lanier Prude, MD,as directed by  Lanier Prude, MD while in the presence of Lanier Prude, MD.  I, Lanier Prude, MD, have reviewed all documentation for this visit. The documentation on 07/12/22 for the exam, diagnosis, procedures, and orders are all accurate and complete.   Signed, 07/14/22, MD, [facility], [facility] 07/12/2022 9:25 PM    Electrophysiology [facility]  HeartCare

## 2022-07-16 IMAGING — DX DG CHEST 2V
2 series · 2 of 2 positions shown · non-contrast
Comparison: April 10, 2022

CLINICAL DATA: Confirmed left ventricular lead placement.

EXAM:
CHEST - 2 VIEW

[dg chest 2 view (1 of 2)]
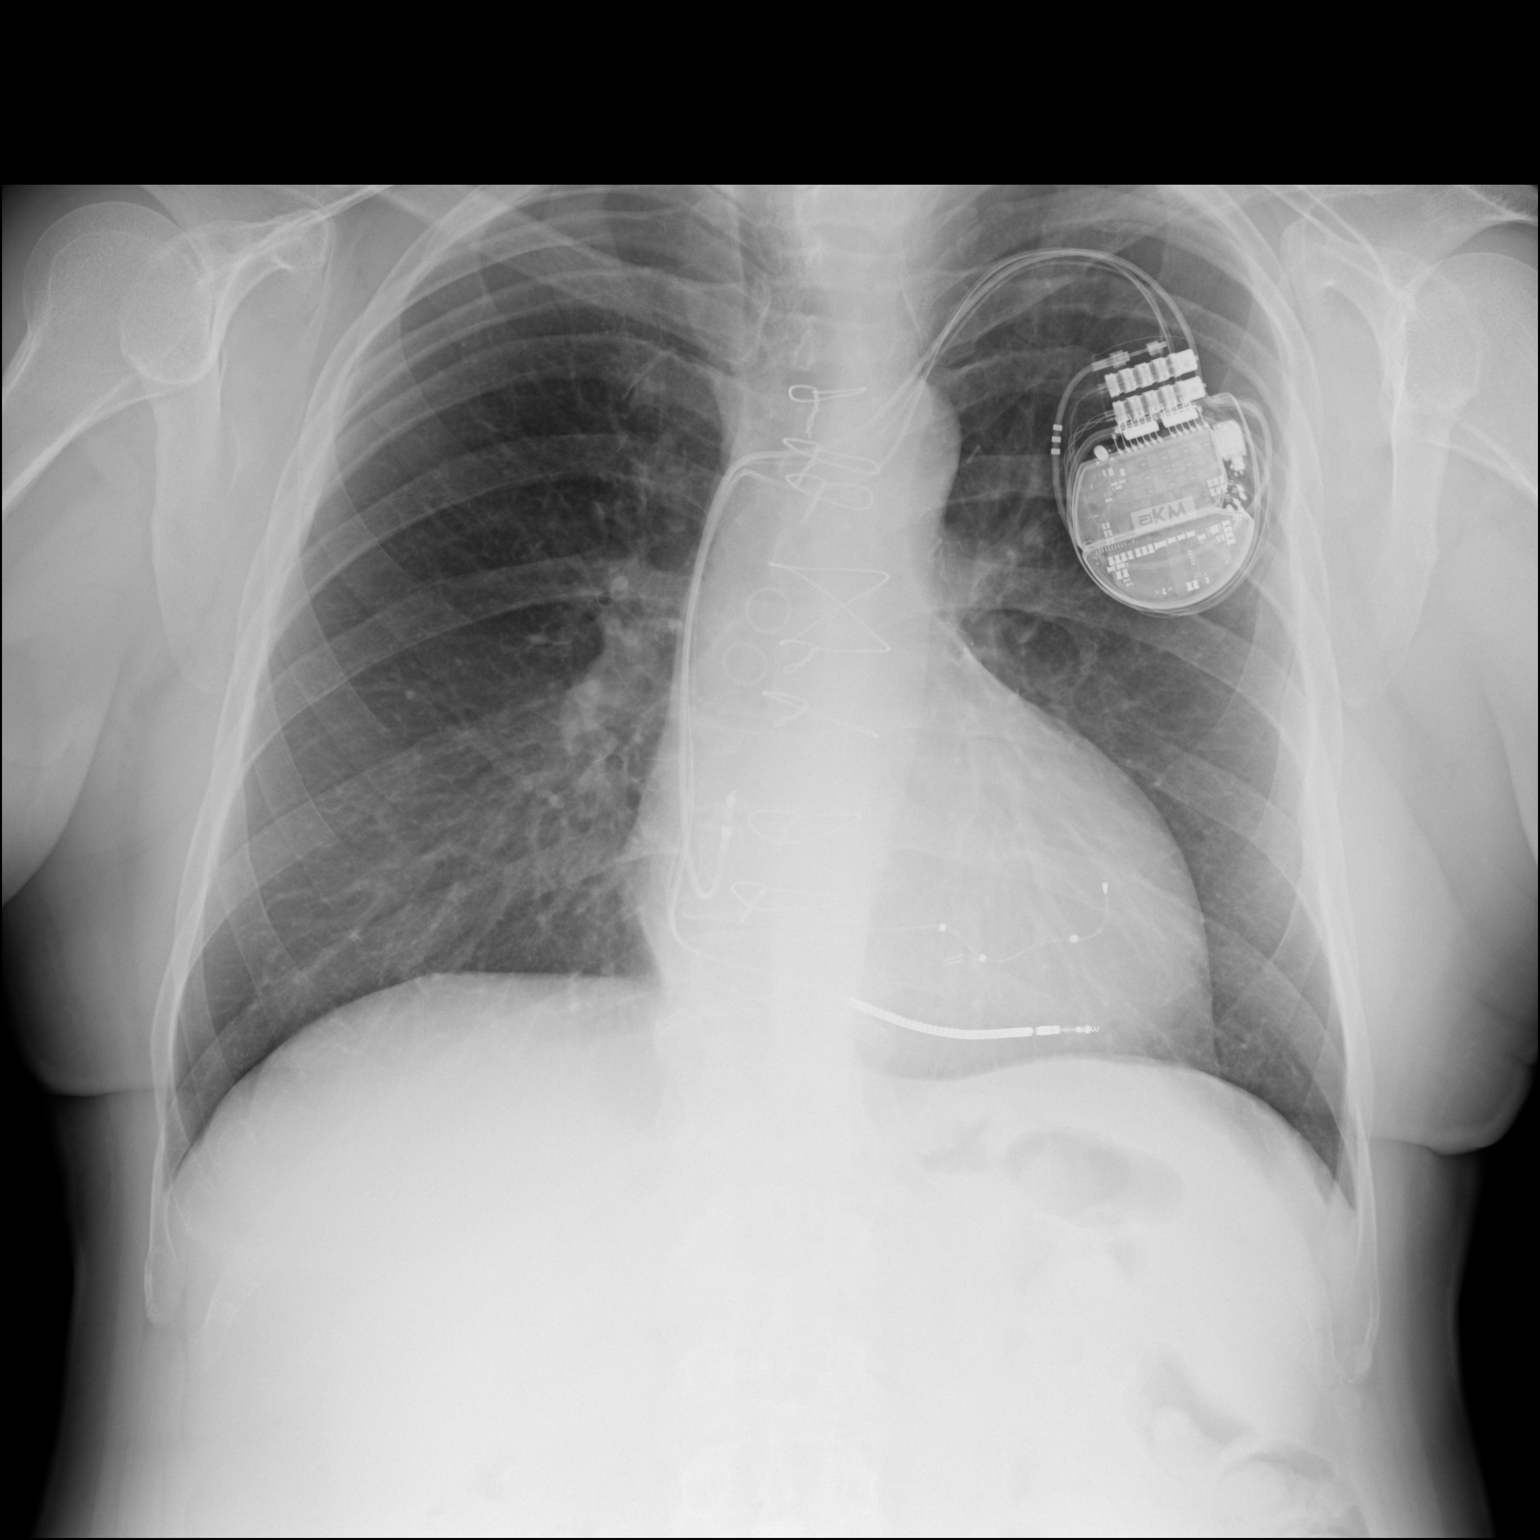

[dg chest 2 view (2 of 2)]
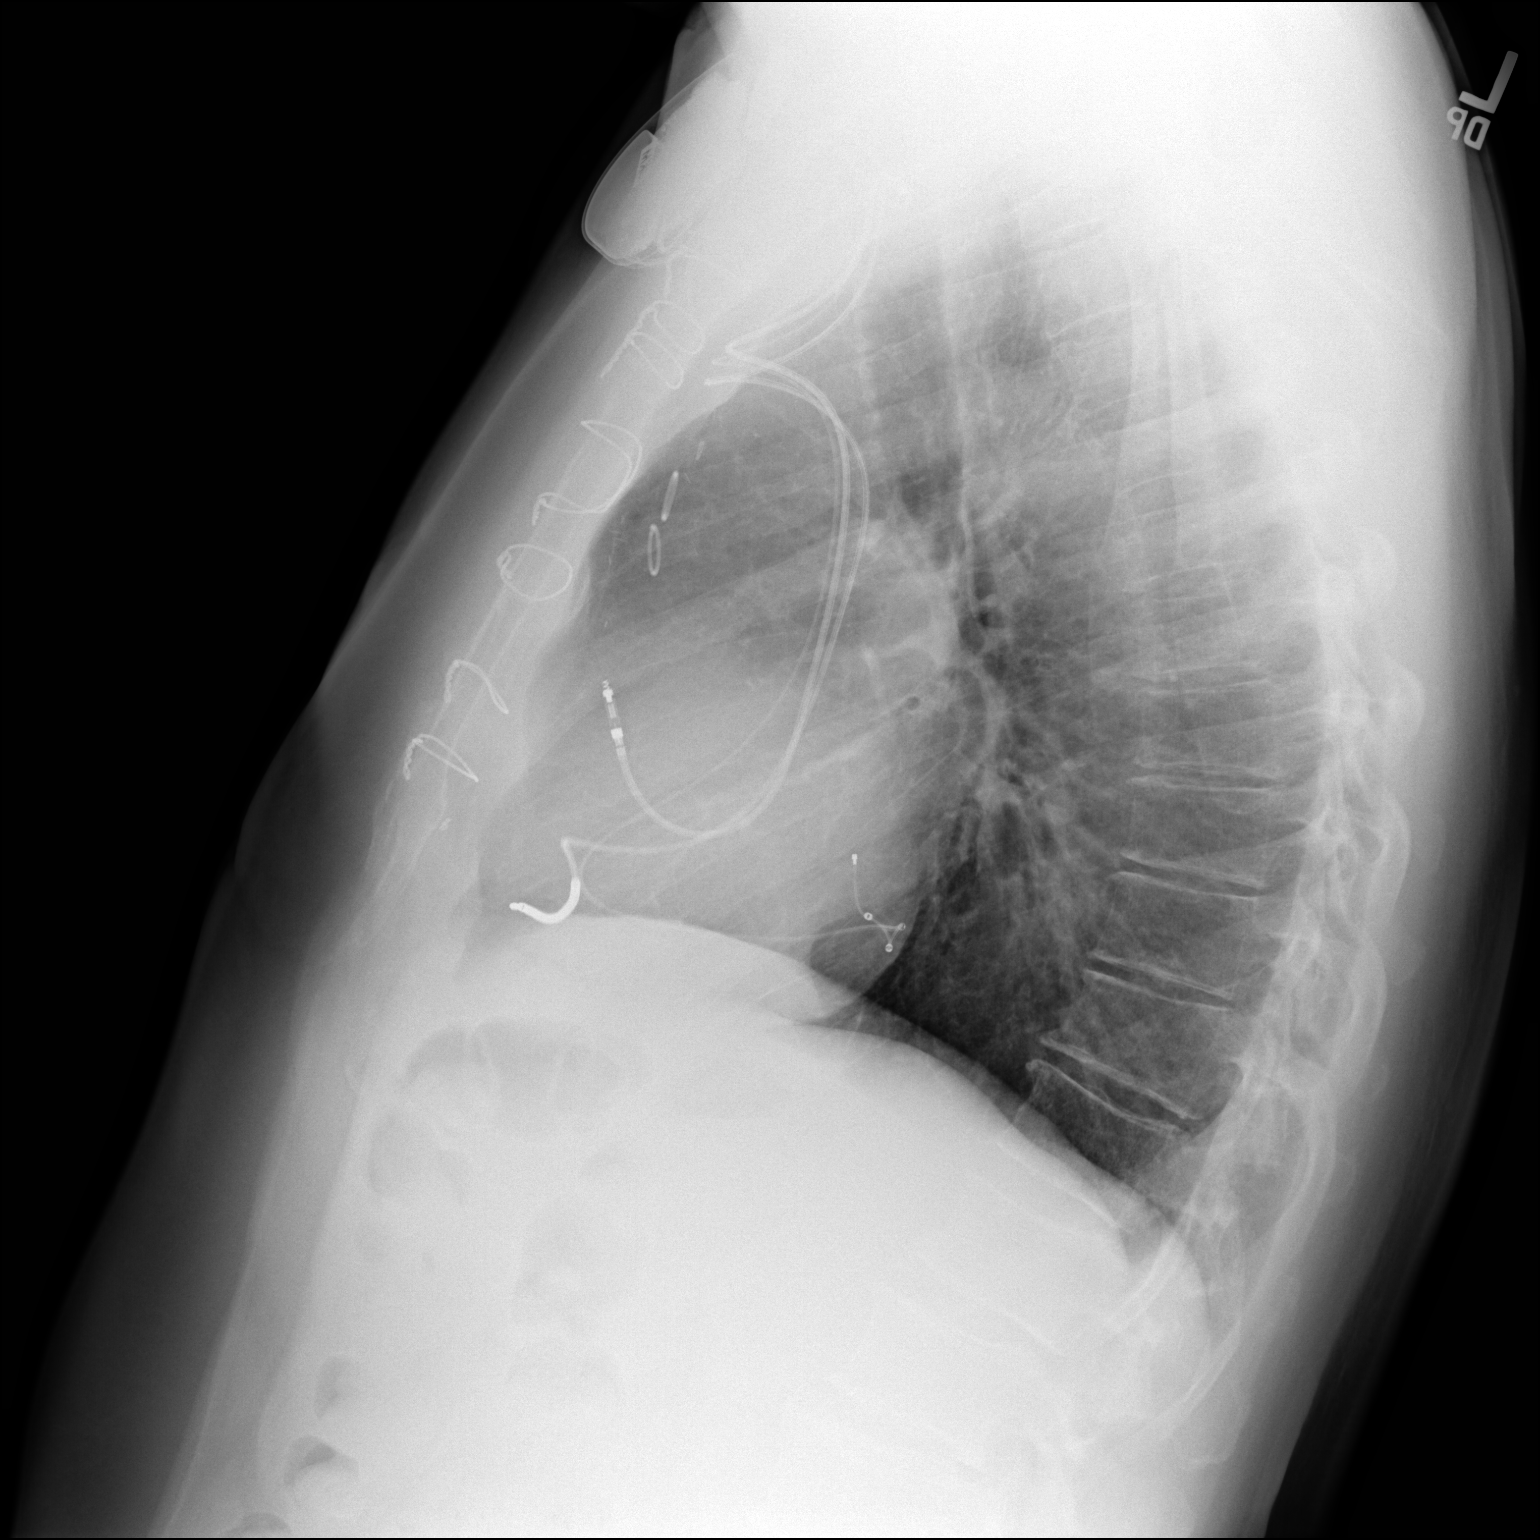

[2 of 2 positions shown; findings below may reference images not displayed]

FINDINGS: The heart size and mediastinal contours are stable. Cardiac
pacemaker leads are identified in the areas of right atrium, right
ventricle and left ventricle unchanged. Both lungs are clear. The
visualized skeletal structures are unremarkable.
IMPRESSION: Cardiac pacemaker leads are identified in the areas of right atrium,
right ventricle and left ventricle unchanged.

## 2022-08-12 ENCOUNTER — Other Ambulatory Visit (HOSPITAL_COMMUNITY): Payer: Self-pay | Admitting: Pediatrics

## 2022-08-13 ENCOUNTER — Encounter (HOSPITAL_COMMUNITY): Payer: Medicare Other | Admitting: Internal Medicine

## 2022-08-13 ENCOUNTER — Ambulatory Visit (HOSPITAL_COMMUNITY): Admission: RE | Admit: 2022-08-13 | Payer: Medicare Other | Source: Ambulatory Visit

## 2022-08-13 NOTE — Progress Notes (Signed)
Remote ICD transmission.   

## 2022-08-14 ENCOUNTER — Ambulatory Visit (HOSPITAL_COMMUNITY)
Admission: RE | Admit: 2022-08-14 | Discharge: 2022-08-14 | Disposition: A | Discharge status: Home / Self Care | Payer: Medicare Other | Source: Ambulatory Visit | Attending: Internal Medicine | Admitting: Internal Medicine

## 2022-08-14 DIAGNOSIS — I5022 Chronic systolic (congestive) heart failure: Secondary | ICD-10-CM | POA: Insufficient documentation

## 2022-08-14 DIAGNOSIS — I110 Hypertensive heart disease with heart failure: Secondary | ICD-10-CM | POA: Diagnosis not present

## 2022-08-14 DIAGNOSIS — Z951 Presence of aortocoronary bypass graft: Secondary | ICD-10-CM | POA: Diagnosis not present

## 2022-08-14 DIAGNOSIS — I429 Cardiomyopathy, unspecified: Secondary | ICD-10-CM | POA: Insufficient documentation

## 2022-08-14 DIAGNOSIS — I252 Old myocardial infarction: Secondary | ICD-10-CM | POA: Insufficient documentation

## 2022-08-14 DIAGNOSIS — E785 Hyperlipidemia, unspecified: Secondary | ICD-10-CM | POA: Insufficient documentation

## 2022-08-14 DIAGNOSIS — E119 Type 2 diabetes mellitus without complications: Secondary | ICD-10-CM | POA: Diagnosis not present

## 2022-08-14 DIAGNOSIS — I2510 Atherosclerotic heart disease of native coronary artery without angina pectoris: Secondary | ICD-10-CM | POA: Diagnosis not present

## 2022-08-14 LAB — ECHOCARDIOGRAM COMPLETE
AR max vel: 3.48 cm2
AV Area VTI: 3.97 cm2
AV Area mean vel: 3.3 cm2
AV Mean grad: 1 mmHg
AV Peak grad: 2.2 mmHg
Ao pk vel: 0.74 m/s
Area-P 1/2: 2.24 cm2
Calc EF: 40.8 %
S' Lateral: 4.2 cm
Single Plane A2C EF: 42 %
Single Plane A4C EF: 37 %

## 2022-08-17 ENCOUNTER — Encounter (HOSPITAL_COMMUNITY): Payer: Medicare Other | Admitting: Internal Medicine

## 2022-08-22 DIAGNOSIS — R9389 Abnormal findings on diagnostic imaging of other specified body structures: Secondary | ICD-10-CM | POA: Diagnosis not present

## 2022-08-22 DIAGNOSIS — I10 Essential (primary) hypertension: Secondary | ICD-10-CM | POA: Diagnosis not present

## 2022-08-22 DIAGNOSIS — R944 Abnormal results of kidney function studies: Secondary | ICD-10-CM | POA: Diagnosis not present

## 2022-08-22 DIAGNOSIS — Z0000 Encounter for general adult medical examination without abnormal findings: Secondary | ICD-10-CM | POA: Diagnosis not present

## 2022-08-22 DIAGNOSIS — E1169 Type 2 diabetes mellitus with other specified complication: Secondary | ICD-10-CM | POA: Diagnosis not present

## 2022-08-23 ENCOUNTER — Other Ambulatory Visit (HOSPITAL_COMMUNITY): Payer: Self-pay | Admitting: Family Medicine

## 2022-08-23 DIAGNOSIS — I5022 Chronic systolic (congestive) heart failure: Secondary | ICD-10-CM | POA: Diagnosis not present

## 2022-08-23 DIAGNOSIS — E7800 Pure hypercholesterolemia, unspecified: Secondary | ICD-10-CM | POA: Diagnosis not present

## 2022-08-23 DIAGNOSIS — E1169 Type 2 diabetes mellitus with other specified complication: Secondary | ICD-10-CM | POA: Diagnosis not present

## 2022-08-23 DIAGNOSIS — K219 Gastro-esophageal reflux disease without esophagitis: Secondary | ICD-10-CM | POA: Diagnosis not present

## 2022-08-23 DIAGNOSIS — R9389 Abnormal findings on diagnostic imaging of other specified body structures: Secondary | ICD-10-CM

## 2022-08-23 DIAGNOSIS — I10 Essential (primary) hypertension: Secondary | ICD-10-CM | POA: Diagnosis not present

## 2022-08-27 ENCOUNTER — Other Ambulatory Visit: Payer: Medicare Other

## 2022-08-27 ENCOUNTER — Ambulatory Visit
Admission: RE | Admit: 2022-08-27 | Discharge: 2022-08-27 | Disposition: A | Discharge status: Home / Self Care | Payer: Medicare Other | Source: Ambulatory Visit | Attending: Family Medicine | Admitting: Family Medicine

## 2022-08-27 DIAGNOSIS — E041 Nontoxic single thyroid nodule: Secondary | ICD-10-CM | POA: Diagnosis not present

## 2022-08-27 DIAGNOSIS — R9389 Abnormal findings on diagnostic imaging of other specified body structures: Secondary | ICD-10-CM

## 2022-09-24 NOTE — Progress Notes (Signed)
[facility])                                            [place] Team                                        Statin Quality Measure Assessment    09/24/2022  Lessie Manigo 11-03-1956 [id number]  Per review of chart and payor information, this patient has been flagged for non-adherence to the following CMS Quality Measure:   []  Statin Use in Persons with Diabetes (SUPD)  [x]  Statin Use in Persons with Cardiovascular Disease(SPC)  The ASCVD Risk score (Arnett DK, et al., 2019) failed to calculate for the following reasons:   The patient has a prior MI or stroke diagnosis  This patient is failing [facility] CMS Measure d/t h/o statin intolerance & previously on atorvastatin. No antihyperlipidemic on file. Cardiology appointment 09/26/2022 and no appointment with PCP. If deemed clinically appropriate, please consider associating exclusion code (see options below) at the next O/v.   Please consider ONE of the following recommendations:   Initiate high intensity statin Atorvastatin 40mg  once daily, #90, 3 refills   Rosuvastatin 20mg  once daily, #90, 3 refills    Initiate moderate intensity          statin with reduced frequency if prior          statin intolerance 1x weekly, #13, 3 refills   2x weekly, #26, 3 refills   3x weekly, #39, 3 refills   Code for past statin intolerance or other exclusions (required annually)  Drug Induced Myopathy G72.0   Myositis, unspecified M60.9   Rhabdomyolysis M62.82   Cirrhosis of liver K74.69   Biliary cirrhosis, unspecified K74.5   Abnormal blood glucose - for SUPD ONLY R73.09   Prediabetes - for SUPD ONLY  R73.03   Thank you for your time,  Kristeen Miss, [place] Cell: [phone]

## 2022-09-26 ENCOUNTER — Ambulatory Visit (HOSPITAL_COMMUNITY)
Admission: RE | Admit: 2022-09-26 | Discharge: 2022-09-26 | Disposition: A | Discharge status: Home / Self Care | Payer: Medicare Other | Source: Ambulatory Visit | Attending: Internal Medicine | Admitting: Internal Medicine

## 2022-09-26 ENCOUNTER — Encounter (HOSPITAL_COMMUNITY): Payer: Self-pay | Admitting: Internal Medicine

## 2022-09-26 VITALS — BP 116/78 | HR 86 | Wt 193.0 lb

## 2022-09-26 DIAGNOSIS — F909 Attention-deficit hyperactivity disorder, unspecified type: Secondary | ICD-10-CM | POA: Diagnosis not present

## 2022-09-26 DIAGNOSIS — I447 Left bundle-branch block, unspecified: Secondary | ICD-10-CM | POA: Diagnosis not present

## 2022-09-26 DIAGNOSIS — I5022 Chronic systolic (congestive) heart failure: Secondary | ICD-10-CM | POA: Insufficient documentation

## 2022-09-26 DIAGNOSIS — I2510 Atherosclerotic heart disease of native coronary artery without angina pectoris: Secondary | ICD-10-CM | POA: Insufficient documentation

## 2022-09-26 DIAGNOSIS — Z79899 Other long term (current) drug therapy: Secondary | ICD-10-CM | POA: Diagnosis not present

## 2022-09-26 DIAGNOSIS — I252 Old myocardial infarction: Secondary | ICD-10-CM | POA: Diagnosis not present

## 2022-09-26 DIAGNOSIS — I25810 Atherosclerosis of coronary artery bypass graft(s) without angina pectoris: Secondary | ICD-10-CM | POA: Diagnosis not present

## 2022-09-26 DIAGNOSIS — E119 Type 2 diabetes mellitus without complications: Secondary | ICD-10-CM | POA: Insufficient documentation

## 2022-09-26 DIAGNOSIS — I25118 Atherosclerotic heart disease of native coronary artery with other forms of angina pectoris: Secondary | ICD-10-CM | POA: Diagnosis not present

## 2022-09-26 DIAGNOSIS — R0683 Snoring: Secondary | ICD-10-CM | POA: Insufficient documentation

## 2022-09-26 DIAGNOSIS — Z7984 Long term (current) use of oral hypoglycemic drugs: Secondary | ICD-10-CM | POA: Insufficient documentation

## 2022-09-26 NOTE — Patient Instructions (Signed)
Your physician recommends that you schedule a follow-up appointment in: 4-6 months with an echocardiogram, **PLEASE CALL OUR OFFICE IN JANUARY TO SCHEDULE THESE APPOINTMENTS.  If you have any questions or concerns before your next appointment please send [place] a message through [place] or call our office at [phone].    TO LEAVE A MESSAGE FOR THE NURSE SELECT OPTION 2, PLEASE LEAVE A MESSAGE INCLUDING: YOUR NAME DATE OF BIRTH CALL BACK NUMBER REASON FOR CALL**this is important as we prioritize the call backs  YOU WILL RECEIVE A CALL BACK THE SAME DAY AS LONG AS YOU CALL BEFORE 4:00 PM  At the [facility], you and your health needs are our priority. As part of our continuing mission to provide you with exceptional heart care, we have created designated Provider Care Teams. These Care Teams include your primary Cardiologist (physician) and Advanced Practice Providers (APPs- Physician Assistants and Nurse Practitioners) who all work together to provide you with the care you need, when you need it.   You may see any of the following providers on your designated Care Team at your next follow up: Dr Glori Bickers Dr Loralie Champagne Dr. Roxana Hires, NP Lyda Jester, [place] [facility] [place], [place] Forestine Na, NP Audry Riles, PharmD   Please be sure to bring in all your medications bottles to every appointment.

## 2022-09-26 NOTE — Progress Notes (Signed)
ADVANCED HF CLINIC NOTE  Referring Physician: Dr. Ellyn Hack Primary Care: Lawerance Cruel, MD Primary Cardiologist: Dr. Ellyn Hack HF Cardiologist: Dr. Haroldine Laws  HPI:  Mr Finkbiner is a 66 year old with CAD s/p CABG 2020, DM2, HL, ADHD previously on adderall,  LBBB and systolic HF due to iCM.     Admitted 8/20 with NSTEMI. LHC with severe 2v CAD with totalled LAD. LVEF at that time was 35 to 45%.  He underwent CABG with LIMA to LAD, SVG to PDA, SVG to PL 08/13/2019 with Dr. Kipp Brood.    In 2021 EF was up to 45-50%.    Admitted with 12/22 with ADHF. Cath with severe 2V CAD with CTO of proximal LAD and mild RCA. LIMA patent.  Chronically occluded SVG-rPDA and SVG-rPL. ECHO EF 20-25%    Underwent CRT-D implant 4/23  Here with his wife for routine f/u. Feeling good, getting back into playing guitar. Walking 3+ miles with no issues. Complaining of intt morning dizziness, intt cough and fatigue. Denies CP or SOB. No edema, orthopnea or PND.    Cardiac Testing  Echo 11/2021 EF 20-25% RV moderately reduced  Echo 1/2022EF 45-50%  Echo 08/14/22 EF 30-35%   RHC/LHC 11/20/21 Severe two-vessel coronary artery disease with chronic total occlusions of proximal LAD and mid RCA.  There is also moderate diffuse disease of the distal LCx. Widely patent LIMA-D3, which backfills the mid and distal LAD. Chronically occluded SVG-rPDA and SVG-rPL. Mildly-moderately elevated left heart filling pressure. Normal right heart filling pressure. Mildly reduced Fick cardiac output.  Recommendations: Continue aggressive secondary prevention of coronary artery disease. Gentle diuresis. Escalate goal-directed medical therapy of HFrEF.  Past Medical History:  Diagnosis Date   CHF (congestive heart failure) (HCC)    Chronic HFrEF (heart failure with reduced ejection fraction) ([place]) 11/17/2021   EF down to 20-25% with Acute Presentation of HFrEF - Global & LAD territory HK.   Coronary artery disease involving  coronary bypass graft of native heart 08/12/2019   SVG-rPDA & SVG-RPL 100% occluded.   HLD (hyperlipidemia)    Hyperlipidemia associated with type 2 diabetes mellitus ([place]) 11/30/2019   Ischemic cardiomyopathy 11/30/2019   Post NSTEMI-CABG, EF increased from 35% to 45% => 11/2021 Acute on Chronic HFrEF -> EF downt to 20-25%. Cath with both SVGs to RPDA & PL along with native RCA CTO.  Patent LIMA-D3(LAD).  L-RPDA collaterals.  = titration of meds limited by Hypotension.   Multivessel CAD - 100% LAD (with Diag), 99% mRCA (subtotal occlusio) 08/12/2019   NSTEMI (non-ST elevated myocardial infarction) ([place]) 08/10/2019   S/P Off Pump CABG x 3 08/13/2019   LIMA to LAD RSVG to PDA RSVG to PLVB   Current Outpatient Medications  Medication Sig Dispense Refill   acetaminophen (TYLENOL) 500 MG tablet Take 500-1,000 mg by mouth every 8 (eight) hours as needed for mild pain.     ALPRAZolam (XANAX) 0.25 MG tablet Take 0.25 mg by mouth daily as needed for anxiety.     amphetamine-dextroamphetamine (ADDERALL) 20 MG tablet Take 10 mg by mouth 4 (four) times daily as needed (focus).     aspirin EC 81 MG tablet Take 81 mg by mouth daily.     carvedilol (COREG) 6.25 MG tablet TAKE ONE TABLET BY MOUTH TWICE DAILY with A meal 60 tablet 5   Cholecalciferol (VITAMIN D3) 50 MCG (2000 UT) capsule Take 2,000 Units by mouth daily.     Evolocumab (REPATHA SURECLICK) [id number] MG/ML SOAJ Inject 140 mg into the  skin every 14 (fourteen) days. 6 mL 3   FARXIGA 10 MG TABS tablet TAKE ONE TABLET BY MOUTH ONCE daily 30 tablet 5   glucose blood test strip TEST FOUR TIMES DAILY OR AS DIRECTED 100 each 1   Lancets (ONETOUCH ULTRASOFT) lancets Use as instructed 100 each 1   MAGNESIUM PO Take 250 mg by mouth daily.     Melatonin 10 MG CAPS Take 10 mg by mouth at bedtime as needed (sleep).     Menaquinone-7 (K2 PO) Take 100 mg by mouth daily.     Multiple Vitamin (MULTI-VITAMIN) tablet Take 1 tablet by mouth daily.     Potassium 99 MG  TABS Patient takes 1/2 tablet by mouth once a day.     sacubitril-valsartan (ENTRESTO) 24-26 MG Take 1 tablet by mouth 2 (two) times daily. 60 tablet 5   No current facility-administered medications for this encounter.   Allergies  Allergen Reactions   Atorvastatin Other (See Comments)    Memory deficit, fatigue, myalgias, insomnia; similar symptoms with rosuvastatin.   Icosapent Ethyl     lethargy   Rosuvastatin Other (See Comments)    Lethargic, fatigue   Social History   Socioeconomic History   Marital status: Married    Spouse name: Not on file   Number of children: Not on file   Years of education: Not on file   Highest education level: Not on file  Occupational History   Not on file  Tobacco Use   Smoking status: Former    Types: Cigars   Smokeless tobacco: Never  Vaping Use   Vaping Use: Never used  Substance and Sexual Activity   Alcohol use: Not Currently    Comment: not in the past 6 months   Drug use: Not Currently   Sexual activity: Not on file  Other Topics Concern   Not on file  Social History Narrative   Not on file   Social Determinants of Health   Financial Resource Strain: Low Risk  (11/27/2021)   Overall Financial Resource Strain (CARDIA)    Difficulty of Paying Living Expenses: Not very hard  Food Insecurity: No Food Insecurity (11/27/2021)   Hunger Vital Sign    Worried About Running Out of Food in the Last Year: Never true    [place] in the Last Year: Never true  Transportation Needs: No Transportation Needs (11/27/2021)   PRAPARE - Hydrologist (Medical): No    Lack of Transportation (Non-Medical): No  Physical Activity: Not on file  Stress: Not on file  Social Connections: Not on file  Intimate Partner Violence: Not on file    No family history on file.  BP 116/78   Pulse 86   Wt 87.5 kg (193 lb)   SpO2 97%   BMI 27.69 kg/m   Wt Readings from Last 3 Encounters:  09/26/22 87.5 kg (193 lb)   07/12/22 92.5 kg (204 lb)  04/30/22 93.1 kg (205 lb 3.2 oz)   PHYSICAL EXAM: General:  well appearing.  No respiratory difficulty HEENT: normal Neck: supple. No JVD. Carotids 2+ bilat; no bruits. No lymphadenopathy or thyromegaly appreciated. Cor: PMI nondisplaced. Regular rate & rhythm. No rubs, gallops or murmurs. ICD scar looks C/D/I Lungs: clear Abdomen: soft, nontender, nondistended. No hepatosplenomegaly. No bruits or masses. Good bowel sounds. Extremities: no cyanosis, clubbing, rash, edema  Neuro: alert & oriented x 3, cranial nerves grossly intact. moves all 4 extremities w/o difficulty. Affect pleasant.  ECG: AV paced 80 QRS 182ms (Personally reviewed)    ICD interrogated in clinic. No VT/AF. 97% BiVpacing (Personally reviewed)    ASSESSMENT & PLAN:  1. Chronic HFrEF due to iCM +/- LBBB - Echo 2021 EF 45-50%.  - Echo 12/22 EF 20-25% - Echo 08/14/22 EF 30-35% - Wide LBBB on ECG s/p STJ CRT-D 4/23 with Dr. Quentin Ore - cMRI 3/23 EF 19% extensive scar  - NYHA II. Activity improving.  - Volume status stable on exam  - Takes Torsemide PRN. 10-20mg , goes by BLE edema. Last took 10mg  this am. Volume status stable. - Continue carvedilol 6.25 mg bid  - Continue Entresto 24-26 mg bid. - Does not want spiro or eplerenone d/t side effects. No change - Continue Farxiga 10 mg daily. - F/u echo in 6 months to assess for LV recovery   2. LBBB - Suspect LBBB CM. - QRS 174 ms pre CRT.149ms today - s/p STJ CRT-D 4/23 with Dr. Quentin Ore   3.CAD - S/P CABG 2021 - LHC 11/2021 severe 2V CAD with CTO of proximal LAD and mild RCA . LIMA patent. + chronically occluded SVG-rPDA and SVG-rPL. - Intolerant statin. On Repatha.  - Continue ASA+ beta blocker.  - No s/s angina - Has completed CR   4. DMII - Continue Farxiga - Consider [id number].   5. ADHD - OK to continue Adderall, takes 4x/day, not tachycardic on exam  6. Snoring - improved. Can consider sleep study down the road.   Earnie Larsson, AGACNP-BC  3:12 PM  Patient seen and examined with the above-signed Advanced Practice Provider and/or Housestaff. I personally reviewed laboratory data, imaging studies and relevant notes. I independently examined the patient and formulated the important aspects of the plan. I have edited the note to reflect any of my changes or salient points. I have personally discussed the plan with the patient and/or family.  Here with his wife. Says he feels much better after CRT. EF up to 30-35%. No CP, edema, orthopnea or PND. Takes torsemide as needed.   ICD site looks good. No VT/AF  Volume ok 97% V-pacing  General:  Well appearing. No resp difficulty HEENT: normal Neck: supple. no JVD. Carotids 2+ bilat; no bruits. No lymphadenopathy or thryomegaly appreciated. Cor: PMI nondisplaced. Regular rate & rhythm. No rubs, gallops or murmurs. Lungs: clear Abdomen: soft, nontender, nondistended. No hepatosplenomegaly. No bruits or masses. Good bowel sounds. Extremities: no cyanosis, clubbing, rash, edema Neuro: alert & orientedx3, cranial nerves grossly intact. moves all 4 extremities w/o difficulty. Affect pleasant  Much improved after CRT. EF up to 30-35% Volume status looks good. On 3/4 GDMT meds. Does not want to use MRA.   ICD interrogated in clinic. 97% VP. No VT/AF.   Glori Bickers, MD  5:19 PM

## 2022-10-08 ENCOUNTER — Ambulatory Visit (INDEPENDENT_AMBULATORY_CARE_PROVIDER_SITE_OTHER): Payer: Medicare Other

## 2022-10-08 DIAGNOSIS — I255 Ischemic cardiomyopathy: Secondary | ICD-10-CM | POA: Diagnosis not present

## 2022-10-10 LAB — CUP PACEART REMOTE DEVICE CHECK
Battery Remaining Longevity: 82 mo
Battery Remaining Percentage: 90 %
Battery Voltage: 2.98 V
Brady Statistic AP VP Percent: 22 %
Brady Statistic AP VS Percent: 1 %
Brady Statistic AS VP Percent: 75 %
Brady Statistic AS VS Percent: 2.8 %
Brady Statistic RA Percent Paced: 21 %
Date Time Interrogation Session: 20231023020537
HighPow Impedance: 72 Ohm
Implantable Lead Connection Status: 753985
Implantable Lead Connection Status: 753985
Implantable Lead Connection Status: 753985
Implantable Lead Implant Date: 20230424
Implantable Lead Implant Date: 20230424
Implantable Lead Implant Date: 20230424
Implantable Lead Location: 753858
Implantable Lead Location: 753859
Implantable Lead Location: 753860
Implantable Pulse Generator Implant Date: 20230424
Lead Channel Impedance Value: 430 Ohm
Lead Channel Impedance Value: 550 Ohm
Lead Channel Impedance Value: 560 Ohm
Lead Channel Pacing Threshold Amplitude: 0.5 V
Lead Channel Pacing Threshold Amplitude: 0.75 V
Lead Channel Pacing Threshold Amplitude: 1 V
Lead Channel Pacing Threshold Pulse Width: 0.5 ms
Lead Channel Pacing Threshold Pulse Width: 0.5 ms
Lead Channel Pacing Threshold Pulse Width: 0.8 ms
Lead Channel Sensing Intrinsic Amplitude: 11.8 mV
Lead Channel Sensing Intrinsic Amplitude: 5 mV
Lead Channel Setting Pacing Amplitude: 1.5 V
Lead Channel Setting Pacing Amplitude: 2 V
Lead Channel Setting Pacing Amplitude: 2.5 V
Lead Channel Setting Pacing Pulse Width: 0.5 ms
Lead Channel Setting Pacing Pulse Width: 0.8 ms
Lead Channel Setting Sensing Sensitivity: 0.5 mV
Pulse Gen Serial Number: 210001616
Zone Setting Status: 755011

## 2022-10-17 ENCOUNTER — Other Ambulatory Visit: Payer: Self-pay | Admitting: Internal Medicine

## 2022-10-17 DIAGNOSIS — I5022 Chronic systolic (congestive) heart failure: Secondary | ICD-10-CM

## 2022-10-23 DIAGNOSIS — K219 Gastro-esophageal reflux disease without esophagitis: Secondary | ICD-10-CM | POA: Diagnosis not present

## 2022-10-23 DIAGNOSIS — E1169 Type 2 diabetes mellitus with other specified complication: Secondary | ICD-10-CM | POA: Diagnosis not present

## 2022-10-23 DIAGNOSIS — I10 Essential (primary) hypertension: Secondary | ICD-10-CM | POA: Diagnosis not present

## 2022-10-23 DIAGNOSIS — I2510 Atherosclerotic heart disease of native coronary artery without angina pectoris: Secondary | ICD-10-CM | POA: Diagnosis not present

## 2022-10-23 DIAGNOSIS — E7800 Pure hypercholesterolemia, unspecified: Secondary | ICD-10-CM | POA: Diagnosis not present

## 2022-10-23 DIAGNOSIS — I5022 Chronic systolic (congestive) heart failure: Secondary | ICD-10-CM | POA: Diagnosis not present

## 2022-11-06 NOTE — Progress Notes (Signed)
Remote ICD transmission.   

## 2022-11-19 ENCOUNTER — Other Ambulatory Visit: Payer: Self-pay | Admitting: Cardiology

## 2022-12-09 ENCOUNTER — Emergency Department (HOSPITAL_COMMUNITY)
Admission: EM | Admit: 2022-12-09 | Discharge: 2022-12-09 | Disposition: A | Discharge status: Home / Self Care | Payer: Medicare Other | Attending: Emergency Medicine | Admitting: Emergency Medicine

## 2022-12-09 ENCOUNTER — Other Ambulatory Visit: Payer: Self-pay

## 2022-12-09 ENCOUNTER — Emergency Department (HOSPITAL_COMMUNITY): Payer: Medicare Other

## 2022-12-09 ENCOUNTER — Encounter (HOSPITAL_COMMUNITY): Payer: Self-pay

## 2022-12-09 DIAGNOSIS — Y92512 Supermarket, store or market as the place of occurrence of the external cause: Secondary | ICD-10-CM | POA: Insufficient documentation

## 2022-12-09 DIAGNOSIS — S065XAA Traumatic subdural hemorrhage with loss of consciousness status unknown, initial encounter: Secondary | ICD-10-CM

## 2022-12-09 DIAGNOSIS — Y9389 Activity, other specified: Secondary | ICD-10-CM | POA: Insufficient documentation

## 2022-12-09 DIAGNOSIS — G4489 Other headache syndrome: Secondary | ICD-10-CM | POA: Diagnosis not present

## 2022-12-09 DIAGNOSIS — Z743 Need for continuous supervision: Secondary | ICD-10-CM | POA: Diagnosis not present

## 2022-12-09 DIAGNOSIS — Y998 Other external cause status: Secondary | ICD-10-CM | POA: Diagnosis not present

## 2022-12-09 DIAGNOSIS — S065X0A Traumatic subdural hemorrhage without loss of consciousness, initial encounter: Secondary | ICD-10-CM | POA: Insufficient documentation

## 2022-12-09 DIAGNOSIS — R110 Nausea: Secondary | ICD-10-CM | POA: Diagnosis not present

## 2022-12-09 DIAGNOSIS — S0990XA Unspecified injury of head, initial encounter: Secondary | ICD-10-CM | POA: Diagnosis not present

## 2022-12-09 DIAGNOSIS — W228XXA Striking against or struck by other objects, initial encounter: Secondary | ICD-10-CM | POA: Diagnosis not present

## 2022-12-09 MED ORDER — PROCHLORPERAZINE EDISYLATE 10 MG/2ML IJ SOLN
5.0000 mg | Freq: Once | INTRAMUSCULAR | Status: AC
  Administered 2022-12-09: 5 mg via INTRAVENOUS
  Filled 2022-12-09: qty 2

## 2022-12-09 MED ORDER — ONDANSETRON 4 MG PO TBDP
ORAL_TABLET | ORAL | 0 refills | Status: DC
Start: 2022-12-09 — End: 2022-12-09

## 2022-12-09 MED ORDER — SODIUM CHLORIDE 0.9 % IV BOLUS
1000.0000 mL | Freq: Once | INTRAVENOUS | Status: AC
  Administered 2022-12-09: 1000 mL via INTRAVENOUS

## 2022-12-09 MED ORDER — LEVETIRACETAM 500 MG PO TABS: 500.0000 mg | ORAL_TABLET | Freq: Two times a day (BID) | ORAL | 0 refills | Status: DC

## 2022-12-09 MED ORDER — DIPHENHYDRAMINE HCL 50 MG/ML IJ SOLN
12.5000 mg | Freq: Once | INTRAMUSCULAR | Status: AC
  Administered 2022-12-09: 12.5 mg via INTRAVENOUS
  Filled 2022-12-09: qty 1

## 2022-12-09 MED ORDER — ONDANSETRON 4 MG PO TBDP: ORAL_TABLET | ORAL | 0 refills | Status: DC

## 2022-12-09 NOTE — ED Provider Notes (Signed)
[facility]-EMERGENCY DEPT Provider Note   CSN: [id number] Arrival date & time: 12/09/22  1656     History  Chief Complaint  Patient presents with   Anthony Alvarez    Anthony Alvarez is a 66 y.o. male.  66 yo M with a chief complaints of headache photophobia nausea.  This has been going on for about 4 days.  He had suffered a fall at a grocery store and struck his head.  He denies other significant injury.  Denies neck pain denies chest pain denies abdominal pain denies back pain extremity pain.  He also gets symptoms gotten progressively worse.  He had eventually decided to come to the ED to be evaluated.   Fall       Home Medications Prior to Admission medications   Medication Sig Start Date End Date Taking? Authorizing Provider  ondansetron (ZOFRAN-ODT) 4 MG disintegrating tablet 4mg  ODT q4 hours prn nausea/vomit 12/09/22  Yes 12/11/22, DO  acetaminophen (TYLENOL) 500 MG tablet Take 500-1,000 mg by mouth every 8 (eight) hours as needed for mild pain.    [provider]  ALPRAZolam Melene Plan) 0.25 MG tablet Take 0.25 mg by mouth daily as needed for anxiety. 03/06/22   [provider]  amphetamine-dextroamphetamine (ADDERALL) 20 MG tablet Take 10 mg by mouth 4 (four) times daily as needed (focus).    [provider]  aspirin EC 81 MG tablet Take 81 mg by mouth daily.    [provider]  carvedilol (COREG) 6.25 MG tablet TAKE ONE TABLET BY MOUTH TWICE DAILY with A meal 06/18/22   Bensimhon, 08/19/22, MD  Cholecalciferol (VITAMIN D3) 50 MCG (2000 UT) capsule Take 2,000 Units by mouth daily.    [provider]  FARXIGA 10 MG TABS tablet TAKE ONE TABLET BY MOUTH ONCE daily 06/18/22   Bensimhon, 08/19/22, MD  glucose blood test strip TEST FOUR TIMES DAILY OR AS DIRECTED 08/18/19   10/18/19 M, PA-C  Lancets [facility] ULTRASOFT) lancets Use as instructed 08/18/19   10/18/19, PA-C  MAGNESIUM PO Take 250 mg by mouth  daily.    [provider]  Melatonin 10 MG CAPS Take 10 mg by mouth at bedtime as needed (sleep).    [provider]  Menaquinone-7 (K2 PO) Take 100 mg by mouth daily.    [provider]  Multiple Vitamin (MULTI-VITAMIN) tablet Take 1 tablet by mouth daily.    [provider]  Potassium 99 MG TABS Patient takes 1/2 tablet by mouth once a day.    [provider]  REPATHA SURECLICK 140 MG/ML SOAJ Inject 140mg  into THE SKIN every 14 DAYS 11/19/22   , MD  sacubitril-valsartan (ENTRESTO) 24-26 MG Take 1 tablet by mouth 2 (two) times daily. 07/05/22   Bensimhon, Marykay Lex, MD  torsemide (DEMADEX) 20 MG tablet TAKE 1 OR 2 TABLETS BY MOUTH ONCE DAILY 10/17/22   Bevelyn Buckles, NP      Allergies    Atorvastatin, Icosapent ethyl, and Rosuvastatin    Review of Systems   Review of Systems  Physical Exam Updated Vital Signs BP 138/71   Pulse 89   Temp 98.4 F (36.9 C) (Oral)   Resp 18   Ht 5\' 10"  (1.778 m)   Wt 84.4 kg   SpO2 97%   BMI 26.69 kg/m  Physical Exam Vitals and nursing note reviewed.  Constitutional:      Appearance: He is well-developed.  HENT:  Head: Normocephalic and atraumatic.  Eyes:     Pupils: Pupils are equal, round, and reactive to light.  Neck:     Vascular: No JVD.  Cardiovascular:     Rate and Rhythm: Normal rate and regular rhythm.     Heart sounds: No murmur heard.    No friction rub. No gallop.  Pulmonary:     Effort: No respiratory distress.     Breath sounds: No wheezing.  Abdominal:     General: There is no distension.     Tenderness: There is no abdominal tenderness. There is no guarding or rebound.  Musculoskeletal:        General: Normal range of motion.     Cervical back: Normal range of motion and neck supple.  Skin:    Coloration: Skin is not pale.     Findings: No rash.  Neurological:     Mental Status: He is alert and oriented to person, place, and time.     Cranial Nerves:  Cranial nerves 2-12 are intact.     Sensory: Sensation is intact.     Motor: Motor function is intact.     Coordination: Coordination is intact.     Gait: Gait is intact.     Comments: Mild photophobia otherwise benign exam  Psychiatric:        Behavior: Behavior normal.     ED Results / Procedures / Treatments   Labs (all labs ordered are listed, but only abnormal results are displayed) Labs Reviewed - No data to display  EKG None  Radiology CT Head Wo Contrast  Result Date: 12/09/2022 CLINICAL DATA:  [place], head trauma EXAM: CT HEAD WITHOUT CONTRAST TECHNIQUE: Contiguous axial images were obtained from the base of the skull through the vertex without intravenous contrast. RADIATION DOSE REDUCTION: This exam was performed according to the departmental dose-optimization program which includes automated exposure control, adjustment of the mA and/or kV according to patient size and/or use of iterative reconstruction technique. COMPARISON:  None Available. FINDINGS: Brain: There is an acute right-sided subdural hematoma along the frontal and temporal regions, measuring up to 8 mm in thickness. Minimal leftward midline shift measuring 3 mm at the level of the septum pellucidum. No other significant mass effect. No acute infarct. The lateral ventricles and midline structures are otherwise unremarkable. Vascular: No hyperdense vessel or unexpected calcification. Skull: Normal. Negative for fracture or focal lesion. Sinuses/Orbits: No acute finding. Other: None. IMPRESSION: 1. Acute right-sided subdural hematoma measuring up to 8 mm in maximal thickness. Minimal leftward midline shift. 2. No acute infarct. Critical Value/emergent results were called by telephone at the time of interpretation on 12/09/2022 at 6:26 pm to provider Eloina Ergle , who verbally acknowledged these results. Electronically Signed   By: Randa Ngo M.D.   On: 12/09/2022 18:29    Procedures .Critical Care  Performed by:  Deno Etienne, DO Authorized by: Deno Etienne, DO   Critical care provider statement:    Critical care time (minutes):  35   Critical care time was exclusive of:  Separately billable procedures and treating other patients   Critical care was time spent personally by me on the following activities:  Development of treatment plan with patient or surrogate, discussions with consultants, evaluation of patient's response to treatment, examination of patient, ordering and review of laboratory studies, ordering and review of radiographic studies, ordering and performing treatments and interventions, pulse oximetry, re-evaluation of patient's condition and review of old charts   Care discussed with: admitting  provider       Medications Ordered in ED Medications  prochlorperazine (COMPAZINE) injection 5 mg (5 mg Intravenous Given 12/09/22 1838)  diphenhydrAMINE (BENADRYL) injection 12.5 mg (12.5 mg Intravenous Given 12/09/22 1840)  sodium chloride 0.9 % bolus 1,000 mL (1,000 mLs Intravenous New Bag/Given 12/09/22 1841)    ED Course/ Medical Decision Making/ A&P                           Medical Decision Making Amount and/or Complexity of Data Reviewed Radiology: ordered.  Risk Prescription drug management.   66 yo M with a chief complaint of headache and nausea.  This has been going on for about 4 days.  It is status post a closed head injury.  He has a benign neurologic exam here.  Will obtain a CT scan to evaluate for intracranial bleeding.  Headache cocktail.  Bolus of IV fluids.  Reassess.  CT scan with a right-sided subdural with some right to left shift.  I discussed this with Dr. Marcello Moores, neurosurgery he felt he has the event happening 4 days ago and due to the size of bleeding felt reasonable for outpatient follow-up in 2 weeks time.  Patient is feeling quite a bit better after headache cocktail.  Nausea is improved.  7:56 PM:  I have discussed the diagnosis/risks/treatment options with  the patient.  Evaluation and diagnostic testing in the emergency department does not suggest an emergent condition requiring admission or immediate intervention beyond what has been performed at this time.  They will follow up with PCP. We also discussed returning to the ED immediately if new or worsening sx occur. We discussed the sx which are most concerning (e.g., sudden worsening pain, fever, inability to tolerate by mouth) that necessitate immediate return. Medications administered to the patient during their visit and any new prescriptions provided to the patient are listed below.  Medications given during this visit Medications  prochlorperazine (COMPAZINE) injection 5 mg (5 mg Intravenous Given 12/09/22 1838)  diphenhydrAMINE (BENADRYL) injection 12.5 mg (12.5 mg Intravenous Given 12/09/22 1840)  sodium chloride 0.9 % bolus 1,000 mL (1,000 mLs Intravenous New Bag/Given 12/09/22 1841)     The patient appears reasonably screen and/or stabilized for discharge and I doubt any other medical condition or other [facility] requiring further screening, evaluation, or treatment in the ED at this time prior to discharge.          Final Clinical Impression(s) / ED Diagnoses Final diagnoses:  SDH (subdural hematoma) ([place])    Rx / DC Orders ED Discharge Orders          Ordered    ondansetron (ZOFRAN-ODT) 4 MG disintegrating tablet        12/09/22 1951              Deno Etienne, DO 12/09/22 1956

## 2022-12-09 NOTE — ED Notes (Signed)
Pt wheeled from ED . Follow up care reviewed. Wheeled from ed . Transferred to vehicle . Family to drive home

## 2022-12-09 NOTE — Progress Notes (Signed)
I reviewed CT head.  Small right convexity SDH after fall 4 days ago.  By report, not on blood thinners.  As it is a small SDH without significant midline shift and his fall was 4 days ago, if he is medically and neurologically stable, he can f/u with a repeat CT head in 2 weeks.  Recommend 7 days Keppra 500 BID.

## 2022-12-09 NOTE — Discharge Instructions (Signed)
The neurosurgeon felt like this could be follow-up in the office in a couple weeks for recheck.  This does not mean that everything will be okay.  If your symptoms get worse you are confused you have one-sided numbness or weakness difficulty speech or swallowing or uncontrolled vomiting then please return to the emergency ferment for repeat evaluation.

## 2022-12-09 NOTE — ED Triage Notes (Signed)
Pt coming from home with c/o fall 4 days ago. Pt states he fell while leaning on grocery cart and hit head. Denies LOC and blood thinners. Patient reports more lethargy since, nausea, pounding headache, and dizziness when moving around.

## 2022-12-14 ENCOUNTER — Emergency Department (HOSPITAL_COMMUNITY)
Admission: EM | Admit: 2022-12-14 | Discharge: 2022-12-15 | Disposition: A | Discharge status: Home / Self Care | Payer: Medicare Other | Attending: Emergency Medicine | Admitting: Emergency Medicine

## 2022-12-14 ENCOUNTER — Emergency Department (HOSPITAL_COMMUNITY): Payer: Medicare Other

## 2022-12-14 ENCOUNTER — Other Ambulatory Visit: Payer: Self-pay

## 2022-12-14 ENCOUNTER — Encounter (HOSPITAL_COMMUNITY): Payer: Self-pay

## 2022-12-14 DIAGNOSIS — S065X0D Traumatic subdural hemorrhage without loss of consciousness, subsequent encounter: Secondary | ICD-10-CM | POA: Diagnosis not present

## 2022-12-14 DIAGNOSIS — Z79899 Other long term (current) drug therapy: Secondary | ICD-10-CM | POA: Diagnosis not present

## 2022-12-14 DIAGNOSIS — R110 Nausea: Secondary | ICD-10-CM | POA: Diagnosis not present

## 2022-12-14 DIAGNOSIS — S0990XD Unspecified injury of head, subsequent encounter: Secondary | ICD-10-CM | POA: Diagnosis present

## 2022-12-14 DIAGNOSIS — Z7982 Long term (current) use of aspirin: Secondary | ICD-10-CM | POA: Diagnosis not present

## 2022-12-14 DIAGNOSIS — Z743 Need for continuous supervision: Secondary | ICD-10-CM | POA: Diagnosis not present

## 2022-12-14 DIAGNOSIS — W01198D Fall on same level from slipping, tripping and stumbling with subsequent striking against other object, subsequent encounter: Secondary | ICD-10-CM | POA: Diagnosis not present

## 2022-12-14 DIAGNOSIS — R519 Headache, unspecified: Secondary | ICD-10-CM | POA: Diagnosis not present

## 2022-12-14 DIAGNOSIS — G4489 Other headache syndrome: Secondary | ICD-10-CM | POA: Diagnosis not present

## 2022-12-14 MED ORDER — DIPHENHYDRAMINE HCL 50 MG/ML IJ SOLN
25.0000 mg | Freq: Once | INTRAMUSCULAR | Status: AC
  Administered 2022-12-15: 25 mg via INTRAVENOUS
  Filled 2022-12-14: qty 1

## 2022-12-14 MED ORDER — METOCLOPRAMIDE HCL 5 MG/ML IJ SOLN
10.0000 mg | Freq: Once | INTRAMUSCULAR | Status: AC
  Administered 2022-12-14: 10 mg via INTRAVENOUS
  Filled 2022-12-14: qty 2

## 2022-12-14 MED ORDER — PROCHLORPERAZINE EDISYLATE 10 MG/2ML IJ SOLN
10.0000 mg | Freq: Once | INTRAMUSCULAR | Status: AC
  Administered 2022-12-15: 10 mg via INTRAVENOUS
  Filled 2022-12-14: qty 2

## 2022-12-14 MED ORDER — DEXAMETHASONE SODIUM PHOSPHATE 10 MG/ML IJ SOLN
10.0000 mg | Freq: Once | INTRAMUSCULAR | Status: AC
  Administered 2022-12-15: 10 mg via INTRAVENOUS
  Filled 2022-12-14: qty 1

## 2022-12-14 MED ORDER — SODIUM CHLORIDE 0.9 % IV BOLUS
500.0000 mL | Freq: Once | INTRAVENOUS | Status: AC
  Administered 2022-12-14: 500 mL via INTRAVENOUS

## 2022-12-14 NOTE — ED Notes (Signed)
Patient family member interrupted while this RN with another patient. Explained to family that someone would be with patient in just a couple of minutes. Family persisted and this RN restated that this RN was with another patient and would be there in just a couple of minutes.

## 2022-12-14 NOTE — ED Provider Notes (Signed)
[facility] EMERGENCY DEPARTMENT Provider Note   CSN: [id number] Arrival date & time: 12/14/22  1946     History  Chief Complaint  Patient presents with   Headache    Patient BIB EMS from home for complaint of headache. Per patient he was diagnosed with concussion & small brain bleed" on 12/05/22. Patient reports headache continues to get worse after taking OTC medication. Patient A&O x 3.     Anthony Alvarez is a 66 y.o. male presenting to the ED by EMS concern for headache.  The patient reports he had a fall in the grocery store and struck his head about 9 days ago, developing a bad headache and nausea and light sensitivity.  He went to the emergency department on Christmas Eve, 12/09/22, at which time he had a CT scan which showed a subdural bleed.  The case was discussed on phone with the neurosurgeon, felt that the patient's bleeding was stable at this point, given that has been several days, he can follow-up in the office with a neurosurgeon in 2 weeks.  The patient returns today reporting that his symptoms have worsened.  They had initially improved after the migraine medications given in the ED on the 24th.  Now he is again having about headache with some nausea.  He can only take Tylenol at home as he is avoiding NSAID medications due to his bleed.  It is not helping.  He is also here with his wife.  He denies any further trauma   HPI     Home Medications Prior to Admission medications   Medication Sig Start Date End Date Taking? Authorizing Provider  acetaminophen (TYLENOL) 500 MG tablet Take 500-1,000 mg by mouth every 8 (eight) hours as needed for mild pain.    [provider]  ALPRAZolam Prudy Feeler) 0.25 MG tablet Take 0.25 mg by mouth daily as needed for anxiety. 03/06/22   [provider]  amphetamine-dextroamphetamine (ADDERALL) 20 MG tablet Take 10 mg by mouth 4 (four) times daily as needed (focus).    [provider]  aspirin EC  81 MG tablet Take 81 mg by mouth daily.    [provider]  carvedilol (COREG) 6.25 MG tablet TAKE ONE TABLET BY MOUTH TWICE DAILY with A meal 06/18/22   Bensimhon, Bevelyn Buckles, MD  Cholecalciferol (VITAMIN D3) 50 MCG (2000 UT) capsule Take 2,000 Units by mouth daily.    [provider]  FARXIGA 10 MG TABS tablet TAKE ONE TABLET BY MOUTH ONCE daily 06/18/22   Bensimhon, Bevelyn Buckles, MD  glucose blood test strip TEST FOUR TIMES DAILY OR AS DIRECTED 08/18/19   Doree Fudge M, PA-C  Lancets [facility] ULTRASOFT) lancets Use as instructed 08/18/19   Ardelle Balls, PA-C  levETIRAcetam (KEPPRA) 500 MG tablet Take 1 tablet (500 mg total) by mouth 2 (two) times daily for 7 days. 12/09/22 12/16/22  Melene Plan, DO  MAGNESIUM PO Take 250 mg by mouth daily.    [provider]  Melatonin 10 MG CAPS Take 10 mg by mouth at bedtime as needed (sleep).    [provider]  Menaquinone-7 (K2 PO) Take 100 mg by mouth daily.    [provider]  Multiple Vitamin (MULTI-VITAMIN) tablet Take 1 tablet by mouth daily.    [provider]  ondansetron (ZOFRAN-ODT) 4 MG disintegrating tablet 4mg  ODT q4 hours prn nausea/vomit 12/09/22   12/11/22, DO  Potassium 99 MG TABS Patient takes 1/2 tablet by mouth once  a day.    [provider]  REPATHA SURECLICK 140 MG/ML SOAJ Inject 140mg  into THE SKIN every 14 DAYS 11/19/22   14/4/23, MD  sacubitril-valsartan (ENTRESTO) 24-26 MG Take 1 tablet by mouth 2 (two) times daily. 07/05/22   Bensimhon, 07/07/22, MD  torsemide (DEMADEX) 20 MG tablet TAKE 1 OR 2 TABLETS BY MOUTH ONCE DAILY 10/17/22   13/1/23, NP      Allergies    Atorvastatin, Icosapent ethyl, and Rosuvastatin    Review of Systems   Review of Systems  Physical Exam Updated Vital Signs BP (!) 117/49   Pulse 60   Temp 98.1 F (36.7 C) (Oral)   Resp 18   Ht 5\' 10"  (1.778 m)   Wt 84.8 kg   SpO2 100%   BMI 26.83 kg/m  Physical  Exam Constitutional:      General: He is not in acute distress. HENT:     Head: Normocephalic and atraumatic.  Eyes:     Conjunctiva/sclera: Conjunctivae normal.     Pupils: Pupils are equal, round, and reactive to light.  Cardiovascular:     Rate and Rhythm: Normal rate and regular rhythm.  Pulmonary:     Effort: Pulmonary effort is normal. No respiratory distress.  Abdominal:     General: There is no distension.     Tenderness: There is no abdominal tenderness.  Musculoskeletal:     Cervical back: Normal range of motion and neck supple. No rigidity.  Skin:    General: Skin is warm and dry.  Neurological:     General: No focal deficit present.     Mental Status: He is alert. Mental status is at baseline.     GCS: GCS eye subscore is 4. GCS verbal subscore is 5. GCS motor subscore is 6.  Psychiatric:        Mood and Affect: Mood normal.        Behavior: Behavior normal.     ED Results / Procedures / Treatments   Labs (all labs ordered are listed, but only abnormal results are displayed) Labs Reviewed - No data to display  EKG None  Radiology CT Head Wo Contrast  Result Date: 12/14/2022 CLINICAL DATA:  Headache follow-up subdural hematoma EXAM: CT HEAD WITHOUT CONTRAST TECHNIQUE: Contiguous axial images were obtained from the base of the skull through the vertex without intravenous contrast. RADIATION DOSE REDUCTION: This exam was performed according to the departmental dose-optimization program which includes automated exposure control, adjustment of the mA and/or kV according to patient size and/or use of iterative reconstruction technique. COMPARISON:  CT brain 12/09/2022 FINDINGS: Brain: Redemonstrated right convexity subdural hematoma, this measures 9 mm maximum thickness on axial series 7, image 15 and coronal series 5, image 32, compared with 8 mm previously. Fracture small amount of inter hemispheric blood anteriorly adjacent to the falx. Hematoma slightly less dense  compared to the prior head CT. Persistent 2 mm midline shift to the left, previously 3 mm. No new hemorrhage is visualized. Mild sulcal effacement on the right consistent with edema. Stable ventricle size. Cisterns are patent. Vascular: No hyperdense vessels.  No unexpected calcification Skull: Normal. Negative for fracture or focal lesion. Sinuses/Orbits: No acute finding. Other: None IMPRESSION: 1. Grossly stable right hemispheric subdural hematoma with small amount of anterior inter hemispheric blood along the falx. Residual 2 mm midline shift to the left, previously 3 mm. No new hemorrhage is visualized. The ventricles are stable in size. Electronically Signed  By: Jasmine Pang M.D.   On: 12/14/2022 20:43    Procedures Procedures    Medications Ordered in ED Medications  dexamethasone (DECADRON) injection 10 mg (has no administration in time range)  prochlorperazine (COMPAZINE) injection 10 mg (has no administration in time range)  diphenhydrAMINE (BENADRYL) injection 25 mg (has no administration in time range)  metoCLOPramide (REGLAN) injection 10 mg (10 mg Intravenous Given 12/14/22 2049)  sodium chloride 0.9 % bolus 500 mL (0 mLs Intravenous Stopped 12/14/22 2208)    ED Course/ Medical Decision Making/ A&P Clinical Course as of 12/14/22 2304  Fri Dec 14, 2022  2254 Patient is still having significant headache, 8 out of 10 pain.  His wife is not present at the bedside.  Will move onto additional IV migraine medications. [MT]    Clinical Course User Index [MT] Makhayla Mcmurry, Kermit Balo, MD                           Medical Decision Making Amount and/or Complexity of Data Reviewed Radiology: ordered.  Risk Prescription drug management.   This patient presents to the ED with concern for headache. This involves an extensive number of treatment options, and is a complaint that carries with it a high risk of complications and morbidity.  The differential diagnosis includes concussion versus  continued brain bleed irritation versus worsening bleed versus other Additional history obtained from his wife at bedside  External records from outside source obtained and reviewed including recent CT scan showing subdural bleed and ED documentation from ED visit 5 days ago   I ordered imaging studies including CT scan of the head I independently visualized and interpreted imaging which showed stable subdural brain bleed, no interval worsening over the past 5 days. I agree with the radiologist interpretation  I ordered medication including IV migraine and headache medications  Test Considered: There is no fever or meningismus or signs or symptoms of meningitis or CNS infection.  I do not feel he needed an emergent lumbar puncture at this time.  I suspect this is continued pain from concussion and brain bleed irritation.  Dispostion:  Patient signed out to Dr Posey Rea EDP pending reassessment after migraine cocktail, if improved could be discharged with compazine. .        Final Clinical Impression(s) / ED Diagnoses Final diagnoses:  None    Rx / DC Orders ED Discharge Orders     None         Debra Calabretta, Kermit Balo, MD 12/14/22 2304

## 2022-12-15 MED ORDER — LORAZEPAM 2 MG/ML IJ SOLN
0.5000 mg | Freq: Once | INTRAMUSCULAR | Status: AC
Start: 2022-12-15 — End: 2022-12-15
  Administered 2022-12-15: 0.5 mg via INTRAVENOUS
  Filled 2022-12-15: qty 1

## 2022-12-15 MED ORDER — HYDROCODONE-ACETAMINOPHEN 5-325 MG PO TABS: 1.0000 | ORAL_TABLET | ORAL | 0 refills | Status: DC | PRN

## 2022-12-15 MED ORDER — DROPERIDOL 2.5 MG/ML IJ SOLN
1.2500 mg | Freq: Once | INTRAMUSCULAR | Status: AC
  Administered 2022-12-15: 1.25 mg via INTRAVENOUS
  Filled 2022-12-15: qty 2

## 2022-12-15 MED ORDER — AMITRIPTYLINE HCL 25 MG PO TABS: 12.5000 mg | ORAL_TABLET | Freq: Every evening | ORAL | 0 refills | Status: DC | PRN

## 2022-12-15 MED ORDER — MAGNESIUM SULFATE 2 GM/50ML IV SOLN
2.0000 g | Freq: Once | INTRAVENOUS | Status: AC
  Administered 2022-12-15: 2 g via INTRAVENOUS
  Filled 2022-12-15: qty 50

## 2022-12-15 NOTE — ED Notes (Signed)
Patient verbalizes understanding of d/c instructions. Opportunities for questions and answers were provided. Pt d/c from ED and wheeled to lobby with family.  

## 2022-12-15 NOTE — ED Notes (Signed)
Patient becoming increasingly aggressive and agitated, patient removed all monitoring equipment and throwing blankets across room and yelling at spouse that is in the room. MD notified and is at bedside to re evaluate patient .

## 2022-12-15 NOTE — ED Provider Notes (Signed)
  Physical Exam  BP 124/70   Pulse 82   Temp 98.2 F (36.8 C) (Oral)   Resp 18   Ht 5\' 10"  (1.778 m)   Wt 84.8 kg   SpO2 95%   BMI 26.83 kg/m   Physical Exam Vitals and nursing note reviewed.  Constitutional:      General: He is not in acute distress.    Appearance: He is well-developed.  HENT:     Head: Normocephalic and atraumatic.  Eyes:     Conjunctiva/sclera: Conjunctivae normal.  Cardiovascular:     Rate and Rhythm: Normal rate and regular rhythm.     Heart sounds: No murmur heard. Pulmonary:     Effort: Pulmonary effort is normal. No respiratory distress.  Musculoskeletal:        General: No swelling.     Cervical back: Neck supple.  Skin:    General: Skin is warm and dry.  Neurological:     Mental Status: He is alert.  Psychiatric:        Mood and Affect: Mood normal.     Procedures  Procedures  ED Course / MDM   Clinical Course as of 12/15/22 0506  Fri Dec 14, 2022  2254 Patient is still having significant headache, 8 out of 10 pain.  His wife is not present at the bedside.  Will move onto additional IV migraine medications. [MT]    Clinical Course User Index [MT] Trifan, Dec 16, 2022, MD   Medical Decision Making Amount and/or Complexity of Data Reviewed Radiology: ordered.  Risk Prescription drug management.   Patient received an handoff.  Headache in the setting of a stable subdural hematoma.  At time of signout, patient pending headache cocktail and reevaluation.  On reevaluation, headache cocktail not significant improvement symptoms and patient required step up in therapy and ultimately receive droperidol and Ativan.  On second reevaluation his headache has significant proved.  I did speak with Dr. Kermit Balo of neurology about potential agents to use for headaches in this scenario and he is in agreement that amitriptyline may be helpful here nightly but also to provide a short course of opioids given that this is a traumatic headache.  He is  recommending that the patient reach out to his neurosurgeon for further recommendations in the outpatient setting and I did provide the patient with prescriptions for amitriptyline and Vicodin.  Patient then discharged       Amada Jupiter, MD 12/15/22 [phone]

## 2022-12-18 DIAGNOSIS — I6200 Nontraumatic subdural hemorrhage, unspecified: Secondary | ICD-10-CM | POA: Diagnosis not present

## 2022-12-18 DIAGNOSIS — G44309 Post-traumatic headache, unspecified, not intractable: Secondary | ICD-10-CM | POA: Diagnosis not present

## 2022-12-19 ENCOUNTER — Other Ambulatory Visit (HOSPITAL_COMMUNITY): Payer: Self-pay | Admitting: Internal Medicine

## 2022-12-24 DIAGNOSIS — S065XAA Traumatic subdural hemorrhage with loss of consciousness status unknown, initial encounter: Secondary | ICD-10-CM | POA: Diagnosis not present

## 2022-12-26 ENCOUNTER — Other Ambulatory Visit: Payer: Self-pay | Admitting: Neurosurgery

## 2022-12-26 DIAGNOSIS — S065XAA Traumatic subdural hemorrhage with loss of consciousness status unknown, initial encounter: Secondary | ICD-10-CM

## 2023-01-03 ENCOUNTER — Telehealth (HOSPITAL_COMMUNITY): Payer: Self-pay | Admitting: Cardiology

## 2023-01-03 NOTE — Telephone Encounter (Signed)
LMOM WITH INSTRUCTIONS TO SEND TRANSMISSION

## 2023-01-03 NOTE — Telephone Encounter (Signed)
Pt called with concerns of nausea, HA, ringing in ears, numbness in hand at times.   Home weight 185-195 over the past 3 week +SOB -C/p +LE edema  Reports symptoms remind him of how he felt prior to ER visit that pacemaker was placed   Please advise

## 2023-01-04 ENCOUNTER — Other Ambulatory Visit (HOSPITAL_COMMUNITY): Payer: Self-pay

## 2023-01-04 NOTE — Telephone Encounter (Signed)
Pt aware Reports he will send transmission shortly

## 2023-01-07 ENCOUNTER — Ambulatory Visit: Payer: Medicare Other | Attending: Cardiology

## 2023-01-07 DIAGNOSIS — I255 Ischemic cardiomyopathy: Secondary | ICD-10-CM

## 2023-01-08 NOTE — Telephone Encounter (Signed)
Report printed and left with provider -pt reports he is not feeling worse

## 2023-01-09 ENCOUNTER — Ambulatory Visit (INDEPENDENT_AMBULATORY_CARE_PROVIDER_SITE_OTHER): Payer: Medicare Other

## 2023-01-09 DIAGNOSIS — S065XAA Traumatic subdural hemorrhage with loss of consciousness status unknown, initial encounter: Secondary | ICD-10-CM

## 2023-01-09 DIAGNOSIS — R519 Headache, unspecified: Secondary | ICD-10-CM | POA: Diagnosis not present

## 2023-01-09 DIAGNOSIS — S065XAD Traumatic subdural hemorrhage with loss of consciousness status unknown, subsequent encounter: Secondary | ICD-10-CM | POA: Diagnosis not present

## 2023-01-09 LAB — CUP PACEART REMOTE DEVICE CHECK
Battery Remaining Longevity: 77 mo
Battery Remaining Percentage: 86 %
Battery Voltage: 2.98 V
Brady Statistic AP VP Percent: 16 %
Brady Statistic AP VS Percent: 1 %
Brady Statistic AS VP Percent: 81 %
Brady Statistic AS VS Percent: 2.9 %
Brady Statistic RA Percent Paced: 16 %
Date Time Interrogation Session: 20240122010623
HighPow Impedance: 65 Ohm
Implantable Lead Connection Status: 753985
Implantable Lead Connection Status: 753985
Implantable Lead Connection Status: 753985
Implantable Lead Implant Date: 20230424
Implantable Lead Implant Date: 20230424
Implantable Lead Implant Date: 20230424
Implantable Lead Location: 753858
Implantable Lead Location: 753859
Implantable Lead Location: 753860
Implantable Pulse Generator Implant Date: 20230424
Lead Channel Impedance Value: 410 Ohm
Lead Channel Impedance Value: 530 Ohm
Lead Channel Impedance Value: 550 Ohm
Lead Channel Pacing Threshold Amplitude: 0.5 V
Lead Channel Pacing Threshold Amplitude: 0.75 V
Lead Channel Pacing Threshold Amplitude: 1 V
Lead Channel Pacing Threshold Pulse Width: 0.5 ms
Lead Channel Pacing Threshold Pulse Width: 0.5 ms
Lead Channel Pacing Threshold Pulse Width: 0.8 ms
Lead Channel Sensing Intrinsic Amplitude: 11.8 mV
Lead Channel Sensing Intrinsic Amplitude: 5 mV
Lead Channel Setting Pacing Amplitude: 1.5 V
Lead Channel Setting Pacing Amplitude: 2 V
Lead Channel Setting Pacing Amplitude: 2.5 V
Lead Channel Setting Pacing Pulse Width: 0.5 ms
Lead Channel Setting Pacing Pulse Width: 0.8 ms
Lead Channel Setting Sensing Sensitivity: 0.5 mV
Pulse Gen Serial Number: 210001616
Zone Setting Status: 755011

## 2023-01-10 NOTE — Telephone Encounter (Signed)
Mr. Verba called back and I relayed information to him.  He understood and will call lif anything changes.

## 2023-01-10 NOTE — Telephone Encounter (Signed)
Left message on machine Phone: [phone] [name])

## 2023-01-14 DIAGNOSIS — E7800 Pure hypercholesterolemia, unspecified: Secondary | ICD-10-CM | POA: Diagnosis not present

## 2023-01-14 DIAGNOSIS — I5022 Chronic systolic (congestive) heart failure: Secondary | ICD-10-CM | POA: Diagnosis not present

## 2023-01-14 DIAGNOSIS — I10 Essential (primary) hypertension: Secondary | ICD-10-CM | POA: Diagnosis not present

## 2023-01-14 DIAGNOSIS — I2510 Atherosclerotic heart disease of native coronary artery without angina pectoris: Secondary | ICD-10-CM | POA: Diagnosis not present

## 2023-01-14 DIAGNOSIS — K219 Gastro-esophageal reflux disease without esophagitis: Secondary | ICD-10-CM | POA: Diagnosis not present

## 2023-01-14 DIAGNOSIS — S065XAA Traumatic subdural hemorrhage with loss of consciousness status unknown, initial encounter: Secondary | ICD-10-CM | POA: Diagnosis not present

## 2023-01-14 DIAGNOSIS — E1169 Type 2 diabetes mellitus with other specified complication: Secondary | ICD-10-CM | POA: Diagnosis not present

## 2023-01-16 NOTE — Progress Notes (Signed)
ADVANCED HF CLINIC NOTE  Primary Care: Lawerance Cruel, MD Primary Cardiologist: Dr. Ellyn Hack HF Cardiologist: Dr. Haroldine Laws  HPI:  Anthony Alvarez is a 67 y.o. with CAD s/p CABG 2020, DM2, HL, ADHD previously on adderall,  LBBB and systolic HF due to iCM.     Admitted 8/20 with NSTEMI. LHC with severe 2v CAD with totalled LAD. LVEF at that time was 35 to 45%.  He underwent CABG with LIMA to LAD, SVG to PDA, SVG to PL 08/13/2019 with Dr. Kipp Brood.    In 2021 EF was up to 45-50%.    Admitted with 12/22 with ADHF. Cath with severe 2V CAD with CTO of proximal LAD and mild RCA. LIMA patent.  Chronically occluded SVG-rPDA and SVG-rPL. Echo EF 20-25%    Underwent CRT-D implant 4/23  Echo 8/23 EF 30-35%  Had a fall and hit head, seen in ED 12/09/22 and CT head showed subdural bleed. Neurosurgery felt it was stable and arranged follow up in 2 weeks. He returned to ED 12/14/22 with HA. Treated with amitriptyline and Vicodin, neuro follow up arranged.  Today he returns for HF follow up with wife. Feels generally better since CRT-D. Main issue is dizziness and balance issues (see recent ED visit above). He has SOB walking up steps.  Enjoys Proofreader. Feels swelling in legs and abdomen occasionally, now taking torsemide 1-2 tabs day. Denies palpitations, CP, or PND/Orthopnea. Appetite ok. No fever or chills. Weight at home 189 pounds. Taking all medications. Home BP ~120/70-80s, blood sugars ~ 130s, denies neuropathy symptoms. No further falls since 12/09/22, wants to de-escalate meds.     Cardiac Testing  - Echo 08/14/22: EF 30-35%  - Echo 12/22: EF 20-25% RV moderately reduced   - RHC/LHC 11/20/21 Severe two-vessel coronary artery disease with chronic total occlusions of proximal LAD and mid RCA.  There is also moderate diffuse disease of the distal LCx. Widely patent LIMA-D3, which backfills the mid and distal LAD. Chronically occluded SVG-rPDA and SVG-rPL. Mildly-moderately elevated left  heart filling pressure. Normal right heart filling pressure. Mildly reduced Fick cardiac output.   - Echo 1/22: EF 45-50%   Past Medical History:  Diagnosis Date   CHF (congestive heart failure) (HCC)    Chronic HFrEF (heart failure with reduced ejection fraction) ([place]) 11/17/2021   EF down to 20-25% with Acute Presentation of HFrEF - Global & LAD territory HK.   Coronary artery disease involving coronary bypass graft of native heart 08/12/2019   SVG-rPDA & SVG-RPL 100% occluded.   HLD (hyperlipidemia)    Hyperlipidemia associated with type 2 diabetes mellitus ([place]) 11/30/2019   Ischemic cardiomyopathy 11/30/2019   Post NSTEMI-CABG, EF increased from 35% to 45% => 11/2021 Acute on Chronic HFrEF -> EF downt to 20-25%. Cath with both SVGs to RPDA & PL along with native RCA CTO.  Patent LIMA-D3(LAD).  L-RPDA collaterals.  = titration of meds limited by Hypotension.   Multivessel CAD - 100% LAD (with Diag), 99% mRCA (subtotal occlusio) 08/12/2019   NSTEMI (non-ST elevated myocardial infarction) ([place]) 08/10/2019   S/P Off Pump CABG x 3 08/13/2019   LIMA to LAD RSVG to PDA RSVG to PLVB   Current Outpatient Medications  Medication Sig Dispense Refill   acetaminophen (TYLENOL) 500 MG tablet Take 500-1,000 mg by mouth every 8 (eight) hours as needed for mild pain.     ALPRAZolam (XANAX) 0.25 MG tablet Take 0.25 mg by mouth daily as needed for anxiety.     amphetamine-dextroamphetamine (  ADDERALL) 20 MG tablet Take 10 mg by mouth 4 (four) times daily as needed (focus).     aspirin EC 81 MG tablet Take 81 mg by mouth daily.     Cholecalciferol (VITAMIN D3) 50 MCG (2000 UT) capsule Take 2,000 Units by mouth daily.     FARXIGA 10 MG TABS tablet TAKE ONE TABLET BY MOUTH ONCE daily 30 tablet 5   glucose blood test strip TEST FOUR TIMES DAILY OR AS DIRECTED 100 each 1   Lancets (ONETOUCH ULTRASOFT) lancets Use as instructed 100 each 1   MAGNESIUM PO Take 250 mg by mouth daily.     Melatonin 10 MG  CAPS Take 10 mg by mouth at bedtime as needed (sleep).     Menaquinone-7 (K2 PO) Take 100 mg by mouth daily.     Multiple Vitamin (MULTI-VITAMIN) tablet Take 1 tablet by mouth daily.     Potassium 99 MG TABS Patient takes 1/2 tablet by mouth once a day.     REPATHA SURECLICK 295 MG/ML SOAJ Inject 140mg  into THE SKIN every 14 DAYS 6 mL 3   sacubitril-valsartan (ENTRESTO) 24-26 MG TAKE ONE TABLET BY MOUTH TWICE DAILY 60 tablet 11   torsemide (DEMADEX) 20 MG tablet TAKE 1 OR 2 TABLETS BY MOUTH ONCE DAILY 180 tablet 1   No current facility-administered medications for this encounter.   Allergies  Allergen Reactions   Atorvastatin Other (See Comments)    Memory deficit, fatigue, myalgias, insomnia; similar symptoms with rosuvastatin.   Icosapent Ethyl     lethargy   Rosuvastatin Other (See Comments)    Lethargic, fatigue   Social History   Socioeconomic History   Marital status: Married    Spouse name: Not on file   Number of children: Not on file   Years of education: Not on file   Highest education level: Not on file  Occupational History   Not on file  Tobacco Use   Smoking status: Former    Types: Cigars   Smokeless tobacco: Never  Vaping Use   Vaping Use: Never used  Substance and Sexual Activity   Alcohol use: Not Currently    Comment: not in the past 6 months   Drug use: Not Currently   Sexual activity: Not on file  Other Topics Concern   Not on file  Social History Narrative   Not on file   Social Determinants of Health   Financial Resource Strain: Low Risk  (11/27/2021)   Overall Financial Resource Strain (CARDIA)    Difficulty of Paying Living Expenses: Not very hard  Food Insecurity: No Food Insecurity (11/27/2021)   Hunger Vital Sign    Worried About Running Out of Food in the Last Year: Never true    [place] in the Last Year: Never true  Transportation Needs: No Transportation Needs (11/27/2021)   PRAPARE - Radiographer, therapeutic (Medical): No    Lack of Transportation (Non-Medical): No  Physical Activity: Not on file  Stress: Not on file  Social Connections: Not on file  Intimate Partner Violence: Not on file    No family history on file.  BP 130/80   Pulse 72   Wt 89.4 kg (197 lb)   SpO2 100%   BMI 28.27 kg/m   Wt Readings from Last 3 Encounters:  01/18/23 89.4 kg (197 lb)  12/14/22 84.8 kg (187 lb)  12/09/22 84.4 kg (186 lb)   PHYSICAL EXAM: General:  NAD. No  resp difficulty, walked into clinic HEENT: Normal Neck: Supple. No JVD. Carotids 2+ bilat; no bruits. No lymphadenopathy or thryomegaly appreciated. Cor: PMI nondisplaced. Regular rate & rhythm. No rubs, gallops or murmurs. Lungs: Clear Abdomen: Soft, nontender, nondistended. No hepatosplenomegaly. No bruits or masses. Good bowel sounds. Extremities: No cyanosis, clubbing, rash, edema Neuro: Alert & oriented x 3, cranial nerves grossly intact. Moves all 4 extremities w/o difficulty. Affect pleasant.  ICD interrogation: unable to interrogate device in clinic.  ReDs: 29%  ECG (personally reviewed): NSR, BiV pacing  ASSESSMENT & PLAN:  1. Chronic HFrEF due to iCM +/- LBBB - Echo 2021 EF 45-50%.  - Echo 12/22 EF 20-25% - cMRI 3/23 EF 19% extensive scar  - Echo 08/14/22 EF 30-35% - Wide LBBB on ECG s/p STJ CRT-D 4/23 with Dr. Quentin Ore - NYHA III, volume looks ok, REDs 29 %. GDMT limited by what sounds like orthostasis. - Place compression hose. - Decrease torsemide to 10 mg daily. - Continue carvedilol 6.25 mg bid  - Continue Entresto 24-26 mg bid. Consider switch to losartan next if orthostasis persists. - Continue Farxiga 10 mg daily. - Does not want spiro or eplerenone d/t side effects. No change. - Repeat echo to ensure EF stable. - Labs today.  2. LBBB - Suspect LBBB CM. - s/p STJ CRT-D 4/23 with Dr. Quentin Ore   3. CAD - S/P CABG 2021 - LHC 11/2021 severe 2V CAD with CTO of proximal LAD and mild RCA . LIMA  patent. + chronically occluded SVG-rPDA and SVG-rPL. - No chest pain. - Intolerant statin. On Repatha.  - Continue ASA + beta blocker.    4. DMII - Continue Farxiga. - Consider GLP1RA.   5. ADHD - OK to continue Adderall, takes 4x/day, not tachycardic on exam  6. Snoring - Can consider sleep study down the road.   7. Subdural hematoma - No intervention required - Had follow up with Neurosurgery, Dr. Marcello Moores. Did not feel dizziness r/t SDH  8. Dizziness - Sounds orthostatic.  - Place compression hose as above. - Discussed fall precautions. - Check labs today.  Follow up in 3 months with Dr. Joyce Gross, FNP-BC 01/18/23

## 2023-01-18 ENCOUNTER — Encounter (HOSPITAL_COMMUNITY): Payer: Self-pay

## 2023-01-18 ENCOUNTER — Ambulatory Visit (HOSPITAL_COMMUNITY)
Admission: RE | Admit: 2023-01-18 | Discharge: 2023-01-18 | Disposition: A | Discharge status: Home / Self Care | Payer: Medicare Other | Source: Ambulatory Visit | Attending: Family Medicine | Admitting: Family Medicine

## 2023-01-18 VITALS — BP 130/80 | HR 72 | Wt 197.0 lb

## 2023-01-18 DIAGNOSIS — X58XXXS Exposure to other specified factors, sequela: Secondary | ICD-10-CM | POA: Diagnosis not present

## 2023-01-18 DIAGNOSIS — Z95810 Presence of automatic (implantable) cardiac defibrillator: Secondary | ICD-10-CM

## 2023-01-18 DIAGNOSIS — Z8679 Personal history of other diseases of the circulatory system: Secondary | ICD-10-CM | POA: Diagnosis not present

## 2023-01-18 DIAGNOSIS — I5022 Chronic systolic (congestive) heart failure: Secondary | ICD-10-CM | POA: Insufficient documentation

## 2023-01-18 DIAGNOSIS — S065XAS Traumatic subdural hemorrhage with loss of consciousness status unknown, sequela: Secondary | ICD-10-CM | POA: Insufficient documentation

## 2023-01-18 DIAGNOSIS — E1169 Type 2 diabetes mellitus with other specified complication: Secondary | ICD-10-CM | POA: Diagnosis not present

## 2023-01-18 DIAGNOSIS — R42 Dizziness and giddiness: Secondary | ICD-10-CM | POA: Insufficient documentation

## 2023-01-18 DIAGNOSIS — I447 Left bundle-branch block, unspecified: Secondary | ICD-10-CM | POA: Insufficient documentation

## 2023-01-18 DIAGNOSIS — E119 Type 2 diabetes mellitus without complications: Secondary | ICD-10-CM | POA: Insufficient documentation

## 2023-01-18 DIAGNOSIS — Z79899 Other long term (current) drug therapy: Secondary | ICD-10-CM | POA: Insufficient documentation

## 2023-01-18 DIAGNOSIS — F909 Attention-deficit hyperactivity disorder, unspecified type: Secondary | ICD-10-CM | POA: Diagnosis not present

## 2023-01-18 DIAGNOSIS — R0683 Snoring: Secondary | ICD-10-CM

## 2023-01-18 DIAGNOSIS — I2510 Atherosclerotic heart disease of native coronary artery without angina pectoris: Secondary | ICD-10-CM | POA: Insufficient documentation

## 2023-01-18 DIAGNOSIS — I252 Old myocardial infarction: Secondary | ICD-10-CM | POA: Diagnosis not present

## 2023-01-18 DIAGNOSIS — I25810 Atherosclerosis of coronary artery bypass graft(s) without angina pectoris: Secondary | ICD-10-CM | POA: Diagnosis not present

## 2023-01-18 DIAGNOSIS — I25118 Atherosclerotic heart disease of native coronary artery with other forms of angina pectoris: Secondary | ICD-10-CM | POA: Diagnosis not present

## 2023-01-18 LAB — COMPREHENSIVE METABOLIC PANEL
ALT: 12 U/L (ref 0–44)
AST: 21 U/L (ref 15–41)
Albumin: 3.9 g/dL (ref 3.5–5.0)
Alkaline Phosphatase: 45 U/L (ref 38–126)
Anion gap: 9 (ref 5–15)
BUN: 20 mg/dL (ref 8–23)
CO2: 29 mmol/L (ref 22–32)
Calcium: 9.4 mg/dL (ref 8.9–10.3)
Chloride: 100 mmol/L (ref 98–111)
Creatinine, Ser: 0.94 mg/dL (ref 0.61–1.24)
GFR, Estimated: >60 mL/min (ref >60)
Glucose, Bld: 123 mg/dL — ABNORMAL HIGH (ref 70–99)
Potassium: 3.9 mmol/L (ref 3.5–5.1)
Sodium: 138 mmol/L (ref 135–145)
Total Bilirubin: 1.4 mg/dL — ABNORMAL HIGH (ref 0.3–1.2)
Total Protein: 6.1 g/dL — ABNORMAL LOW (ref 6.5–8.1)

## 2023-01-18 LAB — CBC
HCT: 46.9 % (ref 39.0–52.0)
Hemoglobin: 16.3 g/dL (ref 13.0–17.0)
MCH: 31.8 pg (ref 26.0–34.0)
MCHC: 34.8 g/dL (ref 30.0–36.0)
MCV: 91.4 fL (ref 80.0–100.0)
Platelets: 226 10*3/uL (ref 150–400)
RBC: 5.13 MIL/uL (ref 4.22–5.81)
RDW: 12.3 % (ref 11.5–15.5)
WBC: 10 10*3/uL (ref 4.0–10.5)
nRBC: 0 % (ref 0.0–0.2)

## 2023-01-18 LAB — BRAIN NATRIURETIC PEPTIDE: B Natriuretic Peptide: 58.8 pg/mL (ref 0.0–100.0)

## 2023-01-18 MED ORDER — TORSEMIDE 20 MG PO TABS: 10.0000 mg | ORAL_TABLET | Freq: Every day | ORAL | 3 refills | Status: AC

## 2023-01-18 NOTE — Patient Instructions (Signed)
Thank you for coming in today   Labs were done today, if any labs are abnormal the clinic will call you No news is good news  Please wear compression hose  CHANGE Torsemide to 10 mg 1/2 tablet daily   Your physician recommends that you schedule a follow-up appointment in:  3-4 months with Dr. Haroldine Laws   Your physician has requested that you have an echocardiogram. Echocardiography is a painless test that uses sound waves to create images of your heart. It provides your doctor with information about the size and shape of your heart and how well your heart's chambers and valves are working. This procedure takes approximately one hour. There are no restrictions for this procedure.     Do the following things EVERYDAY: Weigh yourself in the morning before breakfast. Write it down and keep it in a log. Take your medicines as prescribed Eat low salt foods--Limit salt (sodium) to 2000 mg per day.  Stay as active as you can everyday Limit all fluids for the day to less than 2 liters  At the [facility], you and your health needs are our priority. As part of our continuing mission to provide you with exceptional heart care, we have created designated Provider Care Teams. These Care Teams include your primary Cardiologist (physician) and Advanced Practice Providers (APPs- Physician Assistants and Nurse Practitioners) who all work together to provide you with the care you need, when you need it.   You may see any of the following providers on your designated Care Team at your next follow up: Dr Glori Bickers Dr Loralie Champagne Dr. Roxana Hires, NP Lyda Jester, [place] [facility] [place], [place] Forestine Na, NP Audry Riles, PharmD   Please be sure to bring in all your medications bottles to every appointment.    Thank you for choosing [facility]   If you have any questions or concerns before your next  appointment please send [place] a message through [place] or call our office at [phone].    TO LEAVE A MESSAGE FOR THE NURSE SELECT OPTION 2, PLEASE LEAVE A MESSAGE INCLUDING: YOUR NAME DATE OF BIRTH CALL BACK NUMBER REASON FOR CALL**this is important as we prioritize the call backs  YOU WILL RECEIVE A CALL BACK THE SAME DAY AS LONG AS YOU CALL BEFORE 4:00 PM

## 2023-01-18 NOTE — Progress Notes (Signed)
ReDS Vest / Clip - 01/18/23 0920       ReDS Vest / Clip   Station Marker D    Ruler Value 33    ReDS Value Range Low volume    ReDS Actual Value 29    Anatomical Comments sitting

## 2023-02-18 DIAGNOSIS — R944 Abnormal results of kidney function studies: Secondary | ICD-10-CM | POA: Diagnosis not present

## 2023-02-18 DIAGNOSIS — E1169 Type 2 diabetes mellitus with other specified complication: Secondary | ICD-10-CM | POA: Diagnosis not present

## 2023-02-18 LAB — LAB REPORT - SCANNED
A1c: 5.7
EGFR: 81

## 2023-02-19 ENCOUNTER — Ambulatory Visit (HOSPITAL_COMMUNITY)
Admission: RE | Admit: 2023-02-19 | Discharge: 2023-02-19 | Disposition: A | Discharge status: Home / Self Care | Payer: Medicare Other | Source: Ambulatory Visit | Attending: Family Medicine | Admitting: Family Medicine

## 2023-02-19 DIAGNOSIS — I2510 Atherosclerotic heart disease of native coronary artery without angina pectoris: Secondary | ICD-10-CM | POA: Diagnosis not present

## 2023-02-19 DIAGNOSIS — I509 Heart failure, unspecified: Secondary | ICD-10-CM | POA: Insufficient documentation

## 2023-02-19 DIAGNOSIS — E785 Hyperlipidemia, unspecified: Secondary | ICD-10-CM | POA: Insufficient documentation

## 2023-02-19 DIAGNOSIS — Z951 Presence of aortocoronary bypass graft: Secondary | ICD-10-CM | POA: Insufficient documentation

## 2023-02-19 DIAGNOSIS — E1169 Type 2 diabetes mellitus with other specified complication: Secondary | ICD-10-CM | POA: Diagnosis not present

## 2023-02-19 DIAGNOSIS — I5022 Chronic systolic (congestive) heart failure: Secondary | ICD-10-CM | POA: Diagnosis not present

## 2023-02-19 DIAGNOSIS — I252 Old myocardial infarction: Secondary | ICD-10-CM | POA: Insufficient documentation

## 2023-02-19 DIAGNOSIS — K219 Gastro-esophageal reflux disease without esophagitis: Secondary | ICD-10-CM | POA: Diagnosis not present

## 2023-02-19 DIAGNOSIS — I10 Essential (primary) hypertension: Secondary | ICD-10-CM | POA: Diagnosis not present

## 2023-02-19 DIAGNOSIS — E119 Type 2 diabetes mellitus without complications: Secondary | ICD-10-CM | POA: Insufficient documentation

## 2023-02-19 DIAGNOSIS — E7800 Pure hypercholesterolemia, unspecified: Secondary | ICD-10-CM | POA: Diagnosis not present

## 2023-02-19 LAB — ECHOCARDIOGRAM COMPLETE
Area-P 1/2: 3.99 cm2
Calc EF: 39.8 %
S' Lateral: 5.1 cm
Single Plane A2C EF: 45.9 %
Single Plane A4C EF: 37.5 %

## 2023-02-19 NOTE — Progress Notes (Signed)
Echocardiogram 2D Echocardiogram has been performed.  Anthony Alvarez 02/19/2023, 1:51 PM

## 2023-02-21 DIAGNOSIS — Z0000 Encounter for general adult medical examination without abnormal findings: Secondary | ICD-10-CM | POA: Diagnosis not present

## 2023-02-21 DIAGNOSIS — E119 Type 2 diabetes mellitus without complications: Secondary | ICD-10-CM | POA: Diagnosis not present

## 2023-02-21 DIAGNOSIS — I10 Essential (primary) hypertension: Secondary | ICD-10-CM | POA: Diagnosis not present

## 2023-02-22 ENCOUNTER — Other Ambulatory Visit: Payer: Self-pay | Admitting: Family Medicine

## 2023-02-22 DIAGNOSIS — E041 Nontoxic single thyroid nodule: Secondary | ICD-10-CM

## 2023-02-25 NOTE — Progress Notes (Signed)
Remote ICD transmission.   

## 2023-03-21 DIAGNOSIS — E7800 Pure hypercholesterolemia, unspecified: Secondary | ICD-10-CM | POA: Diagnosis not present

## 2023-03-21 DIAGNOSIS — I10 Essential (primary) hypertension: Secondary | ICD-10-CM | POA: Diagnosis not present

## 2023-03-21 DIAGNOSIS — I5022 Chronic systolic (congestive) heart failure: Secondary | ICD-10-CM | POA: Diagnosis not present

## 2023-03-21 DIAGNOSIS — I2510 Atherosclerotic heart disease of native coronary artery without angina pectoris: Secondary | ICD-10-CM | POA: Diagnosis not present

## 2023-03-21 DIAGNOSIS — E1169 Type 2 diabetes mellitus with other specified complication: Secondary | ICD-10-CM | POA: Diagnosis not present

## 2023-03-21 DIAGNOSIS — K219 Gastro-esophageal reflux disease without esophagitis: Secondary | ICD-10-CM | POA: Diagnosis not present

## 2023-03-25 ENCOUNTER — Ambulatory Visit
Admission: RE | Admit: 2023-03-25 | Discharge: 2023-03-25 | Disposition: A | Discharge status: Home / Self Care | Payer: Medicare Other | Source: Ambulatory Visit | Attending: Family Medicine | Admitting: Family Medicine

## 2023-03-25 DIAGNOSIS — E041 Nontoxic single thyroid nodule: Secondary | ICD-10-CM | POA: Diagnosis not present

## 2023-04-08 ENCOUNTER — Ambulatory Visit (INDEPENDENT_AMBULATORY_CARE_PROVIDER_SITE_OTHER): Payer: Medicare Other

## 2023-04-08 DIAGNOSIS — I255 Ischemic cardiomyopathy: Secondary | ICD-10-CM

## 2023-04-08 DIAGNOSIS — I5022 Chronic systolic (congestive) heart failure: Secondary | ICD-10-CM | POA: Diagnosis not present

## 2023-04-09 LAB — CUP PACEART REMOTE DEVICE CHECK
Battery Remaining Longevity: 74 mo
Battery Remaining Percentage: 83 %
Battery Voltage: 2.98 V
Brady Statistic AP VP Percent: 15 %
Brady Statistic AP VS Percent: 1 %
Brady Statistic AS VP Percent: 82 %
Brady Statistic AS VS Percent: 2.7 %
Brady Statistic RA Percent Paced: 15 %
Date Time Interrogation Session: 20240422030655
HighPow Impedance: 69 Ohm
Implantable Lead Connection Status: 753985
Implantable Lead Connection Status: 753985
Implantable Lead Connection Status: 753985
Implantable Lead Implant Date: 20230424
Implantable Lead Implant Date: 20230424
Implantable Lead Implant Date: 20230424
Implantable Lead Location: 753858
Implantable Lead Location: 753859
Implantable Lead Location: 753860
Implantable Pulse Generator Implant Date: 20230424
Lead Channel Impedance Value: 430 Ohm
Lead Channel Impedance Value: 510 Ohm
Lead Channel Impedance Value: 540 Ohm
Lead Channel Pacing Threshold Amplitude: 0.5 V
Lead Channel Pacing Threshold Amplitude: 0.75 V
Lead Channel Pacing Threshold Amplitude: 1 V
Lead Channel Pacing Threshold Pulse Width: 0.5 ms
Lead Channel Pacing Threshold Pulse Width: 0.5 ms
Lead Channel Pacing Threshold Pulse Width: 0.8 ms
Lead Channel Sensing Intrinsic Amplitude: 11.8 mV
Lead Channel Sensing Intrinsic Amplitude: 5 mV
Lead Channel Setting Pacing Amplitude: 1.5 V
Lead Channel Setting Pacing Amplitude: 2 V
Lead Channel Setting Pacing Amplitude: 2.5 V
Lead Channel Setting Pacing Pulse Width: 0.5 ms
Lead Channel Setting Pacing Pulse Width: 0.8 ms
Lead Channel Setting Sensing Sensitivity: 0.5 mV
Pulse Gen Serial Number: 210001616
Zone Setting Status: 755011

## 2023-05-15 NOTE — Progress Notes (Signed)
Remote ICD transmission.   

## 2023-07-08 ENCOUNTER — Ambulatory Visit (INDEPENDENT_AMBULATORY_CARE_PROVIDER_SITE_OTHER): Payer: Medicare Other

## 2023-07-08 DIAGNOSIS — I255 Ischemic cardiomyopathy: Secondary | ICD-10-CM | POA: Diagnosis not present

## 2023-07-09 LAB — CUP PACEART REMOTE DEVICE CHECK
Battery Remaining Longevity: 71 mo
Battery Remaining Percentage: 81 %
Battery Voltage: 2.96 V
Brady Statistic AP VP Percent: 16 %
Brady Statistic AP VS Percent: 1 %
Brady Statistic AS VP Percent: 82 %
Brady Statistic AS VS Percent: 2.4 %
Brady Statistic RA Percent Paced: 16 %
Date Time Interrogation Session: 20240722021037
HighPow Impedance: 64 Ohm
Implantable Lead Connection Status: 753985
Implantable Lead Connection Status: 753985
Implantable Lead Connection Status: 753985
Implantable Lead Implant Date: 20230424
Implantable Lead Implant Date: 20230424
Implantable Lead Implant Date: 20230424
Implantable Lead Location: 753858
Implantable Lead Location: 753859
Implantable Lead Location: 753860
Implantable Pulse Generator Implant Date: 20230424
Lead Channel Impedance Value: 430 Ohm
Lead Channel Impedance Value: 480 Ohm
Lead Channel Impedance Value: 510 Ohm
Lead Channel Pacing Threshold Amplitude: 0.5 V
Lead Channel Pacing Threshold Amplitude: 0.75 V
Lead Channel Pacing Threshold Amplitude: 1 V
Lead Channel Pacing Threshold Pulse Width: 0.5 ms
Lead Channel Pacing Threshold Pulse Width: 0.5 ms
Lead Channel Pacing Threshold Pulse Width: 0.8 ms
Lead Channel Sensing Intrinsic Amplitude: 11.8 mV
Lead Channel Sensing Intrinsic Amplitude: 5 mV
Lead Channel Setting Pacing Amplitude: 1.5 V
Lead Channel Setting Pacing Amplitude: 2 V
Lead Channel Setting Pacing Amplitude: 2.5 V
Lead Channel Setting Pacing Pulse Width: 0.5 ms
Lead Channel Setting Pacing Pulse Width: 0.8 ms
Lead Channel Setting Sensing Sensitivity: 0.5 mV
Pulse Gen Serial Number: 210001616
Zone Setting Status: 755011

## 2023-07-26 NOTE — Progress Notes (Signed)
Remote ICD transmission.   

## 2023-08-29 DIAGNOSIS — E1169 Type 2 diabetes mellitus with other specified complication: Secondary | ICD-10-CM | POA: Diagnosis not present

## 2023-08-29 DIAGNOSIS — R9389 Abnormal findings on diagnostic imaging of other specified body structures: Secondary | ICD-10-CM | POA: Diagnosis not present

## 2023-08-29 DIAGNOSIS — E041 Nontoxic single thyroid nodule: Secondary | ICD-10-CM | POA: Diagnosis not present

## 2023-08-29 DIAGNOSIS — L308 Other specified dermatitis: Secondary | ICD-10-CM | POA: Diagnosis not present

## 2023-08-29 DIAGNOSIS — E119 Type 2 diabetes mellitus without complications: Secondary | ICD-10-CM | POA: Diagnosis not present

## 2023-08-29 DIAGNOSIS — Z95810 Presence of automatic (implantable) cardiac defibrillator: Secondary | ICD-10-CM | POA: Diagnosis not present

## 2023-08-29 DIAGNOSIS — I10 Essential (primary) hypertension: Secondary | ICD-10-CM | POA: Diagnosis not present

## 2023-08-29 LAB — LAB REPORT - SCANNED
A1c: 5.6
Albumin, Urine POC: <0.7
Albumin/Creatinine Ratio, Urine, POC: <8.4
Creatinine, POC: 84 mg/dL
EGFR: 93

## 2023-09-12 DIAGNOSIS — Z950 Presence of cardiac pacemaker: Secondary | ICD-10-CM | POA: Diagnosis not present

## 2023-09-16 DIAGNOSIS — R9389 Abnormal findings on diagnostic imaging of other specified body structures: Secondary | ICD-10-CM | POA: Diagnosis not present

## 2023-10-07 ENCOUNTER — Ambulatory Visit: Payer: Medicare Other

## 2023-10-07 DIAGNOSIS — I255 Ischemic cardiomyopathy: Secondary | ICD-10-CM

## 2023-10-08 LAB — CUP PACEART REMOTE DEVICE CHECK
Battery Remaining Longevity: 68 mo
Battery Remaining Percentage: 77 %
Battery Voltage: 2.96 V
Brady Statistic AP VP Percent: 16 %
Brady Statistic AP VS Percent: 1 %
Brady Statistic AS VP Percent: 82 %
Brady Statistic AS VS Percent: 2.3 %
Brady Statistic RA Percent Paced: 16 %
Date Time Interrogation Session: 20241022075810
HighPow Impedance: 66 Ohm
Implantable Lead Connection Status: 753985
Implantable Lead Connection Status: 753985
Implantable Lead Connection Status: 753985
Implantable Lead Implant Date: 20230424
Implantable Lead Implant Date: 20230424
Implantable Lead Implant Date: 20230424
Implantable Lead Location: 753858
Implantable Lead Location: 753859
Implantable Lead Location: 753860
Implantable Pulse Generator Implant Date: 20230424
Lead Channel Impedance Value: 430 Ohm
Lead Channel Impedance Value: 480 Ohm
Lead Channel Impedance Value: 500 Ohm
Lead Channel Pacing Threshold Amplitude: 0.5 V
Lead Channel Pacing Threshold Amplitude: 0.75 V
Lead Channel Pacing Threshold Amplitude: 0.75 V
Lead Channel Pacing Threshold Pulse Width: 0.5 ms
Lead Channel Pacing Threshold Pulse Width: 0.5 ms
Lead Channel Pacing Threshold Pulse Width: 0.8 ms
Lead Channel Sensing Intrinsic Amplitude: 11.8 mV
Lead Channel Sensing Intrinsic Amplitude: 5 mV
Lead Channel Setting Pacing Amplitude: 1.5 V
Lead Channel Setting Pacing Amplitude: 2 V
Lead Channel Setting Pacing Amplitude: 2.5 V
Lead Channel Setting Pacing Pulse Width: 0.5 ms
Lead Channel Setting Pacing Pulse Width: 0.8 ms
Lead Channel Setting Sensing Sensitivity: 0.5 mV
Pulse Gen Serial Number: 210001616
Zone Setting Status: 755011

## 2023-10-24 NOTE — Progress Notes (Signed)
Remote ICD transmission.   

## 2023-12-28 ENCOUNTER — Other Ambulatory Visit (HOSPITAL_COMMUNITY): Payer: Self-pay | Admitting: Internal Medicine

## 2024-01-06 ENCOUNTER — Ambulatory Visit (INDEPENDENT_AMBULATORY_CARE_PROVIDER_SITE_OTHER): Payer: Medicare Other

## 2024-01-06 DIAGNOSIS — I255 Ischemic cardiomyopathy: Secondary | ICD-10-CM

## 2024-01-06 DIAGNOSIS — I5022 Chronic systolic (congestive) heart failure: Secondary | ICD-10-CM

## 2024-01-06 LAB — CUP PACEART REMOTE DEVICE CHECK
Battery Remaining Longevity: 64 mo
Battery Remaining Percentage: 74 %
Battery Voltage: 2.96 V
Brady Statistic AP VP Percent: 16 %
Brady Statistic AP VS Percent: 1 %
Brady Statistic AS VP Percent: 81 %
Brady Statistic AS VS Percent: 2 %
Brady Statistic RA Percent Paced: 16 %
Date Time Interrogation Session: 20250120012657
HighPow Impedance: 68 Ohm
Implantable Lead Connection Status: 753985
Implantable Lead Connection Status: 753985
Implantable Lead Connection Status: 753985
Implantable Lead Implant Date: 20230424
Implantable Lead Implant Date: 20230424
Implantable Lead Implant Date: 20230424
Implantable Lead Location: 753858
Implantable Lead Location: 753859
Implantable Lead Location: 753860
Implantable Pulse Generator Implant Date: 20230424
Lead Channel Impedance Value: 390 Ohm
Lead Channel Impedance Value: 440 Ohm
Lead Channel Impedance Value: 480 Ohm
Lead Channel Pacing Threshold Amplitude: 0.5 V
Lead Channel Pacing Threshold Amplitude: 0.75 V
Lead Channel Pacing Threshold Amplitude: 0.75 V
Lead Channel Pacing Threshold Pulse Width: 0.5 ms
Lead Channel Pacing Threshold Pulse Width: 0.5 ms
Lead Channel Pacing Threshold Pulse Width: 0.8 ms
Lead Channel Sensing Intrinsic Amplitude: 10.6 mV
Lead Channel Sensing Intrinsic Amplitude: 5 mV
Lead Channel Setting Pacing Amplitude: 1.5 V
Lead Channel Setting Pacing Amplitude: 2 V
Lead Channel Setting Pacing Amplitude: 2.5 V
Lead Channel Setting Pacing Pulse Width: 0.5 ms
Lead Channel Setting Pacing Pulse Width: 0.8 ms
Lead Channel Setting Sensing Sensitivity: 0.5 mV
Pulse Gen Serial Number: 210001616
Zone Setting Status: 755011

## 2024-01-07 ENCOUNTER — Encounter: Payer: Self-pay | Admitting: Cardiology

## 2024-02-02 ENCOUNTER — Other Ambulatory Visit (HOSPITAL_COMMUNITY): Payer: Self-pay | Admitting: Internal Medicine

## 2024-02-13 DIAGNOSIS — I152 Hypertension secondary to endocrine disorders: Secondary | ICD-10-CM | POA: Diagnosis not present

## 2024-02-13 DIAGNOSIS — Z01818 Encounter for other preprocedural examination: Secondary | ICD-10-CM | POA: Diagnosis not present

## 2024-02-13 DIAGNOSIS — I5022 Chronic systolic (congestive) heart failure: Secondary | ICD-10-CM | POA: Diagnosis not present

## 2024-02-13 DIAGNOSIS — E119 Type 2 diabetes mellitus without complications: Secondary | ICD-10-CM | POA: Diagnosis not present

## 2024-02-13 NOTE — Progress Notes (Signed)
 Remote ICD transmission.

## 2024-02-16 DIAGNOSIS — I498 Other specified cardiac arrhythmias: Secondary | ICD-10-CM | POA: Diagnosis not present

## 2024-02-16 DIAGNOSIS — Z01810 Encounter for preprocedural cardiovascular examination: Secondary | ICD-10-CM | POA: Diagnosis not present

## 2024-02-27 DIAGNOSIS — E7800 Pure hypercholesterolemia, unspecified: Secondary | ICD-10-CM | POA: Diagnosis not present

## 2024-02-27 DIAGNOSIS — G720 Drug-induced myopathy: Secondary | ICD-10-CM | POA: Diagnosis not present

## 2024-02-27 DIAGNOSIS — I5022 Chronic systolic (congestive) heart failure: Secondary | ICD-10-CM | POA: Diagnosis not present

## 2024-02-27 DIAGNOSIS — E1169 Type 2 diabetes mellitus with other specified complication: Secondary | ICD-10-CM | POA: Diagnosis not present

## 2024-02-27 DIAGNOSIS — I10 Essential (primary) hypertension: Secondary | ICD-10-CM | POA: Diagnosis not present

## 2024-02-27 DIAGNOSIS — M545 Low back pain, unspecified: Secondary | ICD-10-CM | POA: Diagnosis not present

## 2024-02-27 DIAGNOSIS — Z0000 Encounter for general adult medical examination without abnormal findings: Secondary | ICD-10-CM | POA: Diagnosis not present

## 2024-03-16 DIAGNOSIS — M5137 Other intervertebral disc degeneration, lumbosacral region with discogenic back pain only: Secondary | ICD-10-CM | POA: Diagnosis not present

## 2024-03-16 DIAGNOSIS — M9903 Segmental and somatic dysfunction of lumbar region: Secondary | ICD-10-CM | POA: Diagnosis not present

## 2024-03-16 DIAGNOSIS — M5136 Other intervertebral disc degeneration, lumbar region with discogenic back pain only: Secondary | ICD-10-CM | POA: Diagnosis not present

## 2024-03-17 DIAGNOSIS — M5137 Other intervertebral disc degeneration, lumbosacral region with discogenic back pain only: Secondary | ICD-10-CM | POA: Diagnosis not present

## 2024-03-17 DIAGNOSIS — M9903 Segmental and somatic dysfunction of lumbar region: Secondary | ICD-10-CM | POA: Diagnosis not present

## 2024-03-17 DIAGNOSIS — M5136 Other intervertebral disc degeneration, lumbar region with discogenic back pain only: Secondary | ICD-10-CM | POA: Diagnosis not present

## 2024-03-18 DIAGNOSIS — M5137 Other intervertebral disc degeneration, lumbosacral region with discogenic back pain only: Secondary | ICD-10-CM | POA: Diagnosis not present

## 2024-03-18 DIAGNOSIS — M5136 Other intervertebral disc degeneration, lumbar region with discogenic back pain only: Secondary | ICD-10-CM | POA: Diagnosis not present

## 2024-03-18 DIAGNOSIS — M9903 Segmental and somatic dysfunction of lumbar region: Secondary | ICD-10-CM | POA: Diagnosis not present

## 2024-03-19 ENCOUNTER — Telehealth: Payer: Self-pay

## 2024-03-19 DIAGNOSIS — M5136 Other intervertebral disc degeneration, lumbar region with discogenic back pain only: Secondary | ICD-10-CM | POA: Diagnosis not present

## 2024-03-19 DIAGNOSIS — M5137 Other intervertebral disc degeneration, lumbosacral region with discogenic back pain only: Secondary | ICD-10-CM | POA: Diagnosis not present

## 2024-03-19 DIAGNOSIS — M9903 Segmental and somatic dysfunction of lumbar region: Secondary | ICD-10-CM | POA: Diagnosis not present

## 2024-03-19 NOTE — Telephone Encounter (Signed)
 Spoke with patient.  He has had some major life stressors (including the death of his son). In addition, he does report progressing SOB on exertion, more fatigue and over all decreased stamina due to being more sedentary over the last year.  He agrees with appt to come in.  Need to try to get him in before his procedure on 4/22.

## 2024-03-19 NOTE — Telephone Encounter (Signed)
 Alert remote transmission: VT in the monitor zone Event occurred 4/2 @ 17:07, duration 14sec per device, HR 179, Event triggered by PVC, self terminated HR's trending up, Corvue trending down Event EGM ? AVNRT?[name]    Patient is overdue for in clinic check, also scheduled for surgery on 04/07/24. Hasn't been seen in clinic since his 91 day post implant on 07/09/22.   LM for patient to call back and assess symptoms for fast heart rhythm. Need also to forward to EP scheduling to set up appt with APP or Dr. Lalla Brothers for eval soon.

## 2024-03-20 ENCOUNTER — Ambulatory Visit: Attending: Internal Medicine | Admitting: Student

## 2024-03-20 ENCOUNTER — Encounter: Payer: Self-pay | Admitting: Student

## 2024-03-20 VITALS — BP 114/60 | HR 72 | Resp 16 | Ht 70.0 in | Wt 210.0 lb

## 2024-03-20 DIAGNOSIS — I255 Ischemic cardiomyopathy: Secondary | ICD-10-CM | POA: Diagnosis not present

## 2024-03-20 DIAGNOSIS — Z95810 Presence of automatic (implantable) cardiac defibrillator: Secondary | ICD-10-CM

## 2024-03-20 DIAGNOSIS — I447 Left bundle-branch block, unspecified: Secondary | ICD-10-CM | POA: Diagnosis not present

## 2024-03-20 DIAGNOSIS — I5022 Chronic systolic (congestive) heart failure: Secondary | ICD-10-CM | POA: Diagnosis not present

## 2024-03-20 DIAGNOSIS — I25118 Atherosclerotic heart disease of native coronary artery with other forms of angina pectoris: Secondary | ICD-10-CM | POA: Diagnosis not present

## 2024-03-20 DIAGNOSIS — I10 Essential (primary) hypertension: Secondary | ICD-10-CM | POA: Diagnosis not present

## 2024-03-20 LAB — CUP PACEART INCLINIC DEVICE CHECK
Date Time Interrogation Session: 20250404132950
Implantable Lead Connection Status: 753985
Implantable Lead Connection Status: 753985
Implantable Lead Connection Status: 753985
Implantable Lead Implant Date: 20230424
Implantable Lead Implant Date: 20230424
Implantable Lead Implant Date: 20230424
Implantable Lead Location: 753858
Implantable Lead Location: 753859
Implantable Lead Location: 753860
Implantable Pulse Generator Implant Date: 20230424
Pulse Gen Serial Number: 210001616

## 2024-03-20 NOTE — Progress Notes (Signed)
  Electrophysiology Office Note:   ID:  Layth Cerezo, [place] Me[place] [birth year], MRN [id number]  Primary Cardiologist: [name], MD Electrophysiologist: Lanier Prude, MD      History of Present Illness:   Anthony Alvarez is a 68 y.o. male with h/o chronic systolic CHF, LBBB s/p  CAD, HTN, and  seen today for overdue routine electrophysiology followup with acute component due to recent SVT.   Since last being seen in our clinic the patient reports doing OK from a cardiac perspective. He is grieving the recent loss of his son.  He does not particularly remember either of the rapid rate episodes noted on his device. He does have some DOE, but reports being mostly sedentary for the past year. Does ADLs OK. No syncope or edema.   Review of systems complete and found to be negative unless listed in HPI.   EP Information / Studies Reviewed:    EKG is ordered today. Personal review as below.  EKG Interpretation Date/Time:  Friday March 20 2024 09:40:14 EDT Ventricular Rate:  75 PR Interval:  162 QRS Duration:  148 QT Interval:  414 QTC Calculation: 462 R Axis:   -22  Text Interpretation: Atrial-sensed ventricular-paced rhythm When compared with ECG of 18-Jan-2023 09:40, Vent. rate has increased BY   8 BPM Confirmed by Maxine Glenn [phone]) on 03/20/2024 9:46:15 AM    ICD Interrogation-  reviewed in detail today,  See PACEART report.  Arrhythmia/Device History Abbott BIV ICD 03/2022 for CHF   Physical Exam:   VS:  BP 114/60 (BP Location: Left Arm, Patient Position: Sitting, Cuff Size: Normal)   Pulse 72   Resp 16   Ht 5\' 10"  (1.778 m)   Wt 210 lb (95.3 kg)   SpO2 97%   BMI 30.13 kg/m    Wt Readings from Last 3 Encounters:  03/20/24 210 lb (95.3 kg)  01/18/23 197 lb (89.4 kg)  12/14/22 187 lb (84.8 kg)     GEN: No acute distress  NECK: No JVD; No carotid bruits CARDIAC: Regular rate and rhythm, no murmurs, rubs, gallops RESPIRATORY:  Clear to auscultation without rales,  wheezing or rhonchi  ABDOMEN: Soft, non-tender, non-distended EXTREMITIES:  No edema; No deformity   ASSESSMENT AND PLAN:    Chronic systolic CHF  s/p Abbott CRT-D  EF 30-35% 02/2023 euvolemic today Stable on an appropriate medical regimen Normal ICD function See Pace Art report No changes today  ?SVT Episode on 4/2 and 4/3 caught on his device. Appears triggered by a PVC , though final beat is atrial Looks like SVT vs AVNRT Taking all medications as directed. Has had stress and had been off his torsemide for a time, but now back on.  Consider up-titration of coreg vs transition to Toprol for more cardioselectivity if he has further.   CAD Denies s/s ischemia  HTN Stable on current regimen   Cardiac Clearance for Prostate Biopsy Ok to proceed with moderate risk in setting of systolic CHF and CAD.    Disposition:   Follow up with Dr. Lalla Brothers in 12 months   Signed, Graciella Freer, PA-C

## 2024-03-20 NOTE — Patient Instructions (Signed)
 Medication Instructions:  Your physician recommends that you continue on your current medications as directed. Please refer to the Current Medication list given to you today.  *If you need a refill on your cardiac medications before your next appointment, please call your pharmacy*  Lab Work: None ordered If you have labs (blood work) drawn today and your tests are completely normal, you will receive your results only by: MyChart Message (if you have MyChart) OR A paper copy in the mail If you have any lab test that is abnormal or we need to change your treatment, we will call you to review the results.  Follow-Up: At [facility], you and your health needs are our priority.  As part of our continuing mission to provide you with exceptional heart care, our providers are all part of one team.  This team includes your primary Cardiologist (physician) and Advanced Practice Providers or APPs (Physician Assistants and Nurse Practitioners) who all work together to provide you with the care you need, when you need it.  Your next appointment:   1 year(s)  Provider:   You may see Lanier Prude, MD or one of the following Advanced Practice Providers on your designated Care Team:   Francis Dowse, [place] Casimiro Needle "Mardelle Matte" Tillery, PA-C Canary Brim, NP     1st Floor: - Lobby - Registration  - Pharmacy  - Lab - Cafe  2nd Floor: - PV Lab - Diagnostic Testing (echo, CT, nuclear med)  3rd Floor: - Vacant  4th Floor: - TCTS (cardiothoracic surgery) - AFib Clinic - Structural Heart Clinic - Vascular Surgery  - Vascular Ultrasound  5th Floor: - HeartCare Cardiology (general and EP) - Clinical Pharmacy for coumadin, hypertension, lipid, weight-loss medications, and med management appointments    Valet parking services will be available as well.

## 2024-03-21 ENCOUNTER — Encounter: Payer: Self-pay | Admitting: Cardiology

## 2024-03-23 DIAGNOSIS — M9903 Segmental and somatic dysfunction of lumbar region: Secondary | ICD-10-CM | POA: Diagnosis not present

## 2024-03-23 DIAGNOSIS — M5137 Other intervertebral disc degeneration, lumbosacral region with discogenic back pain only: Secondary | ICD-10-CM | POA: Diagnosis not present

## 2024-03-23 DIAGNOSIS — M5136 Other intervertebral disc degeneration, lumbar region with discogenic back pain only: Secondary | ICD-10-CM | POA: Diagnosis not present

## 2024-03-24 DIAGNOSIS — M5136 Other intervertebral disc degeneration, lumbar region with discogenic back pain only: Secondary | ICD-10-CM | POA: Diagnosis not present

## 2024-03-24 DIAGNOSIS — M5137 Other intervertebral disc degeneration, lumbosacral region with discogenic back pain only: Secondary | ICD-10-CM | POA: Diagnosis not present

## 2024-03-24 DIAGNOSIS — M9903 Segmental and somatic dysfunction of lumbar region: Secondary | ICD-10-CM | POA: Diagnosis not present

## 2024-03-26 DIAGNOSIS — M9903 Segmental and somatic dysfunction of lumbar region: Secondary | ICD-10-CM | POA: Diagnosis not present

## 2024-03-26 DIAGNOSIS — M5136 Other intervertebral disc degeneration, lumbar region with discogenic back pain only: Secondary | ICD-10-CM | POA: Diagnosis not present

## 2024-03-26 DIAGNOSIS — M5137 Other intervertebral disc degeneration, lumbosacral region with discogenic back pain only: Secondary | ICD-10-CM | POA: Diagnosis not present

## 2024-03-30 DIAGNOSIS — M5137 Other intervertebral disc degeneration, lumbosacral region with discogenic back pain only: Secondary | ICD-10-CM | POA: Diagnosis not present

## 2024-03-30 DIAGNOSIS — M9903 Segmental and somatic dysfunction of lumbar region: Secondary | ICD-10-CM | POA: Diagnosis not present

## 2024-03-30 DIAGNOSIS — M5136 Other intervertebral disc degeneration, lumbar region with discogenic back pain only: Secondary | ICD-10-CM | POA: Diagnosis not present

## 2024-03-31 DIAGNOSIS — M5137 Other intervertebral disc degeneration, lumbosacral region with discogenic back pain only: Secondary | ICD-10-CM | POA: Diagnosis not present

## 2024-03-31 DIAGNOSIS — M9903 Segmental and somatic dysfunction of lumbar region: Secondary | ICD-10-CM | POA: Diagnosis not present

## 2024-03-31 DIAGNOSIS — M5136 Other intervertebral disc degeneration, lumbar region with discogenic back pain only: Secondary | ICD-10-CM | POA: Diagnosis not present

## 2024-04-02 DIAGNOSIS — M5136 Other intervertebral disc degeneration, lumbar region with discogenic back pain only: Secondary | ICD-10-CM | POA: Diagnosis not present

## 2024-04-02 DIAGNOSIS — M9903 Segmental and somatic dysfunction of lumbar region: Secondary | ICD-10-CM | POA: Diagnosis not present

## 2024-04-02 DIAGNOSIS — M5137 Other intervertebral disc degeneration, lumbosacral region with discogenic back pain only: Secondary | ICD-10-CM | POA: Diagnosis not present

## 2024-04-06 ENCOUNTER — Ambulatory Visit (INDEPENDENT_AMBULATORY_CARE_PROVIDER_SITE_OTHER): Payer: Medicare Other

## 2024-04-06 DIAGNOSIS — I255 Ischemic cardiomyopathy: Secondary | ICD-10-CM | POA: Diagnosis not present

## 2024-04-06 LAB — CUP PACEART REMOTE DEVICE CHECK
Battery Remaining Longevity: 62 mo
Battery Remaining Percentage: 71 %
Battery Voltage: 2.96 V
Brady Statistic AP VP Percent: 1.2 %
Brady Statistic AP VS Percent: 1 %
Brady Statistic AS VP Percent: 94 %
Brady Statistic AS VS Percent: 4.4 %
Brady Statistic RA Percent Paced: 1.1 %
Date Time Interrogation Session: 20250421030445
HighPow Impedance: 68 Ohm
Implantable Lead Connection Status: 753985
Implantable Lead Connection Status: 753985
Implantable Lead Connection Status: 753985
Implantable Lead Implant Date: 20230424
Implantable Lead Implant Date: 20230424
Implantable Lead Implant Date: 20230424
Implantable Lead Location: 753858
Implantable Lead Location: 753859
Implantable Lead Location: 753860
Implantable Pulse Generator Implant Date: 20230424
Lead Channel Impedance Value: 400 Ohm
Lead Channel Impedance Value: 460 Ohm
Lead Channel Impedance Value: 480 Ohm
Lead Channel Pacing Threshold Amplitude: 0.5 V
Lead Channel Pacing Threshold Amplitude: 0.75 V
Lead Channel Pacing Threshold Amplitude: 0.75 V
Lead Channel Pacing Threshold Pulse Width: 0.5 ms
Lead Channel Pacing Threshold Pulse Width: 0.5 ms
Lead Channel Pacing Threshold Pulse Width: 0.8 ms
Lead Channel Sensing Intrinsic Amplitude: 11.8 mV
Lead Channel Sensing Intrinsic Amplitude: 5 mV
Lead Channel Setting Pacing Amplitude: 1.5 V
Lead Channel Setting Pacing Amplitude: 2 V
Lead Channel Setting Pacing Amplitude: 2.5 V
Lead Channel Setting Pacing Pulse Width: 0.5 ms
Lead Channel Setting Pacing Pulse Width: 0.8 ms
Lead Channel Setting Sensing Sensitivity: 0.5 mV
Pulse Gen Serial Number: 210001616
Zone Setting Status: 755011

## 2024-04-07 ENCOUNTER — Encounter: Payer: Self-pay | Admitting: Cardiology

## 2024-04-07 DIAGNOSIS — Z888 Allergy status to other drugs, medicaments and biological substances status: Secondary | ICD-10-CM | POA: Diagnosis not present

## 2024-04-07 DIAGNOSIS — I25810 Atherosclerosis of coronary artery bypass graft(s) without angina pectoris: Secondary | ICD-10-CM | POA: Diagnosis not present

## 2024-04-07 DIAGNOSIS — E119 Type 2 diabetes mellitus without complications: Secondary | ICD-10-CM | POA: Diagnosis not present

## 2024-04-07 DIAGNOSIS — I110 Hypertensive heart disease with heart failure: Secondary | ICD-10-CM | POA: Diagnosis not present

## 2024-04-07 DIAGNOSIS — I509 Heart failure, unspecified: Secondary | ICD-10-CM | POA: Diagnosis not present

## 2024-04-07 DIAGNOSIS — Z79899 Other long term (current) drug therapy: Secondary | ICD-10-CM | POA: Diagnosis not present

## 2024-04-07 DIAGNOSIS — I252 Old myocardial infarction: Secondary | ICD-10-CM | POA: Diagnosis not present

## 2024-05-27 NOTE — Progress Notes (Signed)
 Remote ICD transmission.

## 2024-07-03 ENCOUNTER — Telehealth (HOSPITAL_COMMUNITY): Payer: Self-pay

## 2024-07-03 DIAGNOSIS — I5022 Chronic systolic (congestive) heart failure: Secondary | ICD-10-CM

## 2024-07-03 MED ORDER — SACUBITRIL-VALSARTAN 24-26 MG PO TABS: 1.0000 | ORAL_TABLET | Freq: Two times a day (BID) | ORAL | 0 refills | Status: DC

## 2024-07-03 NOTE — Telephone Encounter (Signed)
 Patient's Entresto  medication has bee sent to pt's pharmacy.

## 2024-07-06 ENCOUNTER — Ambulatory Visit: Payer: Medicare Other

## 2024-07-06 DIAGNOSIS — I255 Ischemic cardiomyopathy: Secondary | ICD-10-CM | POA: Diagnosis not present

## 2024-07-08 LAB — CUP PACEART REMOTE DEVICE CHECK
Battery Remaining Longevity: 58 mo
Battery Remaining Percentage: 67 %
Battery Voltage: 2.96 V
Brady Statistic AP VP Percent: 3.8 %
Brady Statistic AP VS Percent: 1 %
Brady Statistic AS VP Percent: 93 %
Brady Statistic AS VS Percent: 3.4 %
Brady Statistic RA Percent Paced: 3.7 %
Date Time Interrogation Session: 20250723121115
HighPow Impedance: 68 Ohm
Implantable Lead Connection Status: 753985
Implantable Lead Connection Status: 753985
Implantable Lead Connection Status: 753985
Implantable Lead Implant Date: 20230424
Implantable Lead Implant Date: 20230424
Implantable Lead Implant Date: 20230424
Implantable Lead Location: 753858
Implantable Lead Location: 753859
Implantable Lead Location: 753860
Implantable Pulse Generator Implant Date: 20230424
Lead Channel Impedance Value: 400 Ohm
Lead Channel Impedance Value: 450 Ohm
Lead Channel Impedance Value: 480 Ohm
Lead Channel Pacing Threshold Amplitude: 0.5 V
Lead Channel Pacing Threshold Amplitude: 0.75 V
Lead Channel Pacing Threshold Amplitude: 0.75 V
Lead Channel Pacing Threshold Pulse Width: 0.5 ms
Lead Channel Pacing Threshold Pulse Width: 0.5 ms
Lead Channel Pacing Threshold Pulse Width: 0.8 ms
Lead Channel Sensing Intrinsic Amplitude: 11.8 mV
Lead Channel Sensing Intrinsic Amplitude: 5 mV
Lead Channel Setting Pacing Amplitude: 1.5 V
Lead Channel Setting Pacing Amplitude: 2 V
Lead Channel Setting Pacing Amplitude: 2.5 V
Lead Channel Setting Pacing Pulse Width: 0.5 ms
Lead Channel Setting Pacing Pulse Width: 0.8 ms
Lead Channel Setting Sensing Sensitivity: 0.5 mV
Pulse Gen Serial Number: 210001616
Zone Setting Status: 755011

## 2024-07-09 ENCOUNTER — Ambulatory Visit: Payer: Self-pay | Admitting: Cardiology

## 2024-07-23 DIAGNOSIS — Z86018 Personal history of other benign neoplasm: Secondary | ICD-10-CM | POA: Diagnosis not present

## 2024-07-23 DIAGNOSIS — K219 Gastro-esophageal reflux disease without esophagitis: Secondary | ICD-10-CM | POA: Diagnosis not present

## 2024-07-23 DIAGNOSIS — I2510 Atherosclerotic heart disease of native coronary artery without angina pectoris: Secondary | ICD-10-CM | POA: Diagnosis not present

## 2024-07-23 DIAGNOSIS — Z95810 Presence of automatic (implantable) cardiac defibrillator: Secondary | ICD-10-CM | POA: Diagnosis not present

## 2024-07-30 ENCOUNTER — Other Ambulatory Visit (HOSPITAL_COMMUNITY): Payer: Self-pay | Admitting: Internal Medicine

## 2024-07-30 DIAGNOSIS — I5022 Chronic systolic (congestive) heart failure: Secondary | ICD-10-CM

## 2024-08-29 ENCOUNTER — Other Ambulatory Visit (HOSPITAL_COMMUNITY): Payer: Self-pay | Admitting: Internal Medicine

## 2024-08-29 DIAGNOSIS — I5022 Chronic systolic (congestive) heart failure: Secondary | ICD-10-CM

## 2024-09-04 DIAGNOSIS — E119 Type 2 diabetes mellitus without complications: Secondary | ICD-10-CM | POA: Diagnosis not present

## 2024-09-05 LAB — LAB REPORT - SCANNED: A1c: 6.1

## 2024-09-07 ENCOUNTER — Encounter: Payer: Self-pay | Admitting: Family Medicine

## 2024-09-21 ENCOUNTER — Ambulatory Visit (HOSPITAL_COMMUNITY): Admitting: Internal Medicine

## 2024-09-21 NOTE — Progress Notes (Signed)
 Remote ICD Transmission

## 2024-09-22 ENCOUNTER — Telehealth (HOSPITAL_COMMUNITY): Payer: Self-pay | Admitting: Cardiology

## 2024-09-22 NOTE — Telephone Encounter (Signed)
 Called to confirm/remind patient of their appointment at the [facility] on 09/22/24 .   Appointment:   [x] Confirmed  [] Left mess   [] No answer/No voice mail  [] VM Full/unable to leave message  [] Phone not in service  Patient reminded to bring all medications and/or complete list.  Confirmed patient has transportation. Gave directions, instructed to utilize valet parking.

## 2024-09-23 ENCOUNTER — Ambulatory Visit (HOSPITAL_COMMUNITY)
Admission: RE | Admit: 2024-09-23 | Discharge: 2024-09-23 | Disposition: A | Discharge status: Home / Self Care | Source: Ambulatory Visit | Attending: Internal Medicine | Admitting: Internal Medicine

## 2024-09-23 VITALS — BP 140/80 | HR 76 | Wt 222.2 lb

## 2024-09-23 DIAGNOSIS — Z8679 Personal history of other diseases of the circulatory system: Secondary | ICD-10-CM | POA: Insufficient documentation

## 2024-09-23 DIAGNOSIS — Z7982 Long term (current) use of aspirin: Secondary | ICD-10-CM | POA: Diagnosis not present

## 2024-09-23 DIAGNOSIS — I255 Ischemic cardiomyopathy: Secondary | ICD-10-CM | POA: Diagnosis not present

## 2024-09-23 DIAGNOSIS — I252 Old myocardial infarction: Secondary | ICD-10-CM | POA: Diagnosis not present

## 2024-09-23 DIAGNOSIS — E1169 Type 2 diabetes mellitus with other specified complication: Secondary | ICD-10-CM | POA: Diagnosis not present

## 2024-09-23 DIAGNOSIS — I447 Left bundle-branch block, unspecified: Secondary | ICD-10-CM

## 2024-09-23 DIAGNOSIS — E119 Type 2 diabetes mellitus without complications: Secondary | ICD-10-CM | POA: Insufficient documentation

## 2024-09-23 DIAGNOSIS — I2510 Atherosclerotic heart disease of native coronary artery without angina pectoris: Secondary | ICD-10-CM | POA: Diagnosis not present

## 2024-09-23 DIAGNOSIS — F909 Attention-deficit hyperactivity disorder, unspecified type: Secondary | ICD-10-CM | POA: Insufficient documentation

## 2024-09-23 DIAGNOSIS — Z79899 Other long term (current) drug therapy: Secondary | ICD-10-CM | POA: Insufficient documentation

## 2024-09-23 DIAGNOSIS — I5022 Chronic systolic (congestive) heart failure: Secondary | ICD-10-CM

## 2024-09-23 DIAGNOSIS — Z87891 Personal history of nicotine dependence: Secondary | ICD-10-CM | POA: Insufficient documentation

## 2024-09-23 DIAGNOSIS — Z95810 Presence of automatic (implantable) cardiac defibrillator: Secondary | ICD-10-CM | POA: Diagnosis not present

## 2024-09-23 DIAGNOSIS — Z951 Presence of aortocoronary bypass graft: Secondary | ICD-10-CM | POA: Diagnosis not present

## 2024-09-23 NOTE — Patient Instructions (Signed)
 Medication Changes:  None, continue current medications   Special Instructions // Education:  Do the following things EVERYDAY: Weigh yourself in the morning before breakfast. Write it down and keep it in a log. Take your medicines as prescribed Eat low salt foods--Limit salt (sodium) to 2000 mg per day.  Stay as active as you can everyday Limit all fluids for the day to less than 2 liters   Follow-Up in: 1 year with an echocardiogram (October 2026), **PLEASE CALL OUR OFFICE IN AUGUST TO SCHEDULE THIS APPOINTMENT   At the [facility], you and your health needs are our priority. We have a designated team specialized in the treatment of Heart Failure. This Care Team includes your primary Heart Failure Specialized Cardiologist (physician), Advanced Practice Providers (APPs- Physician Assistants and Nurse Practitioners), and Pharmacist who all work together to provide you with the care you need, when you need it.   You may see any of the following providers on your designated Care Team at your next follow up:  Dr. Toribio Fuel Dr. Ezra Shuck Dr. Ria Commander Dr. Odis Brownie Greig Mosses, NP Caffie Shed, [place] [facility], [place] Beckey Coe, NP [place] Lee, NP Tinnie Redman, PharmD   Please be sure to bring in all your medications bottles to every appointment.   Need to Contact Us :  If you have any questions or concerns before your next appointment please send us  a message through [place] or call our office at [phone].    TO LEAVE A MESSAGE FOR THE NURSE SELECT OPTION 2, PLEASE LEAVE A MESSAGE INCLUDING: YOUR NAME DATE OF BIRTH CALL BACK NUMBER REASON FOR CALL**this is important as we prioritize the call backs  YOU WILL RECEIVE A CALL BACK THE SAME DAY AS LONG AS YOU CALL BEFORE 4:00 PM

## 2024-09-23 NOTE — Addendum Note (Signed)
 Encounter addended by: Buell Powell HERO, RN on: 09/23/2024 3:06 PM  Actions taken: Visit diagnoses modified, Order list changed, Diagnosis association updated, Clinical Note Signed

## 2024-09-23 NOTE — Progress Notes (Signed)
 ADVANCED HF CLINIC NOTE  Primary Care: Okey Carlin Redbird, MD Primary Cardiologist: Dr. Anner HF Cardiologist: Dr. Cherrie  HPI:  Mr Nordahl is a 68 y.o. with CAD s/p CABG 2020, DM2, HL, ADHD previously on adderall,  LBBB and systolic HF due to iCM.     Admitted 8/20 with NSTEMI. LHC with severe 2v CAD with totalled LAD. LVEF at that time was 35 to 45%.  He underwent CABG with LIMA to LAD, SVG to PDA, SVG to PL 08/13/2019 with Dr. Shyrl.    In 2021 EF was up to 45-50%.    Admitted with 12/22 with ADHF. Cath with severe 2V CAD with CTO of proximal LAD and mild RCA. LIMA patent.  Chronically occluded SVG-rPDA and SVG-rPL. Echo EF 20-25%    Underwent CRT-D implant 4/23  Echo 8/23 EF 30-35%  Had a fall and hit head, seen in ED 12/09/22 and CT head showed subdural bleed. Neurosurgery felt it was stable and arranged follow up in 2 weeks. He returned to ED 12/14/22 with HA. Treated with amitriptyline  and Vicodin, neuro follow up arranged.  Today he returns for HF follow up. Says he is still grieving over lost of his 28 y/o son a year and a half ago due to cardiac arrest. Does all activities without significant problems. Mild DOE. No CP. Dizziness much improved. BP at 120-130/80-90s    Cardiac Testing  - Echo 08/14/22: EF 30-35%  - Echo 12/22: EF 20-25% RV moderately reduced   - RHC/LHC 11/20/21 Severe two-vessel coronary artery disease with chronic total occlusions of proximal LAD and mid RCA.  There is also moderate diffuse disease of the distal LCx. Widely patent LIMA-D3, which backfills the mid and distal LAD. Chronically occluded SVG-rPDA and SVG-rPL. Mildly-moderately elevated left heart filling pressure. Normal right heart filling pressure. Mildly reduced Fick cardiac output.   - Echo 1/22: EF 45-50%   Past Medical History:  Diagnosis Date   CHF (congestive heart failure) (HCC)    Chronic HFrEF (heart failure with reduced ejection fraction) (HCC) 11/17/2021   EF down  to 20-25% with Acute Presentation of HFrEF - Global & LAD territory HK.   Coronary artery disease involving coronary bypass graft of native heart 08/12/2019   SVG-rPDA & SVG-RPL 100% occluded.   HLD (hyperlipidemia)    Hyperlipidemia associated with type 2 diabetes mellitus (HCC) 11/30/2019   Ischemic cardiomyopathy 11/30/2019   Post NSTEMI-CABG, EF increased from 35% to 45% => 11/2021 Acute on Chronic HFrEF -> EF downt to 20-25%. Cath with both SVGs to RPDA & PL along with native RCA CTO.  Patent LIMA-D3(LAD).  L-RPDA collaterals.  = titration of meds limited by Hypotension.   Multivessel CAD - 100% LAD (with Diag), 99% mRCA (subtotal occlusio) 08/12/2019   NSTEMI (non-ST elevated myocardial infarction) (HCC) 08/10/2019   S/P Off Pump CABG x 3 08/13/2019   LIMA to LAD RSVG to PDA RSVG to PLVB   Current Outpatient Medications  Medication Sig Dispense Refill   acetaminophen  (TYLENOL ) 500 MG tablet Take 500-1,000 mg by mouth every 8 (eight) hours as needed for mild pain.     ALPRAZolam (XANAX) 0.25 MG tablet Take 0.25 mg by mouth daily as needed for anxiety.     amphetamine-dextroamphetamine (ADDERALL) 20 MG tablet Take 10 mg by mouth 4 (four) times daily as needed (focus).     aspirin  EC 81 MG tablet Take 81 mg by mouth daily.     b complex vitamins capsule Take 1 capsule by  mouth daily.     carvedilol  (COREG ) 6.25 MG tablet Take 1 tablet (6.25 mg total) by mouth 2 (two) times daily with a meal. NEEDS FOLLOW UP APPOINTMENT FOR MORE REFILLS 180 tablet 0   Cholecalciferol (VITAMIN D3) 50 MCG (2000 UT) capsule Take 2,000 Units by mouth daily.     ENTRESTO  24-26 MG TAKE 1 TABLET BY MOUTH TWICE A DAY 60 tablet 0   glucose blood test strip TEST FOUR TIMES DAILY OR AS DIRECTED 100 each 1   Lancets (ONETOUCH ULTRASOFT) lancets Use as instructed 100 each 1   MAGNESIUM  PO Take 250 mg by mouth daily.     Melatonin 10 MG CAPS Take 10 mg by mouth at bedtime as needed (sleep).     Menaquinone-7 (K2 PO)  Take 100 mg by mouth daily.     Multiple Vitamin (MULTI-VITAMIN) tablet Take 1 tablet by mouth daily.     Potassium 99 MG TABS Patient takes 1/2 tablet by mouth once a day.     torsemide  (DEMADEX ) 20 MG tablet Take 0.5 tablets (10 mg total) by mouth daily. TAKE 1 OR 2 TABLETS BY MOUTH ONCE DAILYTAKE 1 OR 2 TABLETS BY MOUTH ONCE DAILY 45 tablet 3   No current facility-administered medications for this encounter.   Allergies  Allergen Reactions   Atorvastatin  Other (See Comments)    Memory deficit, fatigue, myalgias, insomnia; similar symptoms with rosuvastatin .   Icosapent  Ethyl (Epa Ethyl Ester) (Fish)     lethargy   Rosuvastatin  Other (See Comments)    Lethargic, fatigue   Social History   Socioeconomic History   Marital status: Married    Spouse name: Not on file   Number of children: Not on file   Years of education: Not on file   Highest education level: Not on file  Occupational History   Not on file  Tobacco Use   Smoking status: Former    Types: Cigars   Smokeless tobacco: Never  Vaping Use   Vaping status: Never Used  Substance and Sexual Activity   Alcohol use: Not Currently    Comment: not in the past 6 months   Drug use: Not Currently   Sexual activity: Not on file  Other Topics Concern   Not on file  Social History Narrative   Not on file   Social Drivers of Health   Financial Resource Strain: Low Risk  (11/27/2021)   Overall Financial Resource Strain (CARDIA)    Difficulty of Paying Living Expenses: Not very hard  Food Insecurity: Low Risk  (02/06/2024)   Received from [facility]   Hunger Vital Sign    Within the past 12 months, you worried that your food would run out before you got money to buy more: Never true    Within the past 12 months, the food you bought just didn't last and you didn't have money to get more. : Never true  Transportation Needs: No Transportation Needs (02/06/2024)   Received from [facility]    In the  past 12 months, has lack of reliable transportation kept you from medical appointments, meetings, work or from getting things needed for daily living? : No  Physical Activity: Not on file  Stress: Not on file  Social Connections: Not on file  Intimate Partner Violence: Not on file    No family history on file.  BP (!) 140/80   Pulse 76   Wt 100.8 kg (222 lb 3.2 oz)   SpO2 96%  BMI 31.88 kg/m   Wt Readings from Last 3 Encounters:  09/23/24 100.8 kg (222 lb 3.2 oz)  03/20/24 95.3 kg (210 lb)  01/18/23 89.4 kg (197 lb)   PHYSICAL EXAM: General:  Well appearing. No resp difficulty HEENT: normal Neck: supple. no JVD. Carotids 2+ bilat; no bruits. No lymphadenopathy or thryomegaly appreciated. Cor: PMI nondisplaced. Regular rate & rhythm. No rubs, gallops or murmurs. Lungs: clear Abdomen: soft, nontender, nondistended. No hepatosplenomegaly. No bruits or masses. Good bowel sounds. Extremities: no cyanosis, clubbing, rash, edema Neuro: alert & orientedx3, cranial nerves grossly intact. moves all 4 extremities w/o difficulty. Affect pleasant  ICD interrogation: No VT/AF. BiVP 97% volume ok Personally reviewed   ASSESSMENT & PLAN:  1. Chronic HFrEF due to iCM +/- LBBB - Echo 2021 EF 45-50%.  - Echo 12/22 EF 20-25% - cMRI 3/23 EF 19% extensive scar  - Echo 08/14/22 EF 30-35% - Echo 3/24 EF 30-35% RV ok  - Wide LBBB on ECG s/p STJ CRT-D 4/23 with Dr. Cindie - NYHA II. Volume ok - Continue torsemide  to 10 mg as needed - Continue carvedilol  6.25 mg bid  - Continue Entresto  24-26 mg bid. We discussed increasing dose but he would like yo see what happens with weight loss first - Continue Farxiga  10 mg daily. - Refuses spiro or eplerenone  d/t side effects  2. LBBB - Suspect LBBB CM. - s/p STJ CRT-D 4/23 with Dr. Cindie - device interrogation as above   3. CAD - S/P CABG 2021 - LHC 11/2021 severe 2V CAD with CTO of proximal LAD and mild RCA . LIMA patent. + chronically  occluded SVG-rPDA and SVG-rPL. - No s/s angina - Intolerant statin. On Repatha .  - Continue ASA + beta blocker.    4. DMII - A1c much improved - Continue Farxiga . - Consider GLP1RA per PCP    5. ADHD - OK to continue Adderall, takes 4x/day, not tachycardic on exam  6. Subdural hematoma - No intervention required - Had follow up with Neurosurgery, Dr. Debby. Did not feel dizziness r/t SDH  Toribio Fuel, MD  2:53 PM

## 2024-09-30 ENCOUNTER — Other Ambulatory Visit (HOSPITAL_COMMUNITY): Payer: Self-pay | Admitting: Internal Medicine

## 2024-09-30 DIAGNOSIS — I5022 Chronic systolic (congestive) heart failure: Secondary | ICD-10-CM

## 2024-10-05 ENCOUNTER — Ambulatory Visit (INDEPENDENT_AMBULATORY_CARE_PROVIDER_SITE_OTHER): Payer: Medicare Other

## 2024-10-05 DIAGNOSIS — I5022 Chronic systolic (congestive) heart failure: Secondary | ICD-10-CM | POA: Diagnosis not present

## 2024-10-06 LAB — CUP PACEART REMOTE DEVICE CHECK
Battery Remaining Longevity: 57 mo
Battery Remaining Percentage: 65 %
Battery Voltage: 2.96 V
Brady Statistic AP VP Percent: 10 %
Brady Statistic AP VS Percent: 1 %
Brady Statistic AS VP Percent: 87 %
Brady Statistic AS VS Percent: 2.5 %
Brady Statistic RA Percent Paced: 10 %
Date Time Interrogation Session: 20251020020923
HighPow Impedance: 72 Ohm
Implantable Lead Connection Status: 753985
Implantable Lead Connection Status: 753985
Implantable Lead Connection Status: 753985
Implantable Lead Implant Date: 20230424
Implantable Lead Implant Date: 20230424
Implantable Lead Implant Date: 20230424
Implantable Lead Location: 753858
Implantable Lead Location: 753859
Implantable Lead Location: 753860
Implantable Pulse Generator Implant Date: 20230424
Lead Channel Impedance Value: 410 Ohm
Lead Channel Impedance Value: 500 Ohm
Lead Channel Impedance Value: 510 Ohm
Lead Channel Pacing Threshold Amplitude: 0.5 V
Lead Channel Pacing Threshold Amplitude: 0.75 V
Lead Channel Pacing Threshold Amplitude: 0.75 V
Lead Channel Pacing Threshold Pulse Width: 0.5 ms
Lead Channel Pacing Threshold Pulse Width: 0.5 ms
Lead Channel Pacing Threshold Pulse Width: 0.8 ms
Lead Channel Sensing Intrinsic Amplitude: 11.3 mV
Lead Channel Sensing Intrinsic Amplitude: 5 mV
Lead Channel Setting Pacing Amplitude: 1.5 V
Lead Channel Setting Pacing Amplitude: 2 V
Lead Channel Setting Pacing Amplitude: 2.5 V
Lead Channel Setting Pacing Pulse Width: 0.5 ms
Lead Channel Setting Pacing Pulse Width: 0.8 ms
Lead Channel Setting Sensing Sensitivity: 0.5 mV
Pulse Gen Serial Number: 210001616
Zone Setting Status: 755011

## 2024-10-09 ENCOUNTER — Other Ambulatory Visit: Payer: Self-pay | Admitting: Gastroenterology

## 2024-10-09 ENCOUNTER — Telehealth (HOSPITAL_BASED_OUTPATIENT_CLINIC_OR_DEPARTMENT_OTHER): Payer: Self-pay

## 2024-10-09 NOTE — Telephone Encounter (Signed)
 I s/w the pt about needing an appt in office for preop. Pt said no he just saw Dr. Bensimhon a few days ago. I reviewed the chart and the pt was seen by Dr. Cherrie 09/23/24. Pt said he has no desire to see Dr. Anner. I stated the appt would be with one of his APP's. I stated to the pt that ii will run this past the preop team as he was just seen for further recommendations.

## 2024-10-09 NOTE — Telephone Encounter (Signed)
 S/w the pt and informed him that preop APP Lamarr L. DNP cleared him and he can hold ASA 5-7 days prior to procedure, resume ASA once surgeon feels it is safe. Pt thanked our team for all of our help.

## 2024-10-09 NOTE — Progress Notes (Signed)
 Remote ICD Transmission

## 2024-10-09 NOTE — Telephone Encounter (Signed)
 She may see an APP if a slot is available. Also may need to discuss change in cardiologist if warranted.

## 2024-10-09 NOTE — Telephone Encounter (Signed)
   Name: [name]  DOB: 1956-08-22  MRN: [id number]  Primary Cardiologist: Alm Clay, MD  Chart reviewed as part of pre-operative protocol coverage. Because of Anthony Alvarez's past medical history and time since last visit, he will require a follow-up in-office visit in order to better assess preoperative cardiovascular risk. Last seen by EP who does not give clearances.   Pre-op covering staff: - Please schedule appointment and call patient to inform them. If patient already had an upcoming appointment within acceptable timeframe, please add pre-op clearance to the appointment notes so provider is aware. - Please contact requesting surgeon's office via preferred method (i.e, phone, fax) to inform them of need for appointment prior to surgery. [name], NP  10/09/2024, 1:07 PM

## 2024-10-09 NOTE — Telephone Encounter (Signed)
   Patient Name: Anthony Alvarez  DOB: 13-Oct-1956 MRN: [id number]  Primary Cardiologist: Alm Clay, MD  Chart reviewed as part of pre-operative protocol coverage. Given past medical history and time since last visit, based on ACC/AHA guidelines, [name] is at acceptable risk for the planned procedure without further cardiovascular testing.   Per office protocol, if patient is without any new symptoms or concerns at the time of their virtual visit, he may hold ASA for 5-7 days prior to procedure. Please resume aspirin  as soon as possible postprocedure, at the discretion of the surgeon.    The patient was advised that if he develops new symptoms prior to surgery to contact our office to arrange for a follow-up visit, and he verbalized understanding.  I will route this recommendation to the requesting party via Epic fax function and remove from pre-op pool.  Please call with questions.  Lamarr Satterfield, NP 10/09/2024, 2:20 PM

## 2024-10-09 NOTE — Telephone Encounter (Signed)
   Pre-operative Risk Assessment    Patient Name: Anthony Alvarez  DOB: [birth year] MRN: [id number]   Date of last office visit: 03/20/24 with Jodie Passey PA-C Date of next office visit: NA   Request for Surgical Clearance    Procedure:  Colonoscopy   Date of Surgery:  Clearance 10/27/24                                 Surgeon:  Dr. Dianna Surgeon's Group or Practice Name:  [facility] Phone number:  [phone] Fax number:  [phone]   Type of Clearance Requested:   - Medical  - Pharmacy:  Hold Aspirin  not indicated   Type of Anesthesia:  propofol    Additional requests/questions:    Bonney Augustin JONETTA Delores   10/09/2024, 11:57 AM

## 2024-10-12 ENCOUNTER — Ambulatory Visit: Payer: Self-pay | Admitting: Cardiology

## 2024-10-20 ENCOUNTER — Encounter (HOSPITAL_COMMUNITY): Payer: Self-pay | Admitting: Gastroenterology

## 2024-10-26 ENCOUNTER — Encounter (HOSPITAL_COMMUNITY): Payer: Self-pay | Admitting: Gastroenterology

## 2024-10-27 ENCOUNTER — Encounter (HOSPITAL_COMMUNITY)
Admission: RE | Disposition: A | Discharge status: Home / Self Care | Payer: Self-pay | Source: Home / Self Care | Attending: Gastroenterology

## 2024-10-27 ENCOUNTER — Ambulatory Visit (HOSPITAL_COMMUNITY): Admitting: Anesthesiology

## 2024-10-27 ENCOUNTER — Other Ambulatory Visit: Payer: Self-pay

## 2024-10-27 ENCOUNTER — Ambulatory Visit (HOSPITAL_COMMUNITY)
Admission: RE | Admit: 2024-10-27 | Discharge: 2024-10-27 | Disposition: A | Discharge status: Home / Self Care | Attending: Gastroenterology | Admitting: Gastroenterology

## 2024-10-27 ENCOUNTER — Encounter (HOSPITAL_COMMUNITY): Payer: Self-pay | Admitting: Gastroenterology

## 2024-10-27 DIAGNOSIS — Z87891 Personal history of nicotine dependence: Secondary | ICD-10-CM | POA: Diagnosis not present

## 2024-10-27 DIAGNOSIS — I358 Other nonrheumatic aortic valve disorders: Secondary | ICD-10-CM | POA: Diagnosis not present

## 2024-10-27 DIAGNOSIS — I252 Old myocardial infarction: Secondary | ICD-10-CM | POA: Diagnosis not present

## 2024-10-27 DIAGNOSIS — Z860101 Personal history of adenomatous and serrated colon polyps: Secondary | ICD-10-CM

## 2024-10-27 DIAGNOSIS — D123 Benign neoplasm of transverse colon: Secondary | ICD-10-CM

## 2024-10-27 DIAGNOSIS — Z1211 Encounter for screening for malignant neoplasm of colon: Secondary | ICD-10-CM | POA: Insufficient documentation

## 2024-10-27 DIAGNOSIS — I2510 Atherosclerotic heart disease of native coronary artery without angina pectoris: Secondary | ICD-10-CM | POA: Diagnosis not present

## 2024-10-27 DIAGNOSIS — I509 Heart failure, unspecified: Secondary | ICD-10-CM | POA: Diagnosis not present

## 2024-10-27 DIAGNOSIS — K640 First degree hemorrhoids: Secondary | ICD-10-CM | POA: Insufficient documentation

## 2024-10-27 DIAGNOSIS — E119 Type 2 diabetes mellitus without complications: Secondary | ICD-10-CM | POA: Insufficient documentation

## 2024-10-27 DIAGNOSIS — I110 Hypertensive heart disease with heart failure: Secondary | ICD-10-CM | POA: Diagnosis not present

## 2024-10-27 DIAGNOSIS — I5022 Chronic systolic (congestive) heart failure: Secondary | ICD-10-CM

## 2024-10-27 DIAGNOSIS — D125 Benign neoplasm of sigmoid colon: Secondary | ICD-10-CM | POA: Diagnosis not present

## 2024-10-27 HISTORY — PX: COLONOSCOPY: SHX5424

## 2024-10-27 HISTORY — DX: Presence of automatic (implantable) cardiac defibrillator: Z95.810

## 2024-10-27 LAB — GLUCOSE, CAPILLARY: Glucose-Capillary: 92 mg/dL (ref 70–99)

## 2024-10-27 SURGERY — COLONOSCOPY
Anesthesia: Monitor Anesthesia Care

## 2024-10-27 MED ORDER — PHENYLEPHRINE HCL (PRESSORS) 10 MG/ML IV SOLN
INTRAVENOUS | Status: DC | PRN
  Administered 2024-10-27 (×2): 80 ug via INTRAVENOUS

## 2024-10-27 MED ORDER — SODIUM CHLORIDE 0.9 % IV SOLN: INTRAVENOUS | Status: DC

## 2024-10-27 MED ORDER — PROPOFOL 500 MG/50ML IV EMUL
INTRAVENOUS | Status: DC | PRN
Start: 2024-10-27 — End: 2024-10-27
  Administered 2024-10-27: 50 mg via INTRAVENOUS
  Administered 2024-10-27: 135 ug/kg/min via INTRAVENOUS

## 2024-10-27 MED ORDER — LIDOCAINE HCL (CARDIAC) PF 100 MG/5ML IV SOSY
PREFILLED_SYRINGE | INTRAVENOUS | Status: DC | PRN
  Administered 2024-10-27: 80 mg via INTRAVENOUS

## 2024-10-27 NOTE — Interval H&P Note (Signed)
 History and Physical Interval Note:  10/27/2024 9:27 AM  Anthony Alvarez  has presented today for surgery, with the diagnosis of History of colon polyps.  The various methods of treatment have been discussed with the patient and family. After consideration of risks, benefits and other options for treatment, the patient has consented to  Procedure(s): COLONOSCOPY (N/A) as a surgical intervention.  The patient's history has been reviewed, patient examined, no change in status, stable for surgery.  I have reviewed the patient's chart and labs.  Questions were answered to the patient's satisfaction.     [name]

## 2024-10-27 NOTE — Anesthesia Preprocedure Evaluation (Signed)
 Anesthesia Evaluation  Patient identified by MRN, date of birth, ID band Patient awake    Reviewed: Allergy & Precautions, NPO status , Patient's Chart, lab work & pertinent test results  History of Anesthesia Complications Negative for: history of anesthetic complications  Airway Mallampati: II  TM Distance: >3 FB Neck ROM: Full    Dental  (+) Teeth Intact   Pulmonary neg pulmonary ROS, neg sleep apnea, neg COPD, Patient abstained from smoking.Not current smoker, former smoker   Pulmonary exam normal breath sounds clear to auscultation       Cardiovascular Exercise Tolerance: Good METShypertension, + CAD, + Past MI, + CABG and +CHF  (-) dysrhythmias + Cardiac Defibrillator  Rhythm:Regular Rate:Normal - Systolic murmurs Per cardiology APP note recenty:  Anthony Alvarez is at acceptable risk for the planned procedure without further cardiovascular testing.  TTE 02/2023:  1. Global hypokinesis with akinesis of the inferolateral and basal  inferior walls; overall moderate to severe LV dysfunction.   2. Left ventricular ejection fraction, by estimation, is 30 to 35%. The  left ventricle has moderate to severely decreased function. The left  ventricle demonstrates regional wall motion abnormalities (see scoring  diagram/findings for description). The  left ventricular internal cavity size was severely dilated. Left  ventricular diastolic parameters are consistent with Grade I diastolic  dysfunction (impaired relaxation).   3. Right ventricular systolic function is normal. The right ventricular  size is normal.   4. The mitral valve is normal in structure. Trivial mitral valve  regurgitation. No evidence of mitral stenosis.   5. The aortic valve is tricuspid. Aortic valve regurgitation is not  visualized. Aortic valve sclerosis is present, with no evidence of aortic  valve stenosis.   6. Aortic dilatation noted. There is mild  dilatation of the aortic root,  measuring 43 mm. There is mild dilatation of the ascending aorta,  measuring 39 mm.   7. The inferior vena cava is normal in size with greater than 50%  respiratory variability, suggesting right atrial pressure of 3 mmHg.     Neuro/Psych negative neurological ROS  negative psych ROS   GI/Hepatic ,neg GERD  ,,(+)     (-) substance abuse    Endo/Other  diabetes, Oral Hypoglycemic Agents    Renal/GU negative Renal ROS     Musculoskeletal   Abdominal   Peds  Hematology   Anesthesia Other Findings Past Medical History: No date: AICD (automatic cardioverter/defibrillator) present No date: CHF (congestive heart failure) (HCC) 11/17/2021: Chronic HFrEF (heart failure with reduced ejection  fraction) (HCC)     Comment:  EF down to 20-25% with Acute Presentation of HFrEF -               Global & LAD territory HK. 08/12/2019: Coronary artery disease involving coronary bypass graft  of native heart     Comment:  SVG-rPDA & SVG-RPL 100% occluded. No date: HLD (hyperlipidemia) 11/30/2019: Hyperlipidemia associated with type 2 diabetes mellitus  (HCC) 11/30/2019: Ischemic cardiomyopathy     Comment:  Post NSTEMI-CABG, EF increased from 35% to 45% =>               11/2021 Acute on Chronic HFrEF -> EF downt to 20-25%.               Cath with both SVGs to RPDA & PL along with native RCA               CTO.  Patent LIMA-D3(LAD).  L-RPDA collaterals.  =  titration of meds limited by Hypotension. 08/12/2019: Multivessel CAD - 100% LAD (with Diag), 99% mRCA  (subtotal occlusio) 08/10/2019: NSTEMI (non-ST elevated myocardial infarction) (HCC) 08/13/2019: S/P Off Pump CABG x 3     Comment:  LIMA to LAD RSVG to PDA RSVG to PLVB  Reproductive/Obstetrics                              Anesthesia Physical Anesthesia Plan  ASA: 3  Anesthesia Plan: MAC   Post-op Pain Management: Minimal or no pain anticipated    Induction: Intravenous  PONV Risk Score and Plan: 1 and Propofol  infusion, TIVA and Ondansetron   Airway Management Planned: Nasal Cannula  Additional Equipment: None  Intra-op Plan:   Post-operative Plan:   Informed Consent: I have reviewed the patients History and Physical, chart, labs and discussed the procedure including the risks, benefits and alternatives for the proposed anesthesia with the patient or authorized representative who has indicated his/her understanding and acceptance.     Dental advisory given  Plan Discussed with: CRNA and Surgeon  Anesthesia Plan Comments: (Discussed risks of anesthesia with patient, including possibility of difficulty with spontaneous ventilation under anesthesia necessitating airway intervention, PONV, and rare risks such as cardiac or respiratory or neurological events, and allergic reactions. Discussed the role of CRNA in patient's perioperative care. Patient understands.)        Anesthesia Quick Evaluation

## 2024-10-27 NOTE — Anesthesia Postprocedure Evaluation (Signed)
 Anesthesia Post Note  Patient: Anthony Alvarez  Procedure(s) Performed: COLONOSCOPY     Patient location during evaluation: PACU Anesthesia Type: MAC Level of consciousness: awake and alert Pain management: pain level controlled Vital Signs Assessment: post-procedure vital signs reviewed and stable Respiratory status: spontaneous breathing, nonlabored ventilation, respiratory function stable and patient connected to nasal cannula oxygen Cardiovascular status: stable and blood pressure returned to baseline Postop Assessment: no apparent nausea or vomiting Anesthetic complications: no   No notable events documented.  Last Vitals:  Vitals:   10/27/24 1010 10/27/24 1020  BP: (!) 167/77 134/65  Pulse: (!) 59 63  Resp: 17 17  Temp:    SpO2: 100% 100%    Last Pain:  Vitals:   10/27/24 1020  TempSrc:   PainSc: 0-No pain                 Rome Ade

## 2024-10-27 NOTE — Transfer of Care (Signed)
 Immediate Anesthesia Transfer of Care Note  Patient: Anthony Alvarez  Procedure(s) Performed: Procedure(s): COLONOSCOPY (N/A)  Patient Location: PACU  Anesthesia Type:MAC  Level of Consciousness:  sedated, patient cooperative and responds to stimulation  Airway & Oxygen Therapy:Patient Spontanous Breathing and Patient connected to face mask oxgen  Post-op Assessment:  Report given to PACU RN and Post -op Vital signs reviewed and stable  Post vital signs:  Reviewed and stable  Last Vitals:  Vitals:   10/27/24 0757 10/27/24 1002  BP: 135/61   Pulse: 64 66  Resp: 19 18  Temp: (!) 36.2 C   SpO2: 96% 100%    Complications: No apparent anesthesia complications

## 2024-10-27 NOTE — Op Note (Signed)
 [facility] Patient Name: Anthony Alvarez Procedure Date: 10/27/2024 MRN: [id number] Attending MD: [name] , MD, [id number] Date of Birth: 24-Oct-1956 CSN: [id number] Age: 68 Admit Type: Outpatient Procedure:                Colonoscopy Indications:              High risk colon cancer surveillance: Personal                            history of multiple (3 or more) adenomas, Last                            colonoscopy: July 2021 Providers:                Jerrell KYM Sol, MD, Jacquelyn Jaci Pierce,                            RN, Coye Bade, Technician Referring MD:             Carlin Gull Medicines:                Propofol  per Anesthesia, Monitored Anesthesia Care Complications:            No immediate complications. Estimated Blood Loss:     Estimated blood loss: none. Procedure:                Pre-Anesthesia Assessment:                           - Prior to the procedure, a History and Physical                            was performed, and patient medications and                            allergies were reviewed. The patient's tolerance of                            previous anesthesia was also reviewed. The risks                            and benefits of the procedure and the sedation                            options and risks were discussed with the patient.                            All questions were answered, and informed consent                            was obtained. Prior Anticoagulants: The patient has                            taken no anticoagulant or antiplatelet agents. ASA  Grade Assessment: III - A patient with severe                            systemic disease. After reviewing the risks and                            benefits, the patient was deemed in satisfactory                            condition to undergo the procedure.                           After obtaining informed consent, the  colonoscope                            was passed under direct vision. Throughout the                            procedure, the patient's blood pressure, pulse, and                            oxygen saturations were monitored continuously. The                            [id number]-[id number] ([id number]) colonoscope was introduced                            through the anus and advanced to the the cecum,                            identified by appendiceal orifice and ileocecal                            valve. The colonoscopy was performed without                            difficulty. The patient tolerated the procedure                            well. The quality of the bowel preparation was                            fair. The terminal ileum, ileocecal valve,                            appendiceal orifice, and rectum were photographed. Scope In: 9:38:34 AM Scope Out: 9:56:28 AM Scope Withdrawal Time: 0 hours 11 minutes 59 seconds  Total Procedure Duration: 0 hours 17 minutes 54 seconds  Findings:      A 10 mm polyp was found in the hepatic flexure. The polyp was       semi-sessile. The polyp was removed with a hot snare. Resection and       retrieval were complete. Estimated blood loss: none.      A 6 mm polyp was found in the  sigmoid colon. The polyp was semi-sessile.       The polyp was removed with a hot snare. Resection and retrieval were       complete. Estimated blood loss: none.      Internal hemorrhoids were found during retroflexion. The hemorrhoids       were medium-sized and Grade I (internal hemorrhoids that do not       prolapse).      The terminal ileum appeared normal.      A moderate amount of liquid semi-liquid stool was found in the entire       colon, making visualization difficult. Lavage of the area was performed,       resulting in clearance with fair visualization.      Hemorrhoids were found on perianal exam. Impression:               - Preparation of the colon was  fair.                           - One 10 mm polyp at the hepatic flexure, removed                            with a hot snare. Resected and retrieved.                           - One 6 mm polyp in the sigmoid colon, removed with                            a hot snare. Resected and retrieved.                           - Internal hemorrhoids.                           - The examined portion of the ileum was normal.                           - Stool in the entire examined colon.                           - Hemorrhoids found on perianal exam. Moderate Sedation:      N/A - MAC procedure Recommendation:           - Patient has a contact number available for                            emergencies. The signs and symptoms of potential                            delayed complications were discussed with the                            patient. Return to normal activities tomorrow.                            Written discharge instructions were provided to the  patient.                           - High fiber diet.                           - Await pathology results.                           - Repeat colonoscopy for surveillance based on                            pathology results. Procedure Code(s):        --- Professional ---                           [phone], Colonoscopy, flexible; with removal of                            tumor(s), polyp(s), or other lesion(s) by snare                            technique Diagnosis Code(s):        --- Professional ---                           Z86.010, Personal history of colonic polyps                           D12.3, Benign neoplasm of transverse colon (hepatic                            flexure or splenic flexure)                           D12.5, Benign neoplasm of sigmoid colon                           K64.0, First degree hemorrhoids CPT copyright 2022 [employer]. All rights reserved. The codes documented in  this report are preliminary and upon coder review may  be revised to meet current compliance requirements. [name], MD 10/27/2024 10:10:51 AM This report has been signed electronically. Number of Addenda: 0

## 2024-10-27 NOTE — H&P (Signed)
 Date of Initial H&P: 10/22/24  History reviewed, patient examined, no change in status, stable for surgery.

## 2024-10-27 NOTE — Discharge Instructions (Signed)
YOU HAD AN ENDOSCOPIC PROCEDURE TODAY: Refer to the procedure report and other information in the discharge instructions given to you for any specific questions about what was found during the examination. If this information does not answer your questions, please call [facility] office at [phone] to clarify.  ? ?YOU SHOULD EXPECT: Some feelings of bloating in the abdomen. Passage of more gas than usual. Walking can help get rid of the air that was put into your GI tract during the procedure and reduce the bloating. If you had a lower endoscopy (such as a colonoscopy or flexible sigmoidoscopy) you may notice spotting of blood in your stool or on the toilet paper. Some abdominal soreness may be present for a day or two, also. ? ?DIET: Your first meal following the procedure should be a light meal and then it is ok to progress to your normal diet. A half-sandwich or bowl of soup is an example of a good first meal. Heavy or fried foods are harder to digest and may make you feel nauseous or bloated. Drink plenty of fluids but you should avoid alcoholic beverages for 24 hours.   ? ?ACTIVITY: Your care partner should take you home directly after the procedure. You should plan to take it easy, moving slowly for the rest of the day. You can resume normal activity the day after the procedure however YOU SHOULD NOT DRIVE, use power tools, machinery or perform tasks that involve climbing or major physical exertion for 24 hours (because of the sedation medicines used during the test).  ? ?SYMPTOMS TO REPORT IMMEDIATELY: ?A gastroenterologist can be reached at any hour. Please call [phone]  for any of the following symptoms:  ?Following lower endoscopy (colonoscopy, flexible sigmoidoscopy) ?Excessive amounts of blood in the stool  ?Significant tenderness, worsening of abdominal pains  ?Swelling of the abdomen that is new, acute  ?Fever of 100? or higher  ? ? ?FOLLOW UP:  ?If any biopsies were taken you will be contacted  by phone or by letter within the next 1-3 weeks. Call [phone]  if you have not heard about the biopsies in 3 weeks.  ?Please also call with any specific questions about appointments or follow up tests. YOU HAD AN ENDOSCOPIC PROCEDURE TODAY: Refer to the procedure report and other information in the discharge instructions given to you for any specific questions about what was found during the examination. If this information does not answer your questions, please call [facility] office at [phone] to clarify.   ?

## 2024-10-28 ENCOUNTER — Encounter (HOSPITAL_COMMUNITY): Payer: Self-pay | Admitting: Gastroenterology

## 2024-10-28 LAB — SURGICAL PATHOLOGY

## 2025-01-04 ENCOUNTER — Ambulatory Visit: Payer: Medicare Other

## 2025-01-04 DIAGNOSIS — I5022 Chronic systolic (congestive) heart failure: Secondary | ICD-10-CM

## 2025-01-05 LAB — CUP PACEART REMOTE DEVICE CHECK
Battery Remaining Longevity: 54 mo
Battery Remaining Percentage: 62 %
Battery Voltage: 2.96 V
Brady Statistic AP VP Percent: 13 %
Brady Statistic AP VS Percent: 1 %
Brady Statistic AS VP Percent: 84 %
Brady Statistic AS VS Percent: 2.2 %
Brady Statistic RA Percent Paced: 13 %
Date Time Interrogation Session: 20260119020121
HighPow Impedance: 72 Ohm
Implantable Lead Connection Status: 753985
Implantable Lead Connection Status: 753985
Implantable Lead Connection Status: 753985
Implantable Lead Implant Date: 20230424
Implantable Lead Implant Date: 20230424
Implantable Lead Implant Date: 20230424
Implantable Lead Location: 753858
Implantable Lead Location: 753859
Implantable Lead Location: 753860
Implantable Pulse Generator Implant Date: 20230424
Lead Channel Impedance Value: 430 Ohm
Lead Channel Impedance Value: 500 Ohm
Lead Channel Impedance Value: 600 Ohm
Lead Channel Pacing Threshold Amplitude: 0.5 V
Lead Channel Pacing Threshold Amplitude: 0.75 V
Lead Channel Pacing Threshold Amplitude: 0.75 V
Lead Channel Pacing Threshold Pulse Width: 0.5 ms
Lead Channel Pacing Threshold Pulse Width: 0.5 ms
Lead Channel Pacing Threshold Pulse Width: 0.8 ms
Lead Channel Sensing Intrinsic Amplitude: 11.8 mV
Lead Channel Sensing Intrinsic Amplitude: 5 mV
Lead Channel Setting Pacing Amplitude: 1.5 V
Lead Channel Setting Pacing Amplitude: 2 V
Lead Channel Setting Pacing Amplitude: 2.5 V
Lead Channel Setting Pacing Pulse Width: 0.5 ms
Lead Channel Setting Pacing Pulse Width: 0.8 ms
Lead Channel Setting Sensing Sensitivity: 0.5 mV
Pulse Gen Serial Number: 210001616
Zone Setting Status: 755011

## 2025-01-07 ENCOUNTER — Ambulatory Visit: Payer: Self-pay | Admitting: Cardiology

## 2025-01-08 NOTE — Progress Notes (Signed)
 Remote ICD Transmission

## 2025-04-05 ENCOUNTER — Ambulatory Visit: Payer: Medicare Other

## 2025-07-05 ENCOUNTER — Ambulatory Visit: Payer: Medicare Other
